# Patient Record
Sex: Male | Born: 1945 | Race: White | Hispanic: No | Smoking: Current every day smoker
Health system: Southern US, Community
[De-identification: ages and names within clinical notes are randomized; demographics above are authoritative.]

## PROBLEM LIST (undated history)

## (undated) DIAGNOSIS — Q6589 Other specified congenital deformities of hip: Secondary | ICD-10-CM

## (undated) DIAGNOSIS — M1990 Unspecified osteoarthritis, unspecified site: Secondary | ICD-10-CM

## (undated) DIAGNOSIS — S0592XA Unspecified injury of left eye and orbit, initial encounter: Secondary | ICD-10-CM

## (undated) DIAGNOSIS — H409 Unspecified glaucoma: Secondary | ICD-10-CM

## (undated) DIAGNOSIS — J9819 Other pulmonary collapse: Secondary | ICD-10-CM

## (undated) DIAGNOSIS — J449 Chronic obstructive pulmonary disease, unspecified: Secondary | ICD-10-CM

## (undated) DIAGNOSIS — B029 Zoster without complications: Secondary | ICD-10-CM

## (undated) DIAGNOSIS — C801 Malignant (primary) neoplasm, unspecified: Secondary | ICD-10-CM

## (undated) DIAGNOSIS — K219 Gastro-esophageal reflux disease without esophagitis: Secondary | ICD-10-CM

## (undated) DIAGNOSIS — R0683 Snoring: Secondary | ICD-10-CM

## (undated) DIAGNOSIS — E039 Hypothyroidism, unspecified: Secondary | ICD-10-CM

## (undated) DIAGNOSIS — I10 Essential (primary) hypertension: Secondary | ICD-10-CM

## (undated) HISTORY — DX: Unspecified injury of left eye and orbit, initial encounter: S05.92XA

## (undated) HISTORY — DX: Essential (primary) hypertension: I10

## (undated) HISTORY — PX: EYE SURGERY: SHX253

## (undated) HISTORY — PX: LUNG SURGERY: SHX703

## (undated) HISTORY — DX: Other specified congenital deformities of hip: Q65.89

## (undated) HISTORY — DX: Zoster without complications: B02.9

## (undated) HISTORY — DX: Other pulmonary collapse: J98.19

## (undated) HISTORY — DX: Unspecified osteoarthritis, unspecified site: M19.90

## (undated) HISTORY — DX: Snoring: R06.83

## (undated) HISTORY — DX: Unspecified glaucoma: H40.9

## (undated) HISTORY — DX: Gastro-esophageal reflux disease without esophagitis: K21.9

---

## 1978-06-09 DIAGNOSIS — J9819 Other pulmonary collapse: Secondary | ICD-10-CM

## 1978-06-09 HISTORY — DX: Other pulmonary collapse: J98.19

## 2007-05-20 ENCOUNTER — Ambulatory Visit: Payer: Self-pay | Admitting: Internal Medicine

## 2007-05-20 ENCOUNTER — Inpatient Hospital Stay (HOSPITAL_COMMUNITY): Admission: EM | Admit: 2007-05-20 | Discharge: 2007-05-21 | Payer: Self-pay | Admitting: Emergency Medicine

## 2007-05-21 ENCOUNTER — Ambulatory Visit: Payer: Self-pay | Admitting: Vascular Surgery

## 2007-05-21 ENCOUNTER — Encounter (INDEPENDENT_AMBULATORY_CARE_PROVIDER_SITE_OTHER): Payer: Self-pay | Admitting: Internal Medicine

## 2010-10-22 NOTE — H&P (Signed)
Donald Larson, Donald Larson               ACCOUNT NO.:  [id number]   MEDICAL RECORD NO.:  [id number]          PATIENT TYPE:  EMS   LOCATION:  MAJO                         FACILITY:  [facility]   PHYSICIAN:  Marcellus Scott, MD     DATE OF BIRTH:  01-28-1946   DATE OF ADMISSION:  05/20/2007  DATE OF DISCHARGE:                              HISTORY & PHYSICAL   PRIMARY MEDICAL DOCTOR:  Unassigned.   CHIEF COMPLAINT:  1. Passing out.  2. Question chest pain.   HISTORY OF PRESENTING ILLNESS:  Donald Larson is a 65 year old pleasant  Caucasian male patient with no past medical history.  He was in his  usual state of health until approximately 63-4:30 p.m. this afternoon.  While he was at his niece's place, the patient was sitting on a stool  when he felt dizzy, light-headed, with some lower retrosternal chest  pain.  Following this, soon afterward the patient passed out and fell to  the ground, when he hit his left cheek.  There was a chronic swelling on  that left cheek which is said to have burst with some bleeding.  The  patient woke up within a few minutes.  The chest pain had subsided.  There was no diaphoresis, no asymmetrical limb weakness or change in  speech.  The patient then requested his niece and her son to walk him  outside for some fresh air.  While on the porch, the patient again felt  dizzy and collapsed, when he hit his tailbone.  He was out for a couple  of minutes.  When he woke up, he felt weak.  EMS was called, and the  patient was brought in.  There were no orthostatics recorded.  However,  his blood sugars were 69 mg/dL.  The patient at this time says he is  feeling much better, with no lightheadedness, dizziness, chest pain,  palpitations, diaphoresis, nausea, or vomiting.  He denies any  asymmetrical limb weakness or headache.  He does complain of pain in the  tailbone.   The patient indicates that he had had two beers this morning.  However,  he had had a regular breakfast  and lunch, too.  The patient also had  four beers last night.   PAST MEDICAL HISTORY:  None.   PAST SURGICAL HISTORY:  Left eye surgery secondary to metal pieces in  the left eye.  The patient is blind in the left eye.   ALLERGIES:  No known drug allergies.   MEDICATIONS:  None.   FAMILY HISTORY:  Sister with history of congestive heart failure,  coronary artery disease.  Brother with history of diabetes.  Mother and  sister with history of diabetes.  Strong family history of cancer,  including throat, lung cancers.   SOCIAL HISTORY:  The patient is widowed.  He is a Corporate investment banker.  He smokes 3-4 packs per day for decades.  The patient claims he drinks  only on weekends, which is 10-12, 16-ounce beers per day on Fridays and  Saturdays.  He claims sometimes he also uses liquor.  Though he claims  he drinks only on weekends, now he says he had four beers yesterday and  two this morning.  There is no history of drug abuse.   REVIEW OF SYSTEMS:  Fourteen systems were reviewed, and apart from  history of presenting illness, it is contributory from some chronic  nonproductive cough, no dyspnea, fever, chills, or rigors.   PHYSICAL EXAMINATION:  GENERAL:  Donald Larson is a moderately built and  nourished male patient in no obvious distress.  VITAL SIGNS:  Temperature 97.1 degrees Fahrenheit, blood pressure is  110/54 mmHg, pulse 60 per minute and regular, respirations 18 per  minute, saturating at 97% on room air.  HEENT:  Nontraumatic, normocephalic.  Right pupil is 3 mm, reacting to  light.  Left corneal opacity and blind.  The patient has a pedunculous  swelling on the left cheek underneath the eye which has a tiny cut and  is minimally oozing blood.  It is nontender.  Oral cavity with no  oropharyngeal erythema or thrush.  NECK:  Without JVD, carotid bruits, lymphadenopathy, or goiter.  Supple.  LYMPHATICS:  No lymphadenopathy.  RESPIRATORY:  Clear to auscultation  bilaterally.  CARDIOVASCULAR:  First and second heart sounds heard.  Soft.  No third  or fourth heart sounds or murmurs.  ABDOMEN:  Nontender, nondistended.  No organomegaly or mass appreciated.  Bowel sounds are normally heard.  CENTRAL NERVOUS SYSTEM:  The patient is awake, alert, oriented x3.  No  cranial nerve or focal neurological deficits.  EXTREMITIES:  With no cyanosis, clubbing, or edema.  Peripheral pulses  are symmetrically felt.  SKIN:  Without any rashes.  MUSCULOSKELETAL:  With minimal tenderness in the sacrococcygeal region,  but no deformities.   LABORATORY DATA:  Blood alcohol level is normal, less than 5.  The  patient's electrolytes on i-STAT:  Sodium 141, potassium 3.6, chloride  108, glucose 68, BUN 11, creatinine 1.  CBCs with hemoglobin 14.1,  hematocrit 42, white blood cells 10.8, platelets 194.  EKG is sinus  bradycardia, with rate of 58 beats per minute, with sinus arrhythmia.  PR and QT interval are normal.  No acute changes on EKG.   ASSESSMENT AND PLAN:  1. Syncope.  Unclear etiology.  Differential diagnosis is hypotension      secondary to alcohol use and associated dehydration, versus      vasovagal, versus hypoglycemia.  Will admit to telemetry.  Will      cycle cardiac enzymes.  Will obtain an echocardiogram to check LV      function, and also carotid Dopplers.  2. Chest pain.  Resolved.  Rule out acute coronary syndrome.  Will      cycle cardiac enzymes and place the patient on aspirin after the CT      head has been done and ruled out for a bleed.  Will also check an      echocardiogram.  3. Tobacco abuse.  For counseling.  The patient, however, indicates he      does not to quit.  For nicotine patch.  4. Alcohol dependence.  To monitor for withdrawal, and place the      patient on Ativan withdrawal protocol, thiamine, and multivitamin.      Marcellus Scott, MD  Electronically Signed     AH/MEDQ  D:  05/20/2007  T:  05/20/2007  Job:   [id number]

## 2010-10-25 NOTE — Discharge Summary (Signed)
[name], Donald Larson               ACCOUNT NO.:  [id number]   MEDICAL RECORD NO.:  [id number]          PATIENT TYPE:  INP   LOCATION:  4733                         FACILITY:  [facility]   PHYSICIAN:  Lucita Ferrara, MD         DATE OF BIRTH:  12/10/1945   DATE OF ADMISSION:  05/20/2007  DATE OF DISCHARGE:  05/21/2007                               DISCHARGE SUMMARY   DISCHARGE DIAGNOSES:  1. Syncope.  2. Chest pain.  3. Tobacco abuse.  4. Alcohol dependence.  5. Noncompliance.  6. Left hospital against medical advice.   CONSULTANTS:  None.   PROCEDURES:  1. 2-D echocardiogram.  2. Carotid Dopplers within normal limits, dated May 21, 2007.   BRIEF HISTORY OF PRESENT ILLNESS:  [name] is a 65 year old  Caucasian male who presented to [facility] on May 20, 2007, with dizziness, lightheadedness and passed out, fell to the  ground.  Apparently he had some swelling of his left cheek.  EMS was  called.  The patient was brought to the emergency room.  A detailed  workup was initiated.  Syncope workup was initiated with serial enzymes,  2-D echocardiogram.  For his chest pain the patient's serial enzymes  were initiated.  CT scan of the head was negative for bleed.  Echocardiogram was requested.  Tobacco abuse counseling was initiated.  The patient was put on an alcohol withdrawal protocol.  Prior to me  seeing the patient on May 21, 2007, when I took over the case, the  patient requested to take his IV out and stated to the nurse that, I  need to get out of here.  The patient was encouraged to stay to talk to  me.  Prior to me seeing him, examining him and finalizing his workup,  the patient left against medical advice on May 21, 2007, 1540  military time.      Lucita Ferrara, MD  Electronically Signed     RR/MEDQ  D:  08/25/2007  T:  08/25/2007  Job:  [id number]

## 2011-03-17 LAB — COMPREHENSIVE METABOLIC PANEL
Albumin: 3.4 — ABNORMAL LOW
Alkaline Phosphatase: 56
CO2: 25
Chloride: 111
GFR calc Af Amer: >60
GFR calc non Af Amer: >60
Sodium: 142
Total Protein: 6.1

## 2011-03-17 LAB — I-STAT 8, (EC8 V) (CONVERTED LAB)
Bicarbonate: 25.7 — ABNORMAL HIGH
Glucose, Bld: 68 — ABNORMAL LOW
Potassium: 3.6
Sodium: 141
TCO2: 27

## 2011-03-17 LAB — CARDIAC PANEL(CRET KIN+CKTOT+MB+TROPI)
CK, MB: 1.7
Relative Index: INVALID
Relative Index: INVALID
Total CK: 95

## 2011-03-17 LAB — CBC
HCT: 38.4 — ABNORMAL LOW
HCT: 42
Hemoglobin: 13.5
Hemoglobin: 14.1
MCV: 96.4
Platelets: 194
RBC: 4.09 — ABNORMAL LOW
WBC: 6.9

## 2011-03-17 LAB — LIPID PANEL: Cholesterol: 150

## 2011-03-17 LAB — DIFFERENTIAL
Eosinophils Absolute: 0.2
Monocytes Absolute: 0.8
Neutrophils Relative %: 81 — ABNORMAL HIGH

## 2011-03-17 LAB — TSH: TSH: 1.146

## 2011-03-17 LAB — ETHANOL: Alcohol, Ethyl (B): <5

## 2014-05-09 ENCOUNTER — Ambulatory Visit (INDEPENDENT_AMBULATORY_CARE_PROVIDER_SITE_OTHER): Payer: Medicare Other | Admitting: *Deleted

## 2014-05-09 ENCOUNTER — Ambulatory Visit (INDEPENDENT_AMBULATORY_CARE_PROVIDER_SITE_OTHER): Payer: Medicare Other | Admitting: Internal Medicine

## 2014-05-09 ENCOUNTER — Encounter: Payer: Self-pay | Admitting: Internal Medicine

## 2014-05-09 VITALS — BP 171/76 | HR 75 | Temp 98.4°F | Ht 71.0 in | Wt 151.5 lb

## 2014-05-09 DIAGNOSIS — M5441 Lumbago with sciatica, right side: Secondary | ICD-10-CM

## 2014-05-09 DIAGNOSIS — M5440 Lumbago with sciatica, unspecified side: Secondary | ICD-10-CM | POA: Diagnosis not present

## 2014-05-09 DIAGNOSIS — Z23 Encounter for immunization: Secondary | ICD-10-CM | POA: Diagnosis not present

## 2014-05-09 DIAGNOSIS — M5416 Radiculopathy, lumbar region: Secondary | ICD-10-CM

## 2014-05-09 DIAGNOSIS — M5442 Lumbago with sciatica, left side: Secondary | ICD-10-CM | POA: Diagnosis not present

## 2014-05-09 DIAGNOSIS — Z0000 Encounter for general adult medical examination without abnormal findings: Secondary | ICD-10-CM

## 2014-05-09 DIAGNOSIS — M5430 Sciatica, unspecified side: Secondary | ICD-10-CM | POA: Diagnosis not present

## 2014-05-09 DIAGNOSIS — G8929 Other chronic pain: Secondary | ICD-10-CM | POA: Insufficient documentation

## 2014-05-09 LAB — CBC
HEMATOCRIT: 39.1 % (ref 39.0–52.0)
Hemoglobin: 13.7 g/dL (ref 13.0–17.0)
MCH: 32.5 pg (ref 26.0–34.0)
MCHC: 35 g/dL (ref 30.0–36.0)
MCV: 92.9 fL (ref 78.0–100.0)
MPV: 9.8 fL (ref 9.4–12.4)
PLATELETS: 200 10*3/uL (ref 150–400)
RBC: 4.21 MIL/uL — ABNORMAL LOW (ref 4.22–5.81)
RDW: 14.1 % (ref 11.5–15.5)
WBC: 8 10*3/uL (ref 4.0–10.5)

## 2014-05-09 LAB — COMPLETE METABOLIC PANEL WITH GFR
ALT: 10 U/L (ref 0–53)
AST: 18 U/L (ref 0–37)
Albumin: 4 g/dL (ref 3.5–5.2)
Alkaline Phosphatase: 65 U/L (ref 39–117)
BILIRUBIN TOTAL: 0.4 mg/dL (ref 0.2–1.2)
BUN: 17 mg/dL (ref 6–23)
CHLORIDE: 104 meq/L (ref 96–112)
CO2: 30 mEq/L (ref 19–32)
CREATININE: 0.82 mg/dL (ref 0.50–1.35)
Calcium: 8.7 mg/dL (ref 8.4–10.5)
GFR, Est Non African American: >89 mL/min
Glucose, Bld: 103 mg/dL — ABNORMAL HIGH (ref 70–99)
POTASSIUM: 4.3 meq/L (ref 3.5–5.3)
SODIUM: 141 meq/L (ref 135–145)
Total Protein: 6.6 g/dL (ref 6.0–8.3)

## 2014-05-09 MED ORDER — OMEPRAZOLE 40 MG PO CPDR: 40.0000 mg | DELAYED_RELEASE_CAPSULE | Freq: Every day | ORAL | Status: DC

## 2014-05-09 MED ORDER — OXYCODONE HCL 10 MG PO TABS: 10.0000 mg | ORAL_TABLET | Freq: Three times a day (TID) | ORAL | Status: DC | PRN

## 2014-05-09 NOTE — Progress Notes (Addendum)
Patient ID: Donald Larson, male   DOB: [birth year], 68 y.o.   MRN: [id number]   Subjective:   Patient ID: Donald Larson male   DOB: 1946/03/31 67 y.o.   MRN: [id number]  HPI: Donald Larson is a 68 y.o. without past medical hx.  Presented today with chronic low back pain- started about 8 years agos which has been getting worse now affecting his sleep. Pt pain started 8 years ago when he was climbing a ladder with some heavy equipment when he turned suddenly and felt his back twist with a sound, since then patient has been having pain, pt still presently works in Architect. Pain is constant, severe, aggrav by walking long distances, climbing up stairs, has to stop and rest for some relief, this takes the pressure off his back. Weight has been stable, back and forth betwwen 10 pounds. Pt also has weakness of his lower extremities, sometimes has to hold on to something to walk. Also has shooting pains down both legs, worse on the right. Pt also has numbness of both legs, getting worse over the past 3-4 years. Pt also denies saddle anaethesia, bladder or bowlel problems. No fevers.  Pt has gotten pain pills from friends- ?10mg  he remembers, not sure of the name, helps for about 3- 4 hrs. Pt tried over the counter NSAIDs several times and they did not help,. So he stopped taking them. Tried some last week. Has not seen a doctor in 4 years, but was last admitted here in 2008 for syncope but he left AMA.  Pt endorses abdominal pain- 2-3 times a week, with nausea. Last threw up yesterday, can not remember the color did not check. Pain is intermitent. No known aggrav or relieving factors. No pain at this time. Stools are normal brown colour.  Pt did not seek medical care prior to this time, and is doing so now, because pt with the help of his sister who is present with him, just completed paper work for [employer].  No past medical history on file. No current outpatient prescriptions on file.   No  current facility-administered medications for this visit.   No family history on file. History   Social History  . Marital Status: Married    Spouse Name: N/A    Number of Children: N/A  . Years of Education: N/A   Social History Main Topics  . Smoking status: Current Every Day Smoker  . Smokeless tobacco: Not on file     Comment: 2PPD  . Alcohol Use: Not on file  . Drug Use: Not on file  . Sexual Activity: Not on file   Other Topics Concern  . Not on file   Social History Narrative  . No narrative on file   Review of Systems: CONSTITUTIONAL- No Fever, weightloss, night sweat or change in appetite. SKIN- No Rash, colour changes or itching. HEAD- No Headache or dizziness. EYES- Vision loss- left eye, had previous surgery that was not followed up on. EARS- No vertigo, hearing loss or ear discharge. Mouth/throat- No Sorethroat, dentures, or bleeding gums. RESPIRATORY- mild chronic Cough or no SOB. CARDIAC- No Palpitations, DOE, PND or chest pain. GI- No nausea, vomiting, diarrhoea, constipation, abd pain. URINARY- No Frequency, urgency, straining or dysuria. NEUROLOGIC- No Numbness, syncope, seizures or burning. [facility]- Denies depression or anxiety.  Objective:  Physical Exam: Filed Vitals:   05/09/14 1607  BP: 171/76  Pulse: 75  Temp: 98.4 F (36.9 C)  TempSrc: Oral  Height: 5'  11" (1.803 m)  Weight: 151 lb 8 oz (68.72 kg)  SpO2: 96%   GENERAL- alert, co-operative, appears as stated age, not in any distress. HEENT- Atraumatic, normocephalic, Left eye- Corneal opacity, with light perception only, oral mucosa appears moist, poor dentition overall, No carotid bruit, no cervical LN enlargement, thyroid does not appear enlarged, neck supple. CARDIAC- RRR, no murmurs, rubs or gallops. RESP- Moving equal volumes of air, and clear to auscultation bilaterally, no wheezes or crackles. ABDOMEN- Soft, nontender, no palpable masses or organomegaly, bowel sounds present. BACK-  Significant lumber kyphosis,  No tenderness along the vertebrae midline, some tenderness on palpation to the right of lumber spine,  NEURO- No obvious Cr N abnormality, strenght upper  5/5, Sensation intact- globally, DTRs- Patella- +2, Gait- kyphotic but normal. EXTREMITIES- pulse 2+, symmetric, no pedal edema. SKIN- Warm, dry, No rash or lesion. PSYCH- Normal mood and affect, appropriate thought content and speech.  Assessment & Plan:  The patient's case and plan of care was discussed with attending physician, Dr. Dareen Piano.  Please see problem based charting for assessment and plan.

## 2014-05-09 NOTE — Addendum Note (Signed)
Addended by: Bethena Roys on: 05/09/2014 06:26 PM   Modules accepted: Level of Service

## 2014-05-09 NOTE — Patient Instructions (Signed)
General Instructions: We want you to get this MRI done as soon as possible.   For now continue with the Oxycodone we are given you take One tablets as needed every 8 hours for pain.  Also take the medication for we prescribed for your stomach. Please do not take ibuprofen, Advil or Aleve, motrin or Naproxen any more.   Please bring your medicines with you each time you come to clinic.  Medicines may include prescription medications, over-the-counter medications, herbal remedies, eye drops, vitamins, or other pills.

## 2014-05-09 NOTE — Assessment & Plan Note (Signed)
Flu shot today.   Patient will like to come in for full work up after we have sorted out the issue with his back. Including lipid panel, Colonoscopy, Pneumovax and TDAp.

## 2014-05-09 NOTE — Assessment & Plan Note (Addendum)
Pt with hx and exam findings concerning for Disc prolapse/herniation, cord compression. No prior imaging. No alarm feats. Pain is worse at night interfering with sleep. Significant smoking hx. No weightloss. No alarm feats.   Plan- MRI spine - Oxycodone- 10mg  daily - will call in Gabapentin 300mg  BID. - Avoid NSAIDs. - CBC - CMET - Bp elevated today, but pt is in significant pain, recheck 2 more visits when acute issues resolved, pt might need BP meds. - Preferred pharm on [address].

## 2014-05-10 NOTE — Progress Notes (Signed)
INTERNAL MEDICINE TEACHING ATTENDING ADDENDUM - Jhanae Jaskowiak, MD: I reviewed and discussed at the time of visit with the resident Dr. Emokpae, the patient's medical history, physical examination, diagnosis and results of pertinent tests and treatment and I agree with the patient's care as documented.  

## 2014-05-24 ENCOUNTER — Telehealth: Payer: Self-pay | Admitting: *Deleted

## 2014-06-13 ENCOUNTER — Ambulatory Visit (HOSPITAL_COMMUNITY)
Admission: RE | Admit: 2014-06-13 | Discharge: 2014-06-13 | Disposition: A | Discharge status: Home / Self Care | Payer: Medicare Other | Source: Ambulatory Visit | Attending: Internal Medicine | Admitting: Internal Medicine

## 2014-06-13 ENCOUNTER — Other Ambulatory Visit: Payer: Self-pay | Admitting: *Deleted

## 2014-06-13 DIAGNOSIS — M5137 Other intervertebral disc degeneration, lumbosacral region: Secondary | ICD-10-CM | POA: Insufficient documentation

## 2014-06-13 DIAGNOSIS — M5136 Other intervertebral disc degeneration, lumbar region: Secondary | ICD-10-CM | POA: Diagnosis not present

## 2014-06-13 DIAGNOSIS — M5441 Lumbago with sciatica, right side: Secondary | ICD-10-CM

## 2014-06-13 DIAGNOSIS — M47896 Other spondylosis, lumbar region: Secondary | ICD-10-CM | POA: Diagnosis not present

## 2014-06-13 DIAGNOSIS — M5126 Other intervertebral disc displacement, lumbar region: Secondary | ICD-10-CM | POA: Insufficient documentation

## 2014-06-13 DIAGNOSIS — M47816 Spondylosis without myelopathy or radiculopathy, lumbar region: Secondary | ICD-10-CM | POA: Diagnosis not present

## 2014-06-13 DIAGNOSIS — G8929 Other chronic pain: Secondary | ICD-10-CM | POA: Diagnosis not present

## 2014-06-13 DIAGNOSIS — M5442 Lumbago with sciatica, left side: Secondary | ICD-10-CM

## 2014-06-13 DIAGNOSIS — R200 Anesthesia of skin: Secondary | ICD-10-CM | POA: Diagnosis not present

## 2014-06-13 DIAGNOSIS — M25551 Pain in right hip: Secondary | ICD-10-CM | POA: Diagnosis not present

## 2014-06-13 DIAGNOSIS — R531 Weakness: Secondary | ICD-10-CM | POA: Insufficient documentation

## 2014-06-13 DIAGNOSIS — M5144 Schmorl's nodes, thoracic region: Secondary | ICD-10-CM | POA: Diagnosis not present

## 2014-06-13 DIAGNOSIS — M2578 Osteophyte, vertebrae: Secondary | ICD-10-CM | POA: Diagnosis not present

## 2014-06-13 DIAGNOSIS — M545 Low back pain: Secondary | ICD-10-CM | POA: Diagnosis not present

## 2014-06-13 DIAGNOSIS — M4806 Spinal stenosis, lumbar region: Secondary | ICD-10-CM | POA: Insufficient documentation

## 2014-06-13 MED ORDER — OXYCODONE HCL 10 MG PO TABS: 10.0000 mg | ORAL_TABLET | Freq: Three times a day (TID) | ORAL | Status: DC | PRN

## 2014-06-13 NOTE — Telephone Encounter (Signed)
Given 10 pills to last until his appointment with me.

## 2014-06-13 NOTE — Telephone Encounter (Signed)
Pt left clinic 4PM - left message on home phone Rx for med was ready and clinic only open till 4:30PM. Also left message  he has appt with Dr Heber Sherman 06/15/13 3:15PM per Dr Heber Pearlington. Hilda Blades Aiyannah Fayad RN 06/13/14 4:05PM

## 2014-06-15 ENCOUNTER — Ambulatory Visit (INDEPENDENT_AMBULATORY_CARE_PROVIDER_SITE_OTHER): Payer: Medicare Other | Admitting: Internal Medicine

## 2014-06-15 ENCOUNTER — Encounter: Payer: Self-pay | Admitting: Internal Medicine

## 2014-06-15 ENCOUNTER — Other Ambulatory Visit: Payer: Self-pay | Admitting: Internal Medicine

## 2014-06-15 VITALS — BP 164/70 | HR 61 | Temp 97.9°F | Ht 70.0 in | Wt 151.6 lb

## 2014-06-15 DIAGNOSIS — M48061 Spinal stenosis, lumbar region without neurogenic claudication: Secondary | ICD-10-CM

## 2014-06-15 DIAGNOSIS — M5416 Radiculopathy, lumbar region: Secondary | ICD-10-CM

## 2014-06-15 DIAGNOSIS — G8929 Other chronic pain: Secondary | ICD-10-CM

## 2014-06-15 DIAGNOSIS — I10 Essential (primary) hypertension: Secondary | ICD-10-CM | POA: Diagnosis not present

## 2014-06-15 DIAGNOSIS — Z23 Encounter for immunization: Secondary | ICD-10-CM

## 2014-06-15 DIAGNOSIS — Z0000 Encounter for general adult medical examination without abnormal findings: Secondary | ICD-10-CM

## 2014-06-15 DIAGNOSIS — F17200 Nicotine dependence, unspecified, uncomplicated: Secondary | ICD-10-CM

## 2014-06-15 DIAGNOSIS — F17210 Nicotine dependence, cigarettes, uncomplicated: Secondary | ICD-10-CM | POA: Diagnosis not present

## 2014-06-15 MED ORDER — PNEUMOCOCCAL 13-VAL CONJ VACC IM SUSP
0.5000 mL | INTRAMUSCULAR | Status: AC
  Administered 2014-06-15: 0.5 mL via INTRAMUSCULAR

## 2014-06-15 MED ORDER — OXYCODONE-ACETAMINOPHEN 5-325 MG PO TABS: 1.0000 | ORAL_TABLET | Freq: Three times a day (TID) | ORAL | Status: DC | PRN

## 2014-06-15 MED ORDER — IBUPROFEN 800 MG PO TABS: 800.0000 mg | ORAL_TABLET | Freq: Three times a day (TID) | ORAL | Status: DC | PRN

## 2014-06-15 MED ORDER — HYDROCHLOROTHIAZIDE 25 MG PO TABS: 25.0000 mg | ORAL_TABLET | Freq: Every day | ORAL | Status: DC

## 2014-06-15 NOTE — Progress Notes (Signed)
[facility] Subjective:   Patient ID: Donald Larson male   DOB: 11/14/1945 69 y.o.   MRN: [id number]  HPI: Donald Larson is a 69 y.o. male with a PMH of low back pain presents for follow up. He recently established with our clinic 2 weeks ago after not seeing a physician in many years.  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  He notes that about 10-12 years ago while putting sidding on a house he fell off a later and hurt his back, he notes the back pain was initially responsive to NSAIDs like ibuprofen but over the years it became more resistant.  More recently over the past few years (maybe about 3-4) he notes that he has been having pain that shoots down his right leg to his toe that is very painful.  He would occasionally borrow other people's pain medication for relief until he finally went to see a doctor himself.  He reports the pain can but up to a 9/10, at his last visit he was given 20 pills of Oxycodone 10mg , he notes that does help and brings the pain down to a 5-6/10.  He has not tried NSAIDs again in some time due to lack of effectiveness. 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   He denies any foot drop, any loss of bowel or bladder function, any weakness in his foot.  He does not know of any other medical problems He has never been told he has high blood pressure.  He reports a family history of cancer but unsure what type  He smokes 2-3 ppd, started when he was 65.  No past medical history on file. Current Outpatient Prescriptions  Medication Sig Dispense Refill  . omeprazole (PRILOSEC) 40 MG capsule Take 1 capsule (40 mg total) by mouth daily. 30 capsule 2  . Oxycodone HCl 10 MG TABS Take 1 tablet (10 mg total) by mouth every 8 (eight) hours as needed. 10 tablet 0   No current  facility-administered medications for this visit.   No family history on file. History   Social History  . Marital Status: Married    Spouse Name: N/A    Number of Children: N/A  . Years of Education: N/A   Social History Main Topics  . Smoking status: Current Every Day Smoker -- 3.00 packs/day    Types: Cigarettes    Start date: 06/09/1958  . Smokeless tobacco: None     Comment: 2PPD  . Alcohol Use: None  . Drug Use: None  . Sexual Activity: None   Other Topics Concern  . None   Social History Narrative   Review of Systems: Review of Systems  Constitutional: Negative for fever, chills, weight loss and malaise/fatigue.  Eyes: Negative for blurred vision.  Respiratory: Negative for shortness of breath.   Cardiovascular: Negative for chest pain.  Gastrointestinal: Negative for abdominal pain.  Genitourinary: Negative for dysuria.  Musculoskeletal: Positive for back pain.  Neurological: Negative for headaches.  Psychiatric/Behavioral: Negative for depression and substance abuse.       Does admit occasional marijuana use     Objective:  Physical Exam: Filed Vitals:   06/15/14 1529  BP: 164/70  Pulse: 61  Temp: 97.9 F (36.6 C)  TempSrc: Oral  Height: 5\' 10"  (1.778 m)  Weight: 151 lb 9.6 oz (  68.765 kg)  SpO2: 98%  Physical Exam  Constitutional: He is oriented to person, place, and time and well-developed, well-nourished, and in no distress.  Cardiovascular: Normal rate, regular rhythm and normal heart sounds.   Pulmonary/Chest: Effort normal and breath sounds normal. No respiratory distress. He has no wheezes.  Abdominal: Soft. Bowel sounds are normal. There is no tenderness.  Musculoskeletal:       Lumbar back: He exhibits tenderness. He exhibits normal range of motion, no bony tenderness, no swelling and no edema.  Positive straight leg raise to 40 degrees on the right, negative on the left.  Able to stand on toes and heel. 5/5 muscle strength of hip, knee,  ankle bilaterally, sensation intact over L4-S1  dermatomes bilaterally  Neurological: He is alert and oriented to person, place, and time. He displays normal reflexes.  Psychiatric: Mood and affect normal.  Nursing note and vitals reviewed.   Assessment & Plan:  Case discussed with Dr. Eppie Gibson  Essential hypertension Patient has had chronic pain so his hypertension on the past two episdoes is unlikely to just be due to pain. - Will start HCTZ 25mg  per day. - Check BMP at next visit for K+  Tobacco use disorder  Assessment: Progress toward smoking cessation:    Barriers to progress toward smoking cessation:    Comments: current smoker, in precontemplation  Plan: Instruction/counseling given:  I counseled patient on the dangers of tobacco use, advised patient to stop smoking, and reviewed strategies to maximize success. Educational resources provided:  [employer] account manager) brochure Self management tools provided:    Medications to assist with smoking cessation:  None Patient agreed to the following self-care plans for smoking cessation:    Other plans: Let patient know of health risks and advised to let me know when he is ready to quit.    Preventative health care - Will need some work to get him up to date on his health maintenance. - Gave Prevnar 66 today, in 6 months will need pneumococcal. - Briefly discussed colonoscopy, will readdress at later visit. - Discussed the nature of prostate screening with DRE and lab values and the potential need for invasive sampling to follow up a positive test.  He declines prostate screening at this time.  Chronic radicular lumbar pain Patient clearly has a very chronic low back pain with radicular symptoms but does not have any emergent finding suggesting that he have urgent NeuroSx referral.  I disscussed with him his MRI findings as well as the nature of this disease. We agreed to try medical management at this time and hold off  on any NeuroSx consult.  We will try to improve his function status so that he can get outside more and do more work. - Given he has had some previous relief with Ibuprofen and has normal Kidney function will start Ibuprofen 800mg  TIDPRN for moderate pain - Only red flag behavior was borrowing medication which was likely secondary to untreated pain, Will start Percocet 5-325 Q8PRN for severe pain #90 per month (he had not picked up temporary script for #10 oxycodone yet and this was voided and destroyed). Patient signed Pain contract. - Lab had closed by end of visit. Will check UDS at next visit.     Medications Ordered Meds ordered this encounter  Medications  . pneumococcal 13-valent conjugate vaccine (PREVNAR 13) injection 0.5 mL    Sig:   . ibuprofen (ADVIL,MOTRIN) 800 MG tablet    Sig: Take 1 tablet (800 mg total)  by mouth every 8 (eight) hours as needed.    Dispense:  90 tablet    Refill:  1  . oxyCODONE-acetaminophen (ROXICET) 5-325 MG per tablet    Sig: Take 1 tablet by mouth every 8 (eight) hours as needed for severe pain.    Dispense:  90 tablet    Refill:  0    May fill on 06/15/13  . hydrochlorothiazide (HYDRODIURIL) 25 MG tablet    Sig: Take 1 tablet (25 mg total) by mouth daily.    Dispense:  30 tablet    Refill:  5   Other Orders No orders of the defined types were placed in this encounter.

## 2014-06-15 NOTE — Patient Instructions (Signed)
General Instructions:   Please bring your medicines with you each time you come to clinic.  Medicines may include prescription medications, over-the-counter medications, herbal remedies, eye drops, vitamins, or other pills.   Progress Toward Treatment Goals:  No flowsheet data found.  Self Care Goals & Plans:  Self Care Goal 06/15/2014  Manage my medications take my medicines as prescribed; bring my medications to every visit  Eat healthy foods drink diet soda or water instead of juice or soda; eat more vegetables; eat foods that are low in salt; eat baked foods instead of fried foods    No flowsheet data found.   Care Management & Community Referrals:  No flowsheet data found.    Hydrochlorothiazide, HCTZ capsules or tablets What is this medicine? HYDROCHLOROTHIAZIDE (hye droe klor oh THYE a zide) is a diuretic. It increases the amount of urine passed, which causes the body to lose salt and water. This medicine is used to treat high blood pressure. It is also reduces the swelling and water retention caused by various medical conditions, such as heart, liver, or kidney disease. This medicine may be used for other purposes; ask your health care provider or pharmacist if you have questions. COMMON BRAND NAME(S): Esidrix, Ezide, HydroDIURIL, Microzide, Oretic, Zide What should I tell my health care provider before I take this medicine? They need to know if you have any of these conditions: -diabetes -gout -immune system problems, like lupus -kidney disease or kidney stones -liver disease -pancreatitis -small amount of urine or difficulty passing urine -an unusual or allergic reaction to hydrochlorothiazide, sulfa drugs, other medicines, foods, dyes, or preservatives -pregnant or trying to get pregnant -breast-feeding How should I use this medicine? Take this medicine by mouth with a glass of water. Follow the directions on the prescription label. Take your medicine at regular  intervals. Remember that you will need to pass urine frequently after taking this medicine. Do not take your doses at a time of day that will cause you problems. Do not stop taking your medicine unless your doctor tells you to. Talk to your pediatrician regarding the use of this medicine in children. Special care may be needed. Overdosage: If you think you have taken too much of this medicine contact a poison control center or emergency room at once. NOTE: This medicine is only for you. Do not share this medicine with others. What if I miss a dose? If you miss a dose, take it as soon as you can. If it is almost time for your next dose, take only that dose. Do not take double or extra doses. What may interact with this medicine? -cholestyramine -colestipol -digoxin -dofetilide -lithium -medicines for blood pressure -medicines for diabetes -medicines that relax muscles for surgery -other diuretics -steroid medicines like prednisone or cortisone This list may not describe all possible interactions. Give your health care provider a list of all the medicines, herbs, non-prescription drugs, or dietary supplements you use. Also tell them if you smoke, drink alcohol, or use illegal drugs. Some items may interact with your medicine. What should I watch for while using this medicine? Visit your doctor or health care professional for regular checks on your progress. Check your blood pressure as directed. Ask your doctor or health care professional what your blood pressure should be and when you should contact him or her. You may need to be on a special diet while taking this medicine. Ask your doctor. Check with your doctor or health care professional if you get  an attack of severe diarrhea, nausea and vomiting, or if you sweat a lot. The loss of too much body fluid can make it dangerous for you to take this medicine. You may get drowsy or dizzy. Do not drive, use machinery, or do anything that needs  mental alertness until you know how this medicine affects you. Do not stand or sit up quickly, especially if you are an older patient. This reduces the risk of dizzy or fainting spells. Alcohol may interfere with the effect of this medicine. Avoid alcoholic drinks. This medicine may affect your blood sugar level. If you have diabetes, check with your doctor or health care professional before changing the dose of your diabetic medicine. This medicine can make you more sensitive to the sun. Keep out of the sun. If you cannot avoid being in the sun, wear protective clothing and use sunscreen. Do not use sun lamps or tanning beds/booths. What side effects may I notice from receiving this medicine? Side effects that you should report to your doctor or health care professional as soon as possible: -allergic reactions such as skin rash or itching, hives, swelling of the lips, mouth, tongue, or throat -changes in vision -chest pain -eye pain -fast or irregular heartbeat -feeling faint or lightheaded, falls -gout attack -muscle pain or cramps -pain or difficulty when passing urine -pain, tingling, numbness in the hands or feet -redness, blistering, peeling or loosening of the skin, including inside the mouth -unusually weak or tired Side effects that usually do not require medical attention (report to your doctor or health care professional if they continue or are bothersome): -change in sex drive or performance -dry mouth -headache -stomach upset This list may not describe all possible side effects. Call your doctor for medical advice about side effects. You may report side effects to FDA at 1-800-FDA-1088. Where should I keep my medicine? Keep out of the reach of children. Store at room temperature between 15 and 30 degrees C (59 and 86 degrees F). Do not freeze. Protect from light and moisture. Keep container closed tightly. Throw away any unused medicine after the expiration date. NOTE: This  sheet is a summary. It may not cover all possible information. If you have questions about this medicine, talk to your doctor, pharmacist, or health care provider.  2015, [employer]vier/[employer] Standard. (2010-01-18 12:57:37)

## 2014-06-16 ENCOUNTER — Encounter: Payer: Self-pay | Admitting: Internal Medicine

## 2014-06-16 NOTE — Assessment & Plan Note (Signed)
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  I disscussed with him his MRI findings as well as the nature of this disease. We agreed to try medical management at this time and hold off on any NeuroSx consult.  We will try to improve his function status so that he can get outside more and do more work. - Given he has had some previous relief with Ibuprofen and has normal Kidney function will start Ibuprofen 800mg  TIDPRN for moderate pain - Only red flag behavior was borrowing medication which was likely secondary to untreated pain, Will start Percocet 5-325 Q8PRN for severe pain #90 per month (he had not picked up temporary script for #10 oxycodone yet and this was voided and destroyed). Patient signed Pain contract. - Lab had closed by end of visit. Will check UDS at next visit.

## 2014-06-16 NOTE — Assessment & Plan Note (Signed)
Patient has had chronic pain so his hypertension on the past two episdoes is unlikely to just be due to pain. - Will start HCTZ 25mg  per day. - Check BMP at next visit for K+

## 2014-06-16 NOTE — Assessment & Plan Note (Signed)
-   Will need some work to get him up to date on his health maintenance. - Gave Prevnar 65 today, in 6 months will need pneumococcal. - Briefly discussed colonoscopy, will readdress at later visit. - Discussed the nature of prostate screening with DRE and lab values and the potential need for invasive sampling to follow up a positive test.  He declines prostate screening at this time.

## 2014-06-16 NOTE — Assessment & Plan Note (Signed)
  Assessment: Progress toward smoking cessation:    Barriers to progress toward smoking cessation:    Comments: current smoker, in precontemplation  Plan: Instruction/counseling given:  I counseled patient on the dangers of tobacco use, advised patient to stop smoking, and reviewed strategies to maximize success. Educational resources provided:  [employer] account manager) brochure Self management tools provided:    Medications to assist with smoking cessation:  None Patient agreed to the following self-care plans for smoking cessation:    Other plans: Let patient know of health risks and advised to let me know when he is ready to quit.

## 2014-06-19 NOTE — Progress Notes (Signed)
Case discussed with Dr. Hoffman at time of visit. We reviewed the resident's history and exam and pertinent patient test results. I agree with the assessment, diagnosis, and plan of care documented in the resident's note. 

## 2014-07-05 ENCOUNTER — Encounter: Payer: Self-pay | Admitting: Internal Medicine

## 2014-07-05 ENCOUNTER — Ambulatory Visit (INDEPENDENT_AMBULATORY_CARE_PROVIDER_SITE_OTHER): Payer: Medicare Other | Admitting: Internal Medicine

## 2014-07-05 VITALS — BP 152/76 | HR 74 | Temp 98.0°F | Ht 70.0 in | Wt 148.2 lb

## 2014-07-05 DIAGNOSIS — M5416 Radiculopathy, lumbar region: Secondary | ICD-10-CM

## 2014-07-05 DIAGNOSIS — Z720 Tobacco use: Secondary | ICD-10-CM

## 2014-07-05 DIAGNOSIS — R825 Elevated urine levels of drugs, medicaments and biological substances: Secondary | ICD-10-CM | POA: Diagnosis not present

## 2014-07-05 DIAGNOSIS — Z0000 Encounter for general adult medical examination without abnormal findings: Secondary | ICD-10-CM | POA: Diagnosis not present

## 2014-07-05 DIAGNOSIS — F17200 Nicotine dependence, unspecified, uncomplicated: Secondary | ICD-10-CM

## 2014-07-05 DIAGNOSIS — I10 Essential (primary) hypertension: Secondary | ICD-10-CM | POA: Diagnosis not present

## 2014-07-05 DIAGNOSIS — G8929 Other chronic pain: Secondary | ICD-10-CM | POA: Diagnosis not present

## 2014-07-05 LAB — BASIC METABOLIC PANEL WITH GFR
BUN: 21 mg/dL (ref 6–23)
CO2: 31 mEq/L (ref 19–32)
CREATININE: 0.78 mg/dL (ref 0.50–1.35)
Calcium: 9.2 mg/dL (ref 8.4–10.5)
Chloride: 103 mEq/L (ref 96–112)
GFR, Est Non African American: >89 mL/min
Glucose, Bld: 107 mg/dL — ABNORMAL HIGH (ref 70–99)
POTASSIUM: 3.4 meq/L — AB (ref 3.5–5.3)
Sodium: 140 mEq/L (ref 135–145)

## 2014-07-05 MED ORDER — OXYCODONE-ACETAMINOPHEN 10-325 MG PO TABS: 1.0000 | ORAL_TABLET | Freq: Four times a day (QID) | ORAL | Status: DC | PRN

## 2014-07-05 MED ORDER — MELOXICAM 7.5 MG PO TABS: 7.5000 mg | ORAL_TABLET | Freq: Every day | ORAL | Status: DC

## 2014-07-05 NOTE — Patient Instructions (Signed)
General Instructions: You should hear from [place] about your appointment with GI for a colonoscopy.  I want this pain medication to last the full month this time.  Stop taking Ibuprofen and start Meloxicam (Mobic)  Please bring your medicines with you each time you come to clinic.  Medicines may include prescription medications, over-the-counter medications, herbal remedies, eye drops, vitamins, or other pills.   Progress Toward Treatment Goals:  Treatment Goal 07/05/2014  Blood pressure improved  Stop smoking smoking the same amount    Self Care Goals & Plans:  Self Care Goal 07/05/2014  Manage my medications take my medicines as prescribed; bring my medications to every visit; refill my medications on time; follow the sick day instructions if I am sick  Eat healthy foods eat more vegetables; eat fruit for snacks and desserts  Be physically active find an activity I enjoy    No flowsheet data found.   Care Management & Community Referrals:  Referral 07/05/2014  Referrals made for care management support none needed

## 2014-07-05 NOTE — Progress Notes (Signed)
Internal Medicine Clinic Attending  Case discussed with Dr. Hoffman soon after the resident saw the patient.  We reviewed the resident's history and exam and pertinent patient test results.  I agree with the assessment, diagnosis, and plan of care documented in the resident's note. 

## 2014-07-05 NOTE — Progress Notes (Addendum)
[facility] Subjective:   Patient ID: Donald Larson male   DOB: 17-Jun-1945 69 y.o.   MRN: [id number]  HPI: Donald Larson is a 69 y.o. male with a PMH of HTN, and chronic low back pain who presents for follow up early due to running out of pain mediction  HTN: Has been taking HCTZ as directed, no issues with medication.    Low back pain: He reports he was trying to take ibuprofen 800mg  TID as directed but that it upset his stomach and he could not take that frequently.  He notes that the roxicet that i gave his was not as effective as the previous oxycodone he was getting (10mg ) and he frequently had to take 2 at a time.  This has resulted in him running out about a week ago (#90 pills lasted roughly 2 weeks).  He reports since that time he has been suffering, he denies taking other peoples medications (he had previously done this prior to establishing with our clinic).  He does admit to use of marijuana but denies other drugs.  GERD: Worsened by ibuprofen.  He has tried his sister's mobic in the past which he does not remember hurting his stomach.   No past medical history on file. Current Outpatient Prescriptions  Medication Sig Dispense Refill  . hydrochlorothiazide (HYDRODIURIL) 25 MG tablet Take 1 tablet (25 mg total) by mouth daily. 30 tablet 5  . ibuprofen (ADVIL,MOTRIN) 800 MG tablet Take 1 tablet (800 mg total) by mouth every 8 (eight) hours as needed. 90 tablet 1  . omeprazole (PRILOSEC) 40 MG capsule Take 1 capsule (40 mg total) by mouth daily. 30 capsule 2  . Oxycodone HCl 10 MG TABS Take 1 tablet (10 mg total) by mouth every 8 (eight) hours as needed. 10 tablet 0  . oxyCODONE-acetaminophen (ROXICET) 5-325 MG per tablet Take 1 tablet by mouth every 8 (eight) hours as needed for severe pain. 90 tablet 0   No current facility-administered medications for this visit.   Family History  Problem Relation Age of Onset  . Cancer Sister   . Cancer Maternal  Aunt    History   Social History  . Marital Status: Married    Spouse Name: N/A    Number of Children: N/A  . Years of Education: N/A   Social History Main Topics  . Smoking status: Current Every Day Smoker -- 3.00 packs/day    Types: Cigarettes    Start date: 06/09/1958  . Smokeless tobacco: None     Comment: 2PPD  . Alcohol Use: None  . Drug Use: None  . Sexual Activity: None   Other Topics Concern  . None   Social History Narrative   Review of Systems: Review of Systems  Constitutional: Negative for fever and chills.  Eyes: Negative for blurred vision.  Respiratory: Negative for cough.   Cardiovascular: Negative for chest pain.  Gastrointestinal: Positive for heartburn and abdominal pain. Negative for blood in stool and melena.  Musculoskeletal: Positive for back pain.  Skin: Negative for rash.  Neurological: Negative for headaches.  Psychiatric/Behavioral: Negative for depression and substance abuse.     Objective:  Physical Exam: Filed Vitals:   07/05/14 0920  BP: 152/76  Pulse: 74  Temp: 98 F (36.7 C)  TempSrc: Oral  Height: 5\' 10"  (1.778 m)  Weight: 148 lb 3.2 oz (67.223 kg)  SpO2: 98%  Physical Exam  Constitutional: He is well-developed, well-nourished, and in no distress.  Cardiovascular: Normal rate, regular rhythm and normal heart sounds.   Pulmonary/Chest: Effort normal and breath sounds normal.  Abdominal: Soft. Bowel sounds are normal. He exhibits no distension. There is no tenderness.  Musculoskeletal: He exhibits no edema.  Psychiatric: Affect normal.  Nursing note and vitals reviewed.   Assessment & Plan:  Case discussed with Dr. Lynnae January  Essential hypertension BP Readings from Last 3 Encounters:  07/05/14 152/76  06/15/14 164/70  05/09/14 171/76    Lab Results  Component Value Date   NA 140 07/05/2014   K 3.4* 07/05/2014   CREATININE 0.78 07/05/2014    Assessment: Blood pressure control: mildly elevated Progress toward BP  goal:  improved Comments: Goal <150/90  Plan: Medications:  Initially planned to continue HCTZ 25mg , however given hypokalemia on BMP will change to triamterene-HCTZ, called and left message for patient. Educational resources provided: brochure, handout Self management tools provided:   Other plans: obtained BMP for K+ eval.    Tobacco use disorder  Assessment: Progress toward smoking cessation:  smoking the same amount Barriers to progress toward smoking cessation:  lack of motivation to quit Comments:   Plan: Instruction/counseling given:  I counseled patient on the dangers of tobacco use, advised patient to stop smoking, and reviewed strategies to maximize success. Educational resources provided:  [employer] account manager) brochure Self management tools provided:    Medications to assist with smoking cessation:  None Patient agreed to the following self-care plans for smoking cessation:    Other plans:      Preventative health care -referral for screening colonoscopy.   Chronic radicular lumbar pain - Patient signed pain contract on last encounter. Has run out of pain medication early (I tried to reduce him for Oxycodone 10mg  q8 to Oxycodone-APAP 5-325mg ). - He reports to me he has not taking his narcotic pain medication in 1 week so I expect his UDS to be negative. - Will give 1 month prescription of Oxycodone-APAP 10-325mg  q8 #90. - Will d/c Ibuprofen and Start 7.5mg  of Mobic daily for NSAID. - Consider gabapentin for neuropathic component at next visit, and I briefly discussed low back injections at this encounter (patient did not seem interested) will readdress at next visit.  ADDENDUM: UDS positive for oxycodone, hydrocodone, and tramadol. Suspect he is finding alternative sources for his pain control.  I feel that he may be better served in a more regulated environment in a pain clinic.  Will place referral.     Medications Ordered Meds ordered this  encounter  Medications  . meloxicam (MOBIC) 7.5 MG tablet    Sig: Take 1 tablet (7.5 mg total) by mouth daily.    Dispense:  30 tablet    Refill:  2  . DISCONTD: oxyCODONE-acetaminophen (PERCOCET) 10-325 MG per tablet    Sig: Take 1 tablet by mouth every 6 (six) hours as needed for pain.    Dispense:  100 tablet    Refill:  0  . DISCONTD: oxyCODONE-acetaminophen (PERCOCET) 10-325 MG per tablet    Sig: Take 1 tablet by mouth every 6 (six) hours as needed for pain.    Dispense:  100 tablet    Refill:  0    Patient may fill early on January 27th 2016.  Marland Kitchen oxyCODONE-acetaminophen (PERCOCET) 10-325 MG per tablet    Sig: Take 1 tablet by mouth every 6 (six) hours as needed for pain.    Dispense:  100 tablet    Refill:  0    Patient may fill early on  January 27th 2016.  . triamterene-hydrochlorothiazide (MAXZIDE-25) 37.5-25 MG per tablet    Sig: Take 1 tablet by mouth daily.    Dispense:  30 tablet    Refill:  11    D/C perscription for HCTZ alone.   Other Orders Orders Placed This Encounter  Procedures  . BMP with Estimated GFR (CPT-80048)  . Prescription Abuse Monitoring, 15 Panel  . Opiates/Opioids (LC/MS-MS)  . Cannabanoids (THCA) (GC/LC/MS), urine  . Oxycodone, Urine (LC/MS-MS)  . Tramadol, urine  . Ambulatory referral to Gastroenterology    Referral Priority:  Routine    Referral Type:  Consultation    Referral Reason:  Specialty Services Required    Requested Specialty:  Gastroenterology    Number of Visits Requested:  1

## 2014-07-06 LAB — PRESCRIPTION ABUSE MONITORING 15P, URINE
Amphetamine/Meth: NEGATIVE ng/mL
Barbiturate Screen, Urine: NEGATIVE ng/mL
Benzodiazepine Screen, Urine: NEGATIVE ng/mL
Buprenorphine, Urine: NEGATIVE ng/mL
Carisoprodol, Urine: NEGATIVE ng/mL
Cocaine Metabolites: NEGATIVE ng/mL
Creatinine, Urine: 271.65 mg/dL (ref >20.0)
Fentanyl, Ur: NEGATIVE ng/mL
METHADONE SCREEN, URINE: NEGATIVE ng/mL
Meperidine, Ur: NEGATIVE ng/mL
Propoxyphene: NEGATIVE ng/mL
Zolpidem, Urine: NEGATIVE ng/mL

## 2014-07-06 MED ORDER — TRIAMTERENE-HCTZ 37.5-25 MG PO TABS: 1.0000 | ORAL_TABLET | Freq: Every day | ORAL | Status: DC

## 2014-07-06 NOTE — Assessment & Plan Note (Signed)
  Assessment: Progress toward smoking cessation:  smoking the same amount Barriers to progress toward smoking cessation:  lack of motivation to quit Comments:   Plan: Instruction/counseling given:  I counseled patient on the dangers of tobacco use, advised patient to stop smoking, and reviewed strategies to maximize success. Educational resources provided:  [employer] account manager) brochure Self management tools provided:    Medications to assist with smoking cessation:  None Patient agreed to the following self-care plans for smoking cessation:    Other plans:

## 2014-07-06 NOTE — Assessment & Plan Note (Signed)
BP Readings from Last 3 Encounters:  07/05/14 152/76  06/15/14 164/70  05/09/14 171/76    Lab Results  Component Value Date   NA 140 07/05/2014   K 3.4* 07/05/2014   CREATININE 0.78 07/05/2014    Assessment: Blood pressure control: mildly elevated Progress toward BP goal:  improved Comments: Goal <150/90  Plan: Medications:  Initially planned to continue HCTZ 25mg , however given hypokalemia on BMP will change to triamterene-HCTZ, called and left message for patient. Educational resources provided: brochure, handout Self management tools provided:   Other plans: obtained BMP for K+ eval.

## 2014-07-06 NOTE — Assessment & Plan Note (Signed)
-  referral for screening colonoscopy.

## 2014-07-06 NOTE — Assessment & Plan Note (Addendum)
-   Patient signed pain contract on last encounter. Has run out of pain medication early (I tried to reduce him for Oxycodone 10mg  q8 to Oxycodone-APAP 5-325mg ). - He reports to me he has not taking his narcotic pain medication in 1 week so I expect his UDS to be negative. - Will give 1 month prescription of Oxycodone-APAP 10-325mg  q8 #90. - Will d/c Ibuprofen and Start 7.5mg  of Mobic daily for NSAID. - Consider gabapentin for neuropathic component at next visit, and I briefly discussed low back injections at this encounter (patient did not seem interested) will readdress at next visit.  ADDENDUM: UDS positive for oxycodone, hydrocodone, and tramadol. Suspect he is finding alternative sources for his pain control.  I feel that he may be better served in a more regulated environment in a pain clinic.  Will place referral.

## 2014-07-07 ENCOUNTER — Encounter: Payer: Self-pay | Admitting: Internal Medicine

## 2014-07-10 LAB — OXYCODONE, URINE (LC/MS-MS)
NOROXYCODONE, UR: 3664 ng/mL — AB (ref <50)
OXYMORPHONE, URINE: 217 ng/mL — AB (ref <50)
Oxycodone, ur: 245 ng/mL — ABNORMAL HIGH (ref <50)

## 2014-07-10 LAB — CANNABANOIDS (GC/LC/MS), URINE: THC-COOH (GC/LC/MS), ur confirm: 652 ng/mL — ABNORMAL HIGH (ref <5)

## 2014-07-10 LAB — OPIATES/OPIOIDS (LC/MS-MS)
Codeine Urine: NEGATIVE ng/mL (ref <50)
Hydrocodone: 773 ng/mL — ABNORMAL HIGH (ref <50)
Hydromorphone: 132 ng/mL — ABNORMAL HIGH (ref <50)
Morphine Urine: NEGATIVE ng/mL (ref <50)
Norhydrocodone, Ur: 3333 ng/mL — ABNORMAL HIGH (ref <50)
Noroxycodone, Ur: 3664 ng/mL — ABNORMAL HIGH (ref <50)
OXYMORPHONE, URINE: 217 ng/mL — AB (ref <50)
Oxycodone, ur: 245 ng/mL — ABNORMAL HIGH (ref <50)

## 2014-07-10 LAB — TRAMADOL, URINE
N-DESMETHYL-CIS-TRAMADOL: 759 ng/mL — ABNORMAL HIGH (ref <100)
TRAMADOL, URINE: 2702 ng/mL — AB (ref <100)

## 2014-07-12 NOTE — Addendum Note (Signed)
Addended by: Joni Reining C on: 07/12/2014 11:14 AM   Modules accepted: Orders

## 2014-07-21 ENCOUNTER — Ambulatory Visit (AMBULATORY_SURGERY_CENTER): Payer: Self-pay | Admitting: *Deleted

## 2014-07-21 VITALS — Ht 70.0 in | Wt 149.8 lb

## 2014-07-21 DIAGNOSIS — Z1211 Encounter for screening for malignant neoplasm of colon: Secondary | ICD-10-CM

## 2014-07-21 MED ORDER — MOVIPREP 100 G PO SOLR: 1.0000 | Freq: Once | ORAL | Status: DC

## 2014-07-21 NOTE — Progress Notes (Signed)
No eggs or soy allergy No problems with past sedation No diet pills No home 02 use No email Heavy smoker-2-3 packs a day, congested cough noted in PV Sister Zigmund Daniel in [facility] with pt today, instructions given to pt and sister, questions asked and encouraged to call if any questions orr issues

## 2014-08-02 ENCOUNTER — Telehealth: Payer: Self-pay | Admitting: Internal Medicine

## 2014-08-02 NOTE — Telephone Encounter (Signed)
Call to patient to confirm appointment for 08/03/14 at 1:45 lmtcb

## 2014-08-03 ENCOUNTER — Telehealth: Payer: Self-pay | Admitting: Internal Medicine

## 2014-08-03 ENCOUNTER — Encounter: Payer: Medicare Other | Admitting: Internal Medicine

## 2014-08-03 NOTE — Telephone Encounter (Signed)
Charge late cancellation. Do not reschedule without OV first.

## 2014-08-04 ENCOUNTER — Encounter: Payer: Medicare Other | Admitting: Internal Medicine

## 2014-08-06 NOTE — Addendum Note (Signed)
Addended by: Hulan Fray on: 08/06/2014 01:34 PM   Modules accepted: Orders

## 2014-08-22 NOTE — Telephone Encounter (Signed)
Pt not billed due to the insurance.

## 2014-09-18 ENCOUNTER — Encounter: Payer: Self-pay | Admitting: *Deleted

## 2014-10-18 NOTE — Addendum Note (Signed)
Addended by: Hulan Fray on: 10/18/2014 05:53 PM   Modules accepted: Orders

## 2014-11-23 ENCOUNTER — Encounter: Payer: Self-pay | Admitting: Physical Medicine & Rehabilitation

## 2014-11-23 ENCOUNTER — Ambulatory Visit (INDEPENDENT_AMBULATORY_CARE_PROVIDER_SITE_OTHER): Payer: Medicare Other | Admitting: Internal Medicine

## 2014-11-23 ENCOUNTER — Encounter: Payer: Self-pay | Admitting: Internal Medicine

## 2014-11-23 VITALS — BP 144/80 | HR 63 | Temp 98.1°F | Ht 70.0 in | Wt 150.5 lb

## 2014-11-23 DIAGNOSIS — Z0000 Encounter for general adult medical examination without abnormal findings: Secondary | ICD-10-CM | POA: Diagnosis not present

## 2014-11-23 DIAGNOSIS — R05 Cough: Secondary | ICD-10-CM | POA: Diagnosis not present

## 2014-11-23 DIAGNOSIS — R059 Cough, unspecified: Secondary | ICD-10-CM

## 2014-11-23 DIAGNOSIS — F17210 Nicotine dependence, cigarettes, uncomplicated: Secondary | ICD-10-CM

## 2014-11-23 DIAGNOSIS — I10 Essential (primary) hypertension: Secondary | ICD-10-CM | POA: Diagnosis not present

## 2014-11-23 DIAGNOSIS — F17200 Nicotine dependence, unspecified, uncomplicated: Secondary | ICD-10-CM

## 2014-11-23 DIAGNOSIS — G8929 Other chronic pain: Secondary | ICD-10-CM | POA: Diagnosis not present

## 2014-11-23 DIAGNOSIS — M5416 Radiculopathy, lumbar region: Secondary | ICD-10-CM

## 2014-11-23 LAB — BASIC METABOLIC PANEL WITH GFR
BUN: 18 mg/dL (ref 6–23)
CO2: 27 mEq/L (ref 19–32)
Calcium: 8.7 mg/dL (ref 8.4–10.5)
Chloride: 104 mEq/L (ref 96–112)
Creat: 0.81 mg/dL (ref 0.50–1.35)
GFR, Est African American: >89 mL/min
Glucose, Bld: 87 mg/dL (ref 70–99)
POTASSIUM: 4.1 meq/L (ref 3.5–5.3)
SODIUM: 140 meq/L (ref 135–145)

## 2014-11-23 MED ORDER — HYDROCHLOROTHIAZIDE 12.5 MG PO TABS: 12.5000 mg | ORAL_TABLET | Freq: Every day | ORAL | Status: DC
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
## 2014-11-23 NOTE — Patient Instructions (Signed)
General Instructions:  We will work on getting you back in with the GI doctor and Pain center.  Please resume taking Hydrochlorothiazide 12.5mg  daily.  Please bring your medicines with you each time you come to clinic.  Medicines may include prescription medications, over-the-counter medications, herbal remedies, eye drops, vitamins, or other pills.   Progress Toward Treatment Goals:  Treatment Goal 07/05/2014  Blood pressure improved  Stop smoking smoking the same amount    Self Care Goals & Plans:  Self Care Goal 07/05/2014  Manage my medications take my medicines as prescribed; bring my medications to every visit; refill my medications on time; follow the sick day instructions if I am sick  Eat healthy foods eat more vegetables; eat fruit for snacks and desserts  Be physically active find an activity I enjoy    No flowsheet data found.   Care Management & Community Referrals:  Referral 07/05/2014  Referrals made for care management support none needed

## 2014-11-23 NOTE — Assessment & Plan Note (Signed)
-   No desire to quit or cut down.

## 2014-11-23 NOTE — Assessment & Plan Note (Signed)
-   Missed GI appointment as he was in prison. Will try to assist getting new appointment. - He has never had Hep C or HIV screening will complete these.

## 2014-11-23 NOTE — Assessment & Plan Note (Signed)
-   Reports pain is roughly unchanged.  Previously violated pain contract. Referred to pain management but apparently went to prison. Will need to see if they will still accept him. - Continue mobic.

## 2014-11-23 NOTE — Assessment & Plan Note (Signed)
BP Readings from Last 3 Encounters:  11/23/14 144/80  07/05/14 152/76  06/15/14 164/70    Lab Results  Component Value Date   NA 140 07/05/2014   K 3.4* 07/05/2014   CREATININE 0.78 07/05/2014    Assessment: Blood pressure control: mildly elevated Progress toward BP goal:  unchanged Comments: has been off medication  Plan: Medications:  HCTZ 12.5mg  dialy Educational resources provided:   Self management tools provided:   Other plans: check bmp, will reduce BP meds to 12.5mg  of HCTZ since patient is mildly elevated off BP meds (suspect lower salt diet in prison)

## 2014-11-24 LAB — HIV ANTIBODY (ROUTINE TESTING W REFLEX): HIV 1&2 Ab, 4th Generation: NONREACTIVE

## 2014-11-24 LAB — HEPATITIS C ANTIBODY: HCV AB: NEGATIVE

## 2014-11-28 NOTE — Progress Notes (Signed)
Internal Medicine Clinic Attending  Case discussed with Dr. Hoffman soon after the resident saw the patient.  We reviewed the resident's history and exam and pertinent patient test results.  I agree with the assessment, diagnosis, and plan of care documented in the resident's note. 

## 2014-12-28 ENCOUNTER — Encounter: Payer: Self-pay | Admitting: Physical Medicine & Rehabilitation

## 2015-01-01 ENCOUNTER — Ambulatory Visit (HOSPITAL_BASED_OUTPATIENT_CLINIC_OR_DEPARTMENT_OTHER): Payer: Medicare Other | Admitting: Physical Medicine & Rehabilitation

## 2015-01-01 ENCOUNTER — Encounter: Payer: Medicare Other | Attending: Physical Medicine & Rehabilitation

## 2015-01-01 ENCOUNTER — Encounter: Payer: Self-pay | Admitting: Physical Medicine & Rehabilitation

## 2015-01-01 VITALS — BP 148/84 | HR 64 | Resp 14

## 2015-01-01 DIAGNOSIS — M48062 Spinal stenosis, lumbar region with neurogenic claudication: Secondary | ICD-10-CM | POA: Insufficient documentation

## 2015-01-01 DIAGNOSIS — M545 Low back pain: Secondary | ICD-10-CM | POA: Insufficient documentation

## 2015-01-01 DIAGNOSIS — M47896 Other spondylosis, lumbar region: Secondary | ICD-10-CM

## 2015-01-01 DIAGNOSIS — M4806 Spinal stenosis, lumbar region: Secondary | ICD-10-CM | POA: Diagnosis not present

## 2015-01-01 MED ORDER — GABAPENTIN 300 MG PO CAPS: 300.0000 mg | ORAL_CAPSULE | Freq: Three times a day (TID) | ORAL | Status: DC

## 2015-01-01 MED ORDER — TRAMADOL HCL 50 MG PO TABS: 50.0000 mg | ORAL_TABLET | Freq: Four times a day (QID) | ORAL | Status: DC | PRN

## 2015-01-01 NOTE — Patient Instructions (Signed)
Physical therapy will call you to set up some appointments.  Next visit with me you will need a driver, we will do injections at that time  We discussed the medial branch blocks or facet injections which will predict whether or not the radiofrequency procedure may be of benefit
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
## 2015-01-15 ENCOUNTER — Encounter: Payer: Self-pay | Admitting: Physical Therapy

## 2015-01-15 ENCOUNTER — Ambulatory Visit: Payer: Medicare Other | Attending: Physical Medicine & Rehabilitation | Admitting: Physical Therapy

## 2015-01-15 DIAGNOSIS — R262 Difficulty in walking, not elsewhere classified: Secondary | ICD-10-CM | POA: Insufficient documentation

## 2015-01-15 DIAGNOSIS — M545 Low back pain: Secondary | ICD-10-CM
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
## 2015-01-15 NOTE — Patient Instructions (Signed)
Knee-to-Chest: with Neck Flexion Stretch (Supine)   Pull left knee to chest, tucking chin and lifting head. Hold __10__ seconds. Relax. Repeat _10___ times per set. Do _2___ sets per session. Do __2__ sessions per day.  Trunk: Knees to Chest   Lie on firm, flat surface. Keep head and shoulders flat on surface. Tuck hands behind knees and pull to chest. Hold _10___ seconds. Repeat __10__ times. Do _2___ sessions per day. CAUTION: Movement should be gentle and slow.  Caudal Rotation: Hip Roll, Neutral Lordosis - Supine   Lie with knees bent and slightly elevated, feet flat. Tighten stomach, lower knees out to right side, rotating hips and trunk. Keep stomach tight for return. Repeat _10___ times per set. Do __2__ sets per session. Do _2___ sessions per week.  Pelvic Tilt: Anterior - Legs Bent (Supine)   Rotate pelvis up and arch back. Hold ____ seconds. Relax. Repeat ____ times per set. Do ____ sets per session. Do ____ sessions per day.  Piriformis Stretch   Lying on back, pull right knee toward opposite shoulder. Hold __30__ seconds. Repeat _4___ times. Do _2___ sessions per day.  

## 2015-01-17 ENCOUNTER — Encounter: Payer: Self-pay | Admitting: Physical Therapy

## 2015-01-17 ENCOUNTER — Ambulatory Visit: Payer: Medicare Other | Admitting: Physical Therapy

## 2015-01-17 DIAGNOSIS — M545 Low back pain: Secondary | ICD-10-CM

## 2015-01-17 DIAGNOSIS — R262 Difficulty in walking, not elsewhere classified: Secondary | ICD-10-CM | POA: Diagnosis not present
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
## 2015-01-24 ENCOUNTER — Encounter: Payer: Self-pay | Admitting: Physical Therapy

## 2015-01-24 ENCOUNTER — Ambulatory Visit: Payer: Medicare Other | Admitting: Physical Therapy

## 2015-01-24 DIAGNOSIS — M545 Low back pain: Secondary | ICD-10-CM | POA: Diagnosis not present

## 2015-01-24 DIAGNOSIS — R262 Difficulty in walking, not elsewhere classified: Secondary | ICD-10-CM | POA: Diagnosis not present
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
## 2015-02-01 ENCOUNTER — Telehealth: Payer: Self-pay | Admitting: Internal Medicine

## 2015-02-01 ENCOUNTER — Ambulatory Visit: Payer: Medicare Other | Admitting: Internal Medicine

## 2015-02-01 NOTE — Telephone Encounter (Signed)
No charge. 

## 2015-02-02 ENCOUNTER — Ambulatory Visit: Payer: Medicare Other | Admitting: Internal Medicine

## 2015-02-05 ENCOUNTER — Ambulatory Visit: Payer: Medicare Other | Admitting: Internal Medicine

## 2015-02-06 ENCOUNTER — Telehealth: Payer: Self-pay | Admitting: Internal Medicine

## 2015-02-06 NOTE — Telephone Encounter (Signed)
Cal to patient to confirm appointment for 02/07/15 at 1:15 lmtcb

## 2015-02-07 ENCOUNTER — Encounter: Payer: Self-pay | Admitting: Internal Medicine

## 2015-02-07 ENCOUNTER — Ambulatory Visit (INDEPENDENT_AMBULATORY_CARE_PROVIDER_SITE_OTHER): Payer: Medicare Other | Admitting: Internal Medicine

## 2015-02-07 VITALS — BP 153/81 | HR 77 | Temp 98.2°F | Ht 70.0 in | Wt 144.2 lb

## 2015-02-07 DIAGNOSIS — F17210 Nicotine dependence, cigarettes, uncomplicated: Secondary | ICD-10-CM | POA: Diagnosis not present

## 2015-02-07 DIAGNOSIS — G8929 Other chronic pain: Secondary | ICD-10-CM | POA: Diagnosis not present

## 2015-02-07 DIAGNOSIS — M47816 Spondylosis without myelopathy or radiculopathy, lumbar region: Secondary | ICD-10-CM

## 2015-02-07 DIAGNOSIS — M5416 Radiculopathy, lumbar region: Secondary | ICD-10-CM

## 2015-02-07 DIAGNOSIS — I10 Essential (primary) hypertension: Secondary | ICD-10-CM

## 2015-02-07 MED ORDER — DICLOFENAC SODIUM 1 % TD GEL: 2.0000 g | Freq: Four times a day (QID) | TRANSDERMAL | Status: DC

## 2015-02-07 NOTE — Patient Instructions (Signed)
Mr. Marple it was nice meeting you today. Please apply Voltaren gel to lower back 4 times a day. In addition, please remember to take your blood pressure medication daily. I have referred you to Neurosurgery today. You have an appointment with Dr. Elnita Maxwell tomorrow 02/08/15 at 1:15 pm.

## 2015-02-08 ENCOUNTER — Encounter: Payer: Self-pay | Admitting: Physical Medicine & Rehabilitation

## 2015-02-08 ENCOUNTER — Ambulatory Visit (HOSPITAL_BASED_OUTPATIENT_CLINIC_OR_DEPARTMENT_OTHER): Payer: Medicare Other | Admitting: Physical Medicine & Rehabilitation

## 2015-02-08 ENCOUNTER — Encounter: Payer: Medicare Other | Attending: Physical Medicine & Rehabilitation

## 2015-02-08 VITALS — BP 149/74 | HR 73 | Resp 14

## 2015-02-08 DIAGNOSIS — M545 Low back pain: Secondary | ICD-10-CM | POA: Insufficient documentation

## 2015-02-08 DIAGNOSIS — M47816 Spondylosis without myelopathy or radiculopathy, lumbar region: Secondary | ICD-10-CM | POA: Diagnosis not present

## 2015-02-08 MED ORDER — GABAPENTIN 300 MG PO CAPS: 300.0000 mg | ORAL_CAPSULE | Freq: Three times a day (TID) | ORAL | Status: DC

## 2015-02-08 MED ORDER — TRAMADOL HCL 50 MG PO TABS: 50.0000 mg | ORAL_TABLET | Freq: Four times a day (QID) | ORAL | Status: DC | PRN
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
## 2015-02-08 NOTE — Assessment & Plan Note (Signed)
BP today is 153/81. During his previous visit in 11/2014 it was 144/80. He was previously prescribed HCTZ 12.5 mg qd. Patient was not sure whether he takes a medication for BP. -Cont HCTZ 12.5 mg qd -Emphasized the importance of compliance -DASH diet and exercise -Reassess compliance at f/u visit. If BP continues to be elevated, consider adjusting dose of medication.

## 2015-02-08 NOTE — Progress Notes (Signed)
Bilateral Lumbar L2,  L3, L4  medial branch blocks and L 5 dorsal ramus injection under fluoroscopic guidance  Indication: Lumbar pain which is not relieved by medication management or other conservative care and interfering with self-care and mobility.  Informed consent was obtained after describing risks and benefits of the procedure with the patient, this includes bleeding, infection, paralysis and medication side effects.  The patient wishes to proceed and has given written consent.  The patient was placed in prone position.  The lumbar area was marked and prepped with Betadine.  One mL of 1% lidocaine was injected into each of 6 areas into the skin and subcutaneous tissue.  Then a 22-gauge 3.5inch spinal needle was inserted targeting the junction of the left S1 superior articular process and sacral ala junction. Needle was advanced under fluoroscopic guidance.  Bone contact was made.  Omnipaque 180 was injected x 0.5 mL demonstrating no intravascular uptake.  Then a solution containing one mL of 4 mg per mL dexamethasone and 3 mL of 2% MPF lidocaine was injected x 0.5 mL.  Then the left L5 superior articular process in transverse process junction was targeted.  Bone contact was made.  Omnipaque 180 was injected x 0.5 mL demonstrating no intravascular uptake. Then a solution containing one mL of 4 mg per mL dexamethasone and 3 mL of 2% MPF lidocaine was injected x 0.5 mL.  Then the left L4 superior articular process in transverse process junction was targeted.  Bone contact was made.  Omnipaque 180 was injected x 0.5 mL demonstrating no intravascular uptake.  Then a solution containing one mL of 4 mg per mL dexamethasone and 3 mL if 2% MPF lidocaine was injected x 0.5 mL. Then the left L3 superior articular process in transverse process junction was targeted.  Bone contact was made.  Omnipaque 180 was injected x 0.5 mL demonstrating no intravascular uptake.  Then a solution containing one mL of 4 mg per mL  dexamethasone and 3 mL if 2% MPF lidocaine was injected x 0.5 mL. This same procedure was performed on the right side using the same needle, technique and injectate.  Patient tolerated procedure well.  Post procedure instructions were given. Preinjection 10/10 Postinjection 5/10

## 2015-02-08 NOTE — Progress Notes (Signed)
  PROCEDURE RECORD [facility]   Name: Donald Larson DOB:1946/05/07 MRN: [id number]  Date:02/08/2015  Physician: Alysia Penna, MD    Nurse/CMA: Mancel Parsons, CMA  Allergies: No Known Allergies  Consent Signed: Yes.    Is patient diabetic? No.  CBG today?   Pregnant: No. LMP: No LMP for male patient. (age 67-55)  Anticoagulants: no Anti-inflammatory: no Antibiotics: no  Procedure: Bilateral Medial Branch Block   Position: Prone Start Time: 1:53pm  End Time: 2:05 PM Fluoro Time: 40  RN/CMA Mancel Parsons Ken Berdine Rasmusson    Time 1:25 PM 2:10 PM    BP 149/74 161/96    Pulse 73 67    Respirations 14 14    O2 Sat 97 98    S/S 6 6    Pain Level 10/10 5/10     D/C home with Vaughan Basta (friend), patient A & O X 3, D/C instructions reviewed, and sits independently.
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
## 2015-02-08 NOTE — Patient Instructions (Signed)

## 2015-02-13 ENCOUNTER — Telehealth: Payer: Self-pay | Admitting: *Deleted

## 2015-02-13 NOTE — Telephone Encounter (Signed)
CALLED PATIENT , LVM FOR PATIENT TO RETURN CALL, REGARDING HIS MEDICAID CARD NEEDING TO BE CORRECTED. WRONG PCP ON CARD.

## 2015-02-13 NOTE — Progress Notes (Signed)
Internal Medicine Clinic Attending  I saw and evaluated the patient.  I personally confirmed the key portions of the history and exam documented by Dr. Rathore and I reviewed pertinent patient test results.  The assessment, diagnosis, and plan were formulated together and I agree with the documentation in the resident's note.  

## 2015-03-06 DIAGNOSIS — I10 Essential (primary) hypertension: Secondary | ICD-10-CM | POA: Diagnosis not present

## 2015-03-06 DIAGNOSIS — G8929 Other chronic pain: Secondary | ICD-10-CM | POA: Diagnosis not present

## 2015-03-06 DIAGNOSIS — Z5181 Encounter for therapeutic drug level monitoring: Secondary | ICD-10-CM | POA: Diagnosis not present

## 2015-03-06 DIAGNOSIS — Z79891 Long term (current) use of opiate analgesic: Secondary | ICD-10-CM | POA: Diagnosis not present

## 2015-03-06 DIAGNOSIS — R5383 Other fatigue: Secondary | ICD-10-CM | POA: Diagnosis not present

## 2015-03-06 DIAGNOSIS — M129 Arthropathy, unspecified: Secondary | ICD-10-CM | POA: Diagnosis not present

## 2015-03-06 DIAGNOSIS — Z23 Encounter for immunization: Secondary | ICD-10-CM | POA: Diagnosis not present

## 2015-03-16 ENCOUNTER — Ambulatory Visit: Payer: Medicare Other | Admitting: Physical Medicine & Rehabilitation

## 2015-03-16 ENCOUNTER — Encounter: Payer: Medicare Other | Attending: Physical Medicine & Rehabilitation

## 2015-03-16 DIAGNOSIS — M545 Low back pain: Secondary | ICD-10-CM | POA: Insufficient documentation

## 2015-03-19 ENCOUNTER — Telehealth: Payer: Self-pay | Admitting: *Deleted

## 2015-03-19 ENCOUNTER — Encounter: Payer: Self-pay | Admitting: *Deleted

## 2015-03-19 NOTE — Telephone Encounter (Signed)
CALLED PATIENT , LVM FOR Donald Larson TO RETURN CALL. NEEDING TO KNOW IF AND WHEN IS HE GOING TO CORRECT  HIS MEDICAID CARD. WE ARE UNABLE TO MAKE ANY REFERRAL TO HIM AT THIS TIME. LETTER ALSO MAILED TO PATIENT.

## 2015-03-19 NOTE — Telephone Encounter (Signed)
CALLED PATIENT IN REGARDS TO HIS NEUROSURGERY REFERRAL, PATIENT STATES HE NO LONGER WILL BE A PATIENT HERE IN THE CLINICS ANYMORE. HAD PATIENT TO REPEAT  THE STATEMENT, TO MAKE SURE THAT IS WHAT HE SAID.

## 2015-03-20 NOTE — Addendum Note (Signed)
Addended by: Hulan Fray on: 03/20/2015 06:24 PM   Modules accepted: Orders

## 2015-03-26 ENCOUNTER — Ambulatory Visit: Payer: Medicare Other | Admitting: Gastroenterology

## 2015-04-03 DIAGNOSIS — G609 Hereditary and idiopathic neuropathy, unspecified: Secondary | ICD-10-CM | POA: Diagnosis not present

## 2015-04-03 DIAGNOSIS — F17200 Nicotine dependence, unspecified, uncomplicated: Secondary | ICD-10-CM | POA: Diagnosis not present

## 2015-04-03 DIAGNOSIS — M545 Low back pain: Secondary | ICD-10-CM | POA: Diagnosis not present

## 2015-04-03 DIAGNOSIS — I10 Essential (primary) hypertension: Secondary | ICD-10-CM | POA: Diagnosis not present

## 2015-04-03 DIAGNOSIS — G894 Chronic pain syndrome: Secondary | ICD-10-CM | POA: Diagnosis not present

## 2015-05-01 DIAGNOSIS — G609 Hereditary and idiopathic neuropathy, unspecified: Secondary | ICD-10-CM | POA: Diagnosis not present

## 2015-05-01 DIAGNOSIS — I10 Essential (primary) hypertension: Secondary | ICD-10-CM | POA: Diagnosis not present

## 2015-05-01 DIAGNOSIS — G894 Chronic pain syndrome: Secondary | ICD-10-CM | POA: Diagnosis not present

## 2015-05-01 DIAGNOSIS — M545 Low back pain: Secondary | ICD-10-CM | POA: Diagnosis not present

## 2015-05-21 DIAGNOSIS — G894 Chronic pain syndrome: Secondary | ICD-10-CM | POA: Diagnosis not present

## 2015-05-21 DIAGNOSIS — R5382 Chronic fatigue, unspecified: Secondary | ICD-10-CM | POA: Diagnosis not present

## 2015-05-21 DIAGNOSIS — I10 Essential (primary) hypertension: Secondary | ICD-10-CM | POA: Diagnosis not present

## 2015-05-21 DIAGNOSIS — Z79891 Long term (current) use of opiate analgesic: Secondary | ICD-10-CM | POA: Diagnosis not present

## 2015-05-21 DIAGNOSIS — Z5181 Encounter for therapeutic drug level monitoring: Secondary | ICD-10-CM | POA: Diagnosis not present

## 2015-06-27 ENCOUNTER — Ambulatory Visit: Payer: Medicare Other | Admitting: Internal Medicine

## 2015-07-02 ENCOUNTER — Ambulatory Visit: Payer: Medicare Other | Admitting: Internal Medicine

## 2015-07-02 DIAGNOSIS — I10 Essential (primary) hypertension: Secondary | ICD-10-CM | POA: Diagnosis not present

## 2015-07-02 DIAGNOSIS — M1990 Unspecified osteoarthritis, unspecified site: Secondary | ICD-10-CM | POA: Diagnosis not present

## 2015-07-02 DIAGNOSIS — M545 Low back pain: Secondary | ICD-10-CM | POA: Diagnosis not present

## 2015-07-02 DIAGNOSIS — G894 Chronic pain syndrome: Secondary | ICD-10-CM | POA: Diagnosis not present

## 2015-08-01 DIAGNOSIS — R5383 Other fatigue: Secondary | ICD-10-CM | POA: Diagnosis not present

## 2015-08-01 DIAGNOSIS — G894 Chronic pain syndrome: Secondary | ICD-10-CM | POA: Diagnosis not present

## 2015-08-01 DIAGNOSIS — I10 Essential (primary) hypertension: Secondary | ICD-10-CM | POA: Diagnosis not present

## 2015-08-01 DIAGNOSIS — M545 Low back pain: Secondary | ICD-10-CM | POA: Diagnosis not present

## 2015-08-28 DIAGNOSIS — R5383 Other fatigue: Secondary | ICD-10-CM | POA: Diagnosis not present

## 2015-08-28 DIAGNOSIS — M1990 Unspecified osteoarthritis, unspecified site: Secondary | ICD-10-CM | POA: Diagnosis not present

## 2015-08-28 DIAGNOSIS — G894 Chronic pain syndrome: Secondary | ICD-10-CM | POA: Diagnosis not present

## 2015-08-28 DIAGNOSIS — G629 Polyneuropathy, unspecified: Secondary | ICD-10-CM | POA: Diagnosis not present

## 2015-08-28 DIAGNOSIS — I10 Essential (primary) hypertension: Secondary | ICD-10-CM | POA: Diagnosis not present

## 2015-08-28 DIAGNOSIS — E785 Hyperlipidemia, unspecified: Secondary | ICD-10-CM | POA: Diagnosis not present

## 2015-10-02 DIAGNOSIS — G894 Chronic pain syndrome: Secondary | ICD-10-CM | POA: Diagnosis not present

## 2015-12-26 DIAGNOSIS — G894 Chronic pain syndrome: Secondary | ICD-10-CM | POA: Diagnosis not present

## 2015-12-26 DIAGNOSIS — T85511A Breakdown (mechanical) of esophageal anti-reflux device, initial encounter: Secondary | ICD-10-CM | POA: Diagnosis not present

## 2015-12-26 DIAGNOSIS — I119 Hypertensive heart disease without heart failure: Secondary | ICD-10-CM | POA: Diagnosis not present

## 2016-02-06 DIAGNOSIS — G894 Chronic pain syndrome: Secondary | ICD-10-CM | POA: Diagnosis not present

## 2016-02-06 DIAGNOSIS — I10 Essential (primary) hypertension: Secondary | ICD-10-CM | POA: Diagnosis not present

## 2016-02-07 DIAGNOSIS — G894 Chronic pain syndrome: Secondary | ICD-10-CM | POA: Diagnosis not present

## 2016-02-20 DIAGNOSIS — I973 Postprocedural hypertension: Secondary | ICD-10-CM | POA: Diagnosis not present

## 2016-03-24 DIAGNOSIS — Z23 Encounter for immunization: Secondary | ICD-10-CM | POA: Diagnosis not present

## 2016-03-24 DIAGNOSIS — H3540 Unspecified peripheral retinal degeneration: Secondary | ICD-10-CM | POA: Diagnosis not present

## 2016-03-24 DIAGNOSIS — H2511 Age-related nuclear cataract, right eye: Secondary | ICD-10-CM | POA: Diagnosis not present

## 2016-03-24 DIAGNOSIS — H353111 Nonexudative age-related macular degeneration, right eye, early dry stage: Secondary | ICD-10-CM | POA: Diagnosis not present

## 2016-03-24 DIAGNOSIS — G894 Chronic pain syndrome: Secondary | ICD-10-CM | POA: Diagnosis not present

## 2016-03-24 DIAGNOSIS — I10 Essential (primary) hypertension: Secondary | ICD-10-CM | POA: Diagnosis not present

## 2016-04-23 DIAGNOSIS — I10 Essential (primary) hypertension: Secondary | ICD-10-CM | POA: Diagnosis not present

## 2016-04-23 DIAGNOSIS — K210 Gastro-esophageal reflux disease with esophagitis: Secondary | ICD-10-CM | POA: Diagnosis not present

## 2016-04-23 DIAGNOSIS — G894 Chronic pain syndrome: Secondary | ICD-10-CM | POA: Diagnosis not present

## 2016-04-23 DIAGNOSIS — M1990 Unspecified osteoarthritis, unspecified site: Secondary | ICD-10-CM | POA: Diagnosis not present

## 2016-05-22 DIAGNOSIS — G894 Chronic pain syndrome: Secondary | ICD-10-CM | POA: Diagnosis not present

## 2016-05-22 DIAGNOSIS — R530 Neoplastic (malignant) related fatigue: Secondary | ICD-10-CM | POA: Diagnosis not present

## 2016-05-22 DIAGNOSIS — I973 Postprocedural hypertension: Secondary | ICD-10-CM | POA: Diagnosis not present

## 2016-06-24 DIAGNOSIS — I973 Postprocedural hypertension: Secondary | ICD-10-CM | POA: Diagnosis not present

## 2016-06-24 DIAGNOSIS — G894 Chronic pain syndrome: Secondary | ICD-10-CM | POA: Diagnosis not present

## 2016-06-24 DIAGNOSIS — M1991 Primary osteoarthritis, unspecified site: Secondary | ICD-10-CM | POA: Diagnosis not present

## 2016-06-24 DIAGNOSIS — J209 Acute bronchitis, unspecified: Secondary | ICD-10-CM | POA: Diagnosis not present

## 2016-07-28 DIAGNOSIS — M5136 Other intervertebral disc degeneration, lumbar region: Secondary | ICD-10-CM | POA: Diagnosis not present

## 2016-07-28 DIAGNOSIS — G894 Chronic pain syndrome: Secondary | ICD-10-CM | POA: Diagnosis not present

## 2016-07-28 DIAGNOSIS — M1991 Primary osteoarthritis, unspecified site: Secondary | ICD-10-CM | POA: Diagnosis not present

## 2016-07-28 DIAGNOSIS — I973 Postprocedural hypertension: Secondary | ICD-10-CM | POA: Diagnosis not present

## 2016-08-21 DIAGNOSIS — M1991 Primary osteoarthritis, unspecified site: Secondary | ICD-10-CM | POA: Diagnosis not present

## 2016-08-21 DIAGNOSIS — I10 Essential (primary) hypertension: Secondary | ICD-10-CM | POA: Diagnosis not present

## 2016-08-21 DIAGNOSIS — K219 Gastro-esophageal reflux disease without esophagitis: Secondary | ICD-10-CM | POA: Diagnosis not present

## 2016-08-21 DIAGNOSIS — G894 Chronic pain syndrome: Secondary | ICD-10-CM | POA: Diagnosis not present

## 2016-09-18 DIAGNOSIS — G894 Chronic pain syndrome: Secondary | ICD-10-CM | POA: Diagnosis not present

## 2016-09-18 DIAGNOSIS — K219 Gastro-esophageal reflux disease without esophagitis: Secondary | ICD-10-CM | POA: Diagnosis not present

## 2016-09-18 DIAGNOSIS — I10 Essential (primary) hypertension: Secondary | ICD-10-CM | POA: Diagnosis not present

## 2016-09-18 DIAGNOSIS — M1991 Primary osteoarthritis, unspecified site: Secondary | ICD-10-CM | POA: Diagnosis not present

## 2016-10-16 DIAGNOSIS — J449 Chronic obstructive pulmonary disease, unspecified: Secondary | ICD-10-CM | POA: Diagnosis not present

## 2016-10-16 DIAGNOSIS — G894 Chronic pain syndrome: Secondary | ICD-10-CM | POA: Diagnosis not present

## 2016-10-16 DIAGNOSIS — F17200 Nicotine dependence, unspecified, uncomplicated: Secondary | ICD-10-CM | POA: Diagnosis not present

## 2016-10-16 DIAGNOSIS — M5136 Other intervertebral disc degeneration, lumbar region: Secondary | ICD-10-CM | POA: Diagnosis not present

## 2016-10-16 DIAGNOSIS — I10 Essential (primary) hypertension: Secondary | ICD-10-CM | POA: Diagnosis not present

## 2017-03-24 DIAGNOSIS — J449 Chronic obstructive pulmonary disease, unspecified: Secondary | ICD-10-CM | POA: Diagnosis not present

## 2017-03-24 DIAGNOSIS — F17210 Nicotine dependence, cigarettes, uncomplicated: Secondary | ICD-10-CM | POA: Diagnosis not present

## 2017-03-24 DIAGNOSIS — Z8611 Personal history of tuberculosis: Secondary | ICD-10-CM | POA: Diagnosis not present

## 2017-05-05 DIAGNOSIS — G894 Chronic pain syndrome: Secondary | ICD-10-CM | POA: Diagnosis not present

## 2017-05-06 DIAGNOSIS — Z23 Encounter for immunization: Secondary | ICD-10-CM | POA: Diagnosis not present

## 2017-10-16 ENCOUNTER — Ambulatory Visit
Admission: RE | Admit: 2017-10-16 | Discharge: 2017-10-16 | Disposition: A | Discharge status: Home / Self Care | Payer: Medicare Other | Source: Ambulatory Visit | Attending: *Deleted | Admitting: *Deleted

## 2017-10-16 ENCOUNTER — Other Ambulatory Visit: Payer: Self-pay | Admitting: *Deleted

## 2017-10-16 DIAGNOSIS — Z9289 Personal history of other medical treatment: Secondary | ICD-10-CM

## 2020-12-31 DIAGNOSIS — R221 Localized swelling, mass and lump, neck: Secondary | ICD-10-CM | POA: Diagnosis not present

## 2021-01-01 ENCOUNTER — Other Ambulatory Visit: Payer: Self-pay | Admitting: Otolaryngology

## 2021-01-01 DIAGNOSIS — R221 Localized swelling, mass and lump, neck: Secondary | ICD-10-CM

## 2021-02-21 ENCOUNTER — Ambulatory Visit
Admission: RE | Admit: 2021-02-21 | Discharge: 2021-02-21 | Disposition: A | Discharge status: Home / Self Care | Payer: Medicare Other | Source: Ambulatory Visit | Attending: Otolaryngology | Admitting: Otolaryngology

## 2021-02-21 DIAGNOSIS — J3489 Other specified disorders of nose and nasal sinuses: Secondary | ICD-10-CM | POA: Diagnosis not present

## 2021-02-21 DIAGNOSIS — R221 Localized swelling, mass and lump, neck: Secondary | ICD-10-CM

## 2021-02-21 DIAGNOSIS — J432 Centrilobular emphysema: Secondary | ICD-10-CM | POA: Diagnosis not present

## 2021-02-21 DIAGNOSIS — I700 Atherosclerosis of aorta: Secondary | ICD-10-CM | POA: Diagnosis not present

## 2021-02-21 DIAGNOSIS — M47812 Spondylosis without myelopathy or radiculopathy, cervical region: Secondary | ICD-10-CM | POA: Diagnosis not present

## 2021-02-21 MED ORDER — IOPAMIDOL (ISOVUE-300) INJECTION 61%
75.0000 mL | Freq: Once | INTRAVENOUS | Status: AC | PRN
  Administered 2021-02-21: 75 mL via INTRAVENOUS

## 2021-02-22 DIAGNOSIS — Z23 Encounter for immunization: Secondary | ICD-10-CM | POA: Diagnosis not present

## 2021-03-27 DIAGNOSIS — R221 Localized swelling, mass and lump, neck: Secondary | ICD-10-CM | POA: Diagnosis not present

## 2021-03-27 DIAGNOSIS — J387 Other diseases of larynx: Secondary | ICD-10-CM | POA: Diagnosis not present

## 2021-03-28 ENCOUNTER — Encounter (HOSPITAL_BASED_OUTPATIENT_CLINIC_OR_DEPARTMENT_OTHER): Payer: Self-pay | Admitting: Otolaryngology

## 2021-03-28 ENCOUNTER — Other Ambulatory Visit: Payer: Self-pay

## 2021-04-02 NOTE — Progress Notes (Signed)
Sent text reminding pt to come in for lab work today.

## 2021-04-03 ENCOUNTER — Ambulatory Visit (HOSPITAL_BASED_OUTPATIENT_CLINIC_OR_DEPARTMENT_OTHER)
Admission: RE | Admit: 2021-04-03 | Discharge: 2021-04-03 | Disposition: A | Discharge status: Home / Self Care | Payer: Medicare Other | Attending: Otolaryngology | Admitting: Otolaryngology

## 2021-04-03 ENCOUNTER — Encounter (HOSPITAL_BASED_OUTPATIENT_CLINIC_OR_DEPARTMENT_OTHER): Payer: Self-pay | Admitting: Otolaryngology

## 2021-04-03 ENCOUNTER — Ambulatory Visit (HOSPITAL_BASED_OUTPATIENT_CLINIC_OR_DEPARTMENT_OTHER): Payer: Medicare Other | Admitting: Anesthesiology

## 2021-04-03 ENCOUNTER — Other Ambulatory Visit: Payer: Self-pay

## 2021-04-03 ENCOUNTER — Encounter (HOSPITAL_BASED_OUTPATIENT_CLINIC_OR_DEPARTMENT_OTHER)
Admission: RE | Disposition: A | Discharge status: Home / Self Care | Payer: Self-pay | Source: Home / Self Care | Attending: Otolaryngology

## 2021-04-03 DIAGNOSIS — F17200 Nicotine dependence, unspecified, uncomplicated: Secondary | ICD-10-CM

## 2021-04-03 DIAGNOSIS — C321 Malignant neoplasm of supraglottis: Secondary | ICD-10-CM | POA: Insufficient documentation

## 2021-04-03 DIAGNOSIS — C760 Malignant neoplasm of head, face and neck: Secondary | ICD-10-CM | POA: Diagnosis not present

## 2021-04-03 DIAGNOSIS — J387 Other diseases of larynx: Secondary | ICD-10-CM | POA: Diagnosis not present

## 2021-04-03 DIAGNOSIS — F17210 Nicotine dependence, cigarettes, uncomplicated: Secondary | ICD-10-CM | POA: Diagnosis not present

## 2021-04-03 DIAGNOSIS — R221 Localized swelling, mass and lump, neck: Secondary | ICD-10-CM

## 2021-04-03 DIAGNOSIS — I10 Essential (primary) hypertension: Secondary | ICD-10-CM

## 2021-04-03 DIAGNOSIS — Z801 Family history of malignant neoplasm of trachea, bronchus and lung: Secondary | ICD-10-CM | POA: Diagnosis not present

## 2021-04-03 DIAGNOSIS — K219 Gastro-esophageal reflux disease without esophagitis: Secondary | ICD-10-CM | POA: Diagnosis not present

## 2021-04-03 DIAGNOSIS — C490 Malignant neoplasm of connective and soft tissue of head, face and neck: Secondary | ICD-10-CM | POA: Diagnosis not present

## 2021-04-03 DIAGNOSIS — M48062 Spinal stenosis, lumbar region with neurogenic claudication: Secondary | ICD-10-CM | POA: Diagnosis not present

## 2021-04-03 DIAGNOSIS — C329 Malignant neoplasm of larynx, unspecified: Secondary | ICD-10-CM | POA: Diagnosis not present

## 2021-04-03 HISTORY — PX: DIRECT LARYNGOSCOPY: SHX5326

## 2021-04-03 HISTORY — PX: FINE NEEDLE ASPIRATION: SHX6590

## 2021-04-03 SURGERY — LARYNGOSCOPY, DIRECT
Anesthesia: General | Site: Neck | Laterality: Right

## 2021-04-03 MED ORDER — ONDANSETRON HCL 4 MG/2ML IJ SOLN
INTRAMUSCULAR | Status: AC
  Filled 2021-04-03: qty 2

## 2021-04-03 MED ORDER — ROCURONIUM BROMIDE 10 MG/ML (PF) SYRINGE
PREFILLED_SYRINGE | INTRAVENOUS | Status: AC
  Filled 2021-04-03: qty 10

## 2021-04-03 MED ORDER — ALBUTEROL SULFATE HFA 108 (90 BASE) MCG/ACT IN AERS
INHALATION_SPRAY | RESPIRATORY_TRACT | Status: AC
  Filled 2021-04-03: qty 6.7

## 2021-04-03 MED ORDER — ROCURONIUM BROMIDE 100 MG/10ML IV SOLN
INTRAVENOUS | Status: DC | PRN
  Administered 2021-04-03: 40 mg via INTRAVENOUS

## 2021-04-03 MED ORDER — ONDANSETRON HCL 4 MG/2ML IJ SOLN
INTRAMUSCULAR | Status: DC | PRN
  Administered 2021-04-03: 4 mg via INTRAVENOUS

## 2021-04-03 MED ORDER — GLYCOPYRROLATE PF 0.2 MG/ML IJ SOSY
PREFILLED_SYRINGE | INTRAMUSCULAR | Status: AC
  Filled 2021-04-03: qty 1

## 2021-04-03 MED ORDER — EPINEPHRINE PF 1 MG/ML IJ SOLN
INTRAMUSCULAR | Status: AC
  Filled 2021-04-03: qty 1

## 2021-04-03 MED ORDER — PROPOFOL 10 MG/ML IV BOLUS
INTRAVENOUS | Status: DC | PRN
  Administered 2021-04-03: 130 mg via INTRAVENOUS

## 2021-04-03 MED ORDER — ONDANSETRON HCL 4 MG/2ML IJ SOLN: 4.0000 mg | Freq: Once | INTRAMUSCULAR | Status: DC | PRN

## 2021-04-03 MED ORDER — DEXAMETHASONE SODIUM PHOSPHATE 10 MG/ML IJ SOLN
INTRAMUSCULAR | Status: AC
  Filled 2021-04-03: qty 1

## 2021-04-03 MED ORDER — ACETAMINOPHEN 500 MG PO TABS
ORAL_TABLET | ORAL | Status: AC
  Filled 2021-04-03: qty 2

## 2021-04-03 MED ORDER — LIDOCAINE HCL (CARDIAC) PF 100 MG/5ML IV SOSY
PREFILLED_SYRINGE | INTRAVENOUS | Status: DC | PRN
  Administered 2021-04-03: 60 mg via INTRAVENOUS

## 2021-04-03 MED ORDER — ACETAMINOPHEN 500 MG PO TABS
1000.0000 mg | ORAL_TABLET | Freq: Once | ORAL | Status: AC
  Administered 2021-04-03: 1000 mg via ORAL

## 2021-04-03 MED ORDER — SUGAMMADEX SODIUM 200 MG/2ML IV SOLN
INTRAVENOUS | Status: DC | PRN
  Administered 2021-04-03: 200 mg via INTRAVENOUS

## 2021-04-03 MED ORDER — LIDOCAINE 2% (20 MG/ML) 5 ML SYRINGE
INTRAMUSCULAR | Status: AC
  Filled 2021-04-03: qty 5

## 2021-04-03 MED ORDER — FENTANYL CITRATE (PF) 100 MCG/2ML IJ SOLN
INTRAMUSCULAR | Status: DC | PRN
  Administered 2021-04-03 (×2): 50 ug via INTRAVENOUS

## 2021-04-03 MED ORDER — GLYCOPYRROLATE 0.2 MG/ML IJ SOLN
INTRAMUSCULAR | Status: DC | PRN
  Administered 2021-04-03: .2 mg via INTRAVENOUS

## 2021-04-03 MED ORDER — FENTANYL CITRATE (PF) 100 MCG/2ML IJ SOLN: 25.0000 ug | INTRAMUSCULAR | Status: DC | PRN

## 2021-04-03 MED ORDER — LACTATED RINGERS IV SOLN: INTRAVENOUS | Status: DC

## 2021-04-03 MED ORDER — DEXAMETHASONE SODIUM PHOSPHATE 4 MG/ML IJ SOLN
INTRAMUSCULAR | Status: DC | PRN
  Administered 2021-04-03: 10 mg via INTRAVENOUS

## 2021-04-03 MED ORDER — IPRATROPIUM-ALBUTEROL 20-100 MCG/ACT IN AERS
INHALATION_SPRAY | RESPIRATORY_TRACT | Status: DC | PRN
  Administered 2021-04-03: 3 via RESPIRATORY_TRACT

## 2021-04-03 MED ORDER — FENTANYL CITRATE (PF) 100 MCG/2ML IJ SOLN
INTRAMUSCULAR | Status: AC
  Filled 2021-04-03: qty 2

## 2021-04-03 SURGICAL SUPPLY — 35 items
BNDG ADH 1X3 SHEER STRL LF (GAUZE/BANDAGES/DRESSINGS) ×4 IMPLANT
BNDG ADH THN 3X1 STRL LF (GAUZE/BANDAGES/DRESSINGS) ×2
CANISTER SUCT 1200ML W/VALVE (MISCELLANEOUS) ×4 IMPLANT
CNTNR URN SCR LID CUP LEK RST (MISCELLANEOUS) IMPLANT
CONT SPEC 4OZ STRL OR WHT (MISCELLANEOUS) ×12
COVER BACK TABLE 60X90IN (DRAPES) ×2 IMPLANT
COVER MAYO STAND STRL (DRAPES) IMPLANT
GAUZE 4X4 16PLY ~~LOC~~+RFID DBL (SPONGE) ×4 IMPLANT
GAUZE SPONGE 4X4 12PLY STRL (GAUZE/BANDAGES/DRESSINGS) IMPLANT
GLOVE SURG ENC TEXT LTX SZ7 (GLOVE) ×4 IMPLANT
GLOVE SURG UNDER POLY LF SZ7 (GLOVE) ×2 IMPLANT
GOWN STRL REUS W/TWL XL LVL3 (GOWN DISPOSABLE) ×2 IMPLANT
GUARD TEETH (MISCELLANEOUS) ×4 IMPLANT
MARKER SKIN DUAL TIP RULER LAB (MISCELLANEOUS) IMPLANT
NDL HYPO 18GX1.5 BLUNT FILL (NEEDLE) IMPLANT
NDL HYPO 27GX1-1/4 (NEEDLE) ×2 IMPLANT
NEEDLE HYPO 18GX1.5 BLUNT FILL (NEEDLE) ×12 IMPLANT
NEEDLE HYPO 22GX1.5 SAFETY (NEEDLE) IMPLANT
NEEDLE HYPO 27GX1-1/4 (NEEDLE) ×4 IMPLANT
NS IRRIG 1000ML POUR BTL (IV SOLUTION) ×4 IMPLANT
PACK BASIN DAY SURGERY FS (CUSTOM PROCEDURE TRAY) ×4 IMPLANT
PAD ALCOHOL SWAB (MISCELLANEOUS) ×8 IMPLANT
PATTIES SURGICAL .5 X3 (DISPOSABLE) IMPLANT
SHEET MEDIUM DRAPE 40X70 STRL (DRAPES) ×4 IMPLANT
SOL ANTI FOG 6CC (MISCELLANEOUS) IMPLANT
SOLUTION ANTI FOG 6CC (MISCELLANEOUS)
SPONGE GAUZE 2X2 8PLY STER LF (GAUZE/BANDAGES/DRESSINGS)
SPONGE GAUZE 2X2 8PLY STRL LF (GAUZE/BANDAGES/DRESSINGS) IMPLANT
SURGILUBE 2OZ TUBE FLIPTOP (MISCELLANEOUS) IMPLANT
SWABSTICK POVIDONE IODINE SNGL (MISCELLANEOUS) IMPLANT
SYR 5ML LL (SYRINGE) IMPLANT
SYR CONTROL 10ML LL (SYRINGE) ×4 IMPLANT
TOWEL GREEN STERILE FF (TOWEL DISPOSABLE) ×8 IMPLANT
TUBE CONNECTING 20'X1/4 (TUBING) ×1
TUBE CONNECTING 20X1/4 (TUBING) ×3 IMPLANT

## 2021-04-03 NOTE — Transfer of Care (Signed)
Immediate Anesthesia Transfer of Care Note  Patient: Donald Larson  Procedure(s) Performed: DIRECT LARYNGOSCOPY WITH BIOPSY (Mouth) NEEDLE BIOPSY OF RIGHT NECK MASS (Right: Neck)  Patient Location: PACU  Anesthesia Type:General  Level of Consciousness: awake, alert  and oriented  Airway & Oxygen Therapy: Patient Spontanous Breathing and Patient connected to face mask oxygen  Post-op Assessment: Report given to RN and Post -op Vital signs reviewed and stable  Post vital signs: Reviewed and stable  Last Vitals:  Vitals Value Taken Time  BP 142/74 04/03/21 1026  Temp    Pulse 73 04/03/21 1028  Resp 14 04/03/21 1028  SpO2 100 % 04/03/21 1028  Vitals shown include unvalidated device data.  Last Pain:  Vitals:   04/03/21 0939  TempSrc: Oral  PainSc: 0-No pain      Patients Stated Pain Goal: 3 (94/85/46 2703)  Complications: No notable events documented.
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
## 2021-04-03 NOTE — Anesthesia Procedure Notes (Signed)
Procedure Name: Intubation Date/Time: 04/03/2021 9:58 AM Performed by: Bufford Spikes, CRNA Pre-anesthesia Checklist: Patient identified, Emergency Drugs available, Suction available and Patient being monitored Patient Re-evaluated:Patient Re-evaluated prior to induction Oxygen Delivery Method: Circle system utilized Preoxygenation: Pre-oxygenation with 100% oxygen Induction Type: IV induction Ventilation: Mask ventilation without difficulty Tube type: Oral Tube size: 7.0 mm Number of attempts: 1 Airway Equipment and Method: Stylet and Oral airway Placement Confirmation: ETT inserted through vocal cords under direct vision, positive ETCO2 and breath sounds checked- equal and bilateral Secured at: 21 cm Tube secured with: Tape Dental Injury: Teeth and Oropharynx as per pre-operative assessment

## 2021-04-03 NOTE — Op Note (Signed)
Operative Note: DIRECT LARYNGOSCOPY/ESOPHAGOSCOPY/BIOPSY  Patient: [place] record number: [id number]  Date:04/03/2021  Pre-operative Indications: 1.  Right neck mass     2.  Right laryngeal mass  Postoperative Indications: Same  Surgical Procedure:  Direct Laryngoscopy and Biopsy laryngeal mass    Needle biopsy right neck mass  Anesthesia: GET  Surgeon: Delsa Bern, M.D.  Assist: None  Complications: None  EBL: None   Brief History: The patient is a 74 y.o. male with a history of asymptomatic right neck mass.  Patient underwent CT scan which showed 2 cm soft tissue mass in the right anterior neck and a an enhancing mass involving the right ary-epiglottic fold. Given the patient's history and findings I recommended direct laryngoscopy with biopsy of the laryngeal mass and neck mass under general anesthesia, risks and benefits were discussed in detail with the patient and her family. They understand and agree with our plan for surgery which is scheduled at [facility] on an elective basis.  Surgical Procedure: The patient is brought to the operating room on 04/03/2021 and placed in supine position on the operating table. General endotracheal anesthesia was established without difficulty. When the patient was adequately anesthetized, surgical timeout was performed and correct identification of the patient and the surgical procedure. The patient was positioned and prepped and draped in sterile fashion.  A laryngoscope was used to examine the patient's oral cavity, oropharynx and larynx.  Findings include mucosal covered soft tissue mass measuring approximately 2 cm involving the mid aspect of the right ary-epiglottic fold.  Multiple cup forcep biopsies were taken.  Biopsies were sent to pathology for gross and microscopic evaluation.  The remainder of the patient's airway appeared normal without evidence of mucosal mass or lesion.  There was no significant bleeding and the  patient's airway was stable.  The patient's right anterior neck mass was palpated the overlying skin was cleaned with alcohol wipe.  Multiple passes were made with an 18-gauge needle and a 10 cc syringe under suction.  Aspiration biopsy specimen was then sent to pathology for microscopic evaluation.  An orogastric tube was passed and stomach contents were aspirated. Patient was awakened from anesthetic and transferred from the operating room to the recovery room in stable condition. There were no complications and blood loss was minimal.   Delsa Bern, M.D. [facility] 04/03/2021

## 2021-04-03 NOTE — Anesthesia Preprocedure Evaluation (Addendum)
Anesthesia Evaluation  Patient identified by MRN, date of birth, ID band Patient awake    Reviewed: Allergy & Precautions, NPO status , Patient's Chart, lab work & pertinent test results  Airway Mallampati: II  TM Distance: >3 FB Neck ROM: Full    Dental  (+) Dental Advisory Given, Edentulous Lower, Edentulous Upper   Pulmonary Current SmokerPatient did not abstain from smoking.,    Pulmonary exam normal breath sounds clear to auscultation       Cardiovascular hypertension, Pt. on medications Normal cardiovascular exam Rhythm:Regular Rate:Normal     Neuro/Psych negative neurological ROS     GI/Hepatic Neg liver ROS, GERD  ,  Endo/Other  negative endocrine ROS  Renal/GU negative Renal ROS     Musculoskeletal  (+) Arthritis ,   Abdominal   Peds  Hematology negative hematology ROS (+)   Anesthesia Other Findings Day of surgery medications reviewed with the patient.  Reproductive/Obstetrics                            Anesthesia Physical Anesthesia Plan  ASA: 3  Anesthesia Plan: General   Post-op Pain Management:    Induction: Intravenous  PONV Risk Score and Plan: 2 and Dexamethasone and Ondansetron  Airway Management Planned: Oral ETT  Additional Equipment:   Intra-op Plan:   Post-operative Plan: Extubation in OR  Informed Consent: I have reviewed the patients History and Physical, chart, labs and discussed the procedure including the risks, benefits and alternatives for the proposed anesthesia with the patient or authorized representative who has indicated his/her understanding and acceptance.     Dental advisory given  Plan Discussed with: CRNA  Anesthesia Plan Comments:        Anesthesia Quick Evaluation

## 2021-04-03 NOTE — Discharge Instructions (Signed)
  Post Anesthesia Home Care Instructions  Activity: Get plenty of rest for the remainder of the day. A responsible individual must stay with you for 24 hours following the procedure.  For the next 24 hours, DO NOT: -Drive a car -Paediatric nurse -Drink alcoholic beverages -Take any medication unless instructed by your physician -Make any legal decisions or sign important papers.  Meals: Start with liquid foods such as gelatin or soup. Progress to regular foods as tolerated. Avoid greasy, spicy, heavy foods. If nausea and/or vomiting occur, drink only clear liquids until the nausea and/or vomiting subsides. Call your physician if vomiting continues.  Special Instructions/Symptoms: Your throat may feel dry or sore from the anesthesia or the breathing tube placed in your throat during surgery. If this causes discomfort, gargle with warm salt water. The discomfort should disappear within 24 hours.  If you had a scopolamine patch placed behind your ear for the management of post- operative nausea and/or vomiting:  1. The medication in the patch is effective for 72 hours, after which it should be removed.  Wrap patch in a tissue and discard in the trash. Wash hands thoroughly with soap and water. 2. You may remove the patch earlier than 72 hours if you experience unpleasant side effects which may include dry mouth, dizziness or visual disturbances. 3. Avoid touching the patch. Wash your hands with soap and water after contact with the patch.      Next dose of Tylenol after 15:44pm  for pain as needed.

## 2021-04-03 NOTE — Anesthesia Postprocedure Evaluation (Signed)
Anesthesia Post Note  Patient: Donald Larson  Procedure(s) Performed: DIRECT LARYNGOSCOPY WITH BIOPSY (Mouth) NEEDLE BIOPSY OF RIGHT NECK MASS (Right: Neck)     Patient location during evaluation: PACU Anesthesia Type: General Level of consciousness: awake and alert Pain management: pain level controlled Vital Signs Assessment: post-procedure vital signs reviewed and stable Respiratory status: spontaneous breathing, nonlabored ventilation and respiratory function stable Cardiovascular status: blood pressure returned to baseline and stable Postop Assessment: no apparent nausea or vomiting Anesthetic complications: no   No notable events documented.  Last Vitals:  Vitals:   04/03/21 1026 04/03/21 1030  BP: (!) 142/74 (!) 144/69  Pulse: 75 74  Resp: 16 14  Temp: 36.6 C   SpO2: 100% 100%    Last Pain:  Vitals:   04/03/21 1026  TempSrc:   PainSc: 0-No pain                 Catalina Gravel

## 2021-04-04 ENCOUNTER — Encounter (HOSPITAL_BASED_OUTPATIENT_CLINIC_OR_DEPARTMENT_OTHER): Payer: Self-pay | Admitting: Otolaryngology

## 2021-04-04 LAB — SURGICAL PATHOLOGY

## 2021-04-05 LAB — CYTOLOGY - NON PAP

## 2021-04-17 DIAGNOSIS — C131 Malignant neoplasm of aryepiglottic fold, hypopharyngeal aspect: Secondary | ICD-10-CM | POA: Diagnosis not present

## 2021-04-17 DIAGNOSIS — C770 Secondary and unspecified malignant neoplasm of lymph nodes of head, face and neck: Secondary | ICD-10-CM | POA: Diagnosis not present

## 2021-04-17 DIAGNOSIS — F17210 Nicotine dependence, cigarettes, uncomplicated: Secondary | ICD-10-CM | POA: Diagnosis not present
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
## 2021-04-23 ENCOUNTER — Telehealth: Payer: Self-pay | Admitting: Oncology

## 2021-04-23 ENCOUNTER — Telehealth: Payer: Self-pay | Admitting: *Deleted

## 2021-04-23 ENCOUNTER — Other Ambulatory Visit: Payer: Self-pay

## 2021-04-23 ENCOUNTER — Ambulatory Visit: Payer: Medicare Other

## 2021-04-23 ENCOUNTER — Ambulatory Visit
Admission: RE | Admit: 2021-04-23 | Discharge: 2021-04-23 | Disposition: A | Discharge status: Home / Self Care | Payer: Medicare Other | Source: Ambulatory Visit | Attending: Radiation Oncology | Admitting: Radiation Oncology

## 2021-04-23 DIAGNOSIS — C329 Malignant neoplasm of larynx, unspecified: Secondary | ICD-10-CM

## 2021-04-23 NOTE — Telephone Encounter (Signed)
PATIENT TO HAVE  PET SCAN @ [facility]- ARRIVAL TIME- 11:30 AM- PATIENT TO HAVE WATER ONLY -6 HRS. PRIOR TO TEST, INFORMED HEAD AND NECK NAVIGATOR (JENNIFER MALMFELT), JENNIFER TO CALL PATIENT WITH THIS INFO.

## 2021-04-23 NOTE — Telephone Encounter (Signed)
Scheduled appt per 11/10 referral. Pt is aware of appt date and time. Pt also has our address and know's where to come for appt.

## 2021-04-23 NOTE — Progress Notes (Signed)
Oncology Nurse Navigator Documentation   Placed introductory call to new referral patient [facility]. I have also placed a new contact number in his chart and made bold.  Introduced myself as the H&N oncology nurse navigator that works with Dr. Isidore Moos and Dr. Alen Blew to whom he has been referred by Dr. Nicolette Bang.  He confirmed understanding of referral. Briefly explained my role as his navigator, provided my contact information.  Confirmed understanding of upcoming appts and [place] location, explained arrival and registration process. I explained the purpose of a dental evaluation prior to starting RT, indicated he would be contacted by [facility] to arrange an appt.   I encouraged him to call with questions/concerns as he moves forward with appts and procedures.   He is aware that he will be scheduled for a PET scan and will receive a call from me with the appointment date/time.  He verbalized understanding of information provided, expressed appreciation for my call.   Navigator Initial Assessment Employment Status:he is retired Currently on [employer] / STD:no Living Situation: Support System: HQR:FXJO ITG:PQDI, he tells me that he has a "few teeth nubs left" Financial Concerns:not at this time.  Transportation Needs: no Sensory Deficits: Engineer, building services Needed:  no Ambulation Needs: no DME Used in Home: no Psychosocial Needs:  no Concerns/Needs Understanding Cancer:  addressed/answered by navigator to best of ability Self-Expressed Needs: no   Harlow Asa RN, BSN, OCN Head & Neck Oncology Nurse [facility] Phone # [phone]  Fax # [phone]

## 2021-04-29 ENCOUNTER — Encounter (HOSPITAL_COMMUNITY)
Admission: RE | Admit: 2021-04-29 | Discharge: 2021-04-29 | Disposition: A | Discharge status: Home / Self Care | Payer: Medicare Other | Source: Ambulatory Visit | Attending: Radiation Oncology | Admitting: Radiation Oncology

## 2021-04-29 DIAGNOSIS — C329 Malignant neoplasm of larynx, unspecified: Secondary | ICD-10-CM | POA: Diagnosis not present

## 2021-04-29 DIAGNOSIS — C321 Malignant neoplasm of supraglottis: Secondary | ICD-10-CM | POA: Diagnosis not present

## 2021-04-29 LAB — GLUCOSE, CAPILLARY: Glucose-Capillary: 95 mg/dL (ref 70–99)

## 2021-04-29 MED ORDER — FLUDEOXYGLUCOSE F - 18 (FDG) INJECTION
7.0000 | Freq: Once | INTRAVENOUS | Status: AC | PRN
  Administered 2021-04-29: 6.7 via INTRAVENOUS

## 2021-04-29 NOTE — Progress Notes (Signed)
Head and Neck Cancer Location of Tumor / Histology:  Squamous cell carcinoma of the larynx    Patient presented with symptoms of: (per Dr. Victorio Palm H&P): history of asymptomatic right neck mass and finding of soft tissue mass involving the right area epiglottic fold, patient asymptomatic (denied sore throat, dysphagia, hoarseness, or hemoptysis)  PET Scan 04/29/2021 --IMPRESSION: Hypermetabolic mass of the right epiglottis and aryepiglottic fold, maximum SUV 15.4. Hypermetabolic superficial right level IIa lymph node, maximum SUV 10.9. Borderline enlarged lymph nodes in the lower neck, chest, and retroperitoneum which are below blood pool level activity and not hypermetabolic, likely incidental. An 8 by 6 mm left upper lobe nodule has only faint metabolic activity with maximum SUV at 1.1, but this lesion is at the borderline of specific characterization by PET-CT. Biopsy might be tricky given the small size of the lesion, I would suggest short-chest CT surveillance in the next 3 months. Aortic Atherosclerosis (ICD10-I70.0) and Emphysema (ICD10-J43.9). Coronary atherosclerosis. Systemic atherosclerosis. Multilevel lumbar spondylosis and degenerative disc disease.  CT Neck w/ Contrast 02/21/2021 --IMPRESSION: Right-sided epiglottic mass lesion measuring up to 2.2 cm. Enlarged right level 1B lymph node concerning for metastatic disease. A 9 mm left lung nodule. Given above findings, this is also concerning for metastatic disease.   Biopsies revealed:  04/03/2021 FINAL MICROSCOPIC DIAGNOSIS:  A. LARYNX, RIGHT, BIOPSY:  -  Invasive squamous cell carcinoma  -  See comment  COMMENT:  Based on the biopsy the carcinoma appears moderately differentiated   FINAL MICROSCOPIC DIAGNOSIS:  A. NECK, RIGHT, FINE NEEDLE ASPIRATION:  -  Malignant cells consistent with squamous cell carcinoma   Nutrition Status Yes No Comments  Weight changes? []   [x]   Denies, but does admit he has little to no  appetite   Swallowing concerns? [x]   []   Food occasionally gets stuck to the right side of his mouth/throat  PEG? []   [x]        Referrals Yes No Comments  Social Work? [x]   []      Dentistry? [x]   []   Scheduled to meet with Dr. Sandi Mariscal 05/06/2021  Swallowing therapy? [x]   []      Nutrition? [x]   []      Med/Onc? [x]   []    Dr. Zola Button    Safety Issues Yes No Comments  Prior radiation? []   []      Pacemaker/ICD? []   []      Possible current pregnancy? []   [x]    N/A  Is the patient on methotrexate? []   []        Tobacco/Marijuana/Snuff/ETOH use: Patient is a current, every day smoker (~2 packs/day). Drinks alcohol daily and reports frequently smoking marijuana to try and stimulate his appetite    Past/Anticipated interventions by otolaryngology, if any:  04/17/2021 --Dr. [name] (office visit) 04/03/2021 --Dr. [name] Direct Laryngoscopy and Biopsy laryngeal mass Needle biopsy right neck mass   Past/Anticipated interventions by medical oncology, if any:  Scheduled with consultation with Dr. Zola Button on 05/08/2021    Current Complaints / other details:  Nothing else of note
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
## 2021-04-30 ENCOUNTER — Ambulatory Visit
Admission: RE | Admit: 2021-04-30 | Discharge: 2021-04-30 | Disposition: A | Discharge status: Home / Self Care | Payer: Medicare Other | Source: Ambulatory Visit | Attending: Radiation Oncology | Admitting: Radiation Oncology

## 2021-04-30 ENCOUNTER — Encounter: Payer: Self-pay | Admitting: Radiation Oncology

## 2021-04-30 ENCOUNTER — Other Ambulatory Visit: Payer: Self-pay

## 2021-04-30 VITALS — BP 149/72 | HR 72 | Temp 97.5°F | Resp 22 | Ht 70.0 in | Wt 131.2 lb

## 2021-04-30 DIAGNOSIS — K219 Gastro-esophageal reflux disease without esophagitis: Secondary | ICD-10-CM | POA: Diagnosis not present

## 2021-04-30 DIAGNOSIS — M47816 Spondylosis without myelopathy or radiculopathy, lumbar region: Secondary | ICD-10-CM | POA: Insufficient documentation

## 2021-04-30 DIAGNOSIS — I2510 Atherosclerotic heart disease of native coronary artery without angina pectoris: Secondary | ICD-10-CM | POA: Diagnosis not present

## 2021-04-30 DIAGNOSIS — J432 Centrilobular emphysema: Secondary | ICD-10-CM | POA: Diagnosis not present

## 2021-04-30 DIAGNOSIS — F1290 Cannabis use, unspecified, uncomplicated: Secondary | ICD-10-CM | POA: Insufficient documentation

## 2021-04-30 DIAGNOSIS — R5381 Other malaise: Secondary | ICD-10-CM

## 2021-04-30 DIAGNOSIS — C328 Malignant neoplasm of overlapping sites of larynx: Secondary | ICD-10-CM

## 2021-04-30 DIAGNOSIS — C321 Malignant neoplasm of supraglottis: Secondary | ICD-10-CM

## 2021-04-30 DIAGNOSIS — I10 Essential (primary) hypertension: Secondary | ICD-10-CM | POA: Diagnosis not present

## 2021-04-30 DIAGNOSIS — F17210 Nicotine dependence, cigarettes, uncomplicated: Secondary | ICD-10-CM | POA: Insufficient documentation

## 2021-04-30 NOTE — Progress Notes (Signed)
Oncology Nurse Navigator Documentation   Met with patient during initial consult with Dr. Isidore Moos.  Further introduced myself as his Navigator, explained my role as a member of the Care Team. Provided New Patient Information packet: Contact information for physician, this navigator, other members of the Care Team Advance Directive information ([place] blue pamphlet with LCSW insert); provided [facility] AD booklet at his request,  Fall Prevention Patient [place] Information sheet Symptom Management [facility] information [facility] map with highlight of [place] [facility] Information sheet Provided and discussed educational handouts for PEG and PAC. Assisted with post-consult appt scheduling. I explained the location of Dr. Raynelle Dick office as reference for future appts, including arrival procedure for these appts.   I will contact transportation services at Mr. Coyt's request.  They verbalized understanding of information provided. I encouraged them to call with questions/concerns moving forward.  Harlow Asa, RN, BSN, OCN Head & Neck Oncology Nurse [place] at [place] [phone]

## 2021-04-30 NOTE — Progress Notes (Signed)
Dental Form with Estimates of Radiation Dose      Diagnosis:    ICD-10-CM   1. 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 tbd  Anticipated xerostomia:  Mild permanent   Pre-simulation needs:  We are simming 11/30, has tooth roots as discussed at tumor board.  Simulation: 11/30  Other Notes:  Please contact Eppie Gibson, MD, with patient's disposition after evaluation and/or dental treatment.

## 2021-05-01 ENCOUNTER — Other Ambulatory Visit: Payer: Self-pay

## 2021-05-01 DIAGNOSIS — C321 Malignant neoplasm of supraglottis: Secondary | ICD-10-CM

## 2021-05-01 NOTE — Progress Notes (Signed)
Oncology Nurse Navigator Documentation   I called Mr. Sasaki to notify him of a newly scheduled appointment at [facility]. He is aware that on 11/28 he needs to come at 11:00 and on 11/30 he will need to now arrive at the [facility] at 11:30. He knows to call me if he has any questions.   Harlow Asa RN, BSN, OCN Head & Neck Oncology Nurse [place] at [facility] Phone # [phone]  Fax # [phone]

## 2021-05-06 ENCOUNTER — Encounter (HOSPITAL_COMMUNITY): Payer: Self-pay | Admitting: Dentistry

## 2021-05-06 ENCOUNTER — Other Ambulatory Visit: Payer: Self-pay

## 2021-05-06 ENCOUNTER — Ambulatory Visit (INDEPENDENT_AMBULATORY_CARE_PROVIDER_SITE_OTHER): Payer: Medicare Other | Admitting: Dentistry

## 2021-05-06 VITALS — BP 155/77 | HR 84 | Temp 98.4°F

## 2021-05-06 DIAGNOSIS — K08109 Complete loss of teeth, unspecified cause, unspecified class: Secondary | ICD-10-CM | POA: Insufficient documentation

## 2021-05-06 DIAGNOSIS — R221 Localized swelling, mass and lump, neck: Secondary | ICD-10-CM

## 2021-05-06 DIAGNOSIS — K045 Chronic apical periodontitis: Secondary | ICD-10-CM | POA: Insufficient documentation

## 2021-05-06 DIAGNOSIS — C321 Malignant neoplasm of supraglottis: Secondary | ICD-10-CM

## 2021-05-06 DIAGNOSIS — Z01818 Encounter for other preprocedural examination: Secondary | ICD-10-CM | POA: Diagnosis not present

## 2021-05-06 DIAGNOSIS — K083 Retained dental root: Secondary | ICD-10-CM | POA: Insufficient documentation

## 2021-05-06 DIAGNOSIS — K029 Dental caries, unspecified: Secondary | ICD-10-CM

## 2021-05-06 DIAGNOSIS — M270 Developmental disorders of jaws: Secondary | ICD-10-CM

## 2021-05-06 DIAGNOSIS — K0530 Chronic periodontitis, unspecified: Secondary | ICD-10-CM | POA: Insufficient documentation

## 2021-05-06 NOTE — Patient Instructions (Signed)
[place] [facility] Sanjna Haskew B. Karishma Unrein, D.M.D. Phone: [phone] Fax: [phone]   It was a pleasure seeing you today!  Please refer to the information below regarding your dental visit with us.  Call if you have any questions or concerns that come up after you leave.   Thank you for letting us provide care for you.  If there is anything we can do for you, please let us know.    RADIATION THERAPY AND INFORMATION REGARDING YOUR TEETH   XEROSTOMIA (DRY MOUTH):  Your salivary glands may be in the field of radiation.  Radiation may include all or only part of your salivary glands.  This will cause your saliva to dry up, and you will have a dry mouth.  The dry mouth will be for the rest of your life unless your radiation oncologist tells you otherwise.  Your saliva has many functions: It wets your tongue for speaking. It coats your teeth and the inside of your mouth for easier movement. It helps with chewing and swallowing food. It helps clean away harmful acid and toxic products made by the germs in your mouth, therefore it helps prevent cavities. It kills some germs in your mouth and helps to prevent gum disease. It helps to carry flavor to your taste buds.  Once you have lost your saliva, you will be at higher risk for tooth decay and gum disease.    What can be done to help improve your mouth when there's not enough saliva? Your dentist may give a recommendation for CLoSYS.  It will not bring back all of your saliva but may bring back some of it.  Also, your saliva may be thick and ropy or white and foamy.  It will not feel like it use to feel. You will need to swish with water every time your mouth feels dry.  YOU CANNOT suck on any cough drops, mints, lemon drops, candy, vitamin C or any other products.  You cannot use anything other than water to make your mouth feel less dry.  If you want to drink anything else, you have to drink it all at once and brush  afterwards.  Be sure to discuss the details of your diet habits with your dentist or hygienist.   RADIATION CARIES:  This is decay (cavities) that happens very quickly once your mouth is very dry due to radiation therapy.  Normally, cavities take six months to two years to become a problem.  When you have dry mouth, cavities may take as little as eight weeks to cause you a problem.    Dental check-ups every two months are necessary as long as you have a dry mouth. Radiation caries typically, but not always, start at your gum line where it is hard to see the cavity.  It is therefore also hard to fill these cavities adequately.  This high rate of cavities happens because your mouth no longer has saliva and therefore the acid made by the germs starts the decay process.  Whenever you eat anything the germs in your mouth change the food into acid.  The acid then burns a small hole in your tooth.  This small hole is the beginning of a cavity.  If this is not treated then it will grow bigger and become a cavity.  The way to avoid this hole getting bigger is to use fluoride every evening as prescribed by your dentist following your radiation. NOTE:  You have to make sure   that your teeth are very clean before you use the fluoride.  This fluoride in turn will strengthen your teeth and prepare them for another day of fighting acid. If you develop radiation caries many times, the damage is so large that you will have to have all your teeth removed.  This could be a big problem if some of these teeth are in the field of radiation.  Further details of why this could be a big problem will follow (see Osteoradionecrosis below).   DYSGEUSIA (LOSS OF TASTE):  This happens to varying degrees once you've had radiation therapy to your jaw region.  Many times taste is not completely lost, but becomes limited.  The loss of taste is mostly due to radiation affecting your taste buds.  However, if you have no saliva in your mouth  to carry the flavor to your taste buds, it would be difficult for your taste buds to taste anything.  That is why using water or a prescription for Salagen prior to meals and during meal times may help with some of the taste.  Keep in mind that taste generally returns very slowly over the course of several months or several years after radiation therapy.  Don't give up hope.   TRISMUS (LIMITED JAW OPENING):  According to your radiation oncologist, your TMJ or jaw joints are going to be partially or fully in the field of radiation.  This means that over time the muscles that help you open and close your mouth may get stiff.  This will potentially result in your not being able to open your mouth wide enough or as wide as you can open it now.    Let me give you an example of how slowly this happens and how unaware people are of it:   A gentlemen that had radiation therapy two years ago came back to me complaining that bananas are just too large for him to be able to fit them in between his teeth.  He was not able to open wide enough to bite into a banana.  This happens slowly and over a period of time.  What we do to try and prevent this:   Your dentist will probably give you a stack of sticks called a trismus exercise device.  This stack will help remind your muscles and your jaw joints to open up to the same distance every day.  Use these sticks every morning when you wake up, or according to the instructions given by your dentist.    You must use these sticks for at least one to two years after radiation therapy.  The reason for that is because it happens so slowly and keeps going on for about two years after radiation therapy.  Your hospital dentist will help you monitor your mouth opening and make sure that it's not getting smaller after radiation.  TRISMUS EXERCISES: Using the stack of sticks given to you by your dentist, place the stack in your mouth and hold onto the other end for support. Leave  the sticks in your mouth while holding the other end.  Allow 30 seconds for muscle stretching. Rest for a few seconds. Repeat 3-5 times. This exercise is recommended in the mornings and evenings unless otherwise instructed. The exercise should be done for a period of 2 YEARS after the end of radiation. Your maximum jaw opening should be checked regularly at recall dental visits by your general dentist. You should report any changes, soreness, or difficulties encountered   when doing the exercises to your dentist.   OSTEORADIONECROSIS (ORN):  This is a condition where your jaw bone after radiation therapy becomes very dry.  It has very little blood supply to keep it alive.  If you develop a cavity that turns into an abscess or an infection, then the jaw bone does not have enough blood supply to help fight the infection.  At this point it is very likely that the infection could cause the death of your jaw bone.  When you have dead bone it has to be removed.  Therefore, you might end up having to have surgery to remove part of your jaw bone, the part of the jaw bone that has been affected.     Healing is also a problem if you are to have surgery (like a tooth extraction) in the areas where the bone has had radiation therapy.  If you have surgery, you need more blood supply to heal which is not available.  When blood supply and oxygen are not available, there is a chance for the bone to die. Occasionally, ORN happens on its own with no obvious reason, but this is quite rare.  We believe that patients who continue to smoke and/or drink alcohol have a higher chance of having this problem. Once your jaw bone has had radiation therapy, if there are any remaining teeth in that area, it is not recommended to have them pulled unless your dentist or oral surgeon is aware of your history of radiation and believes it is safe.  The risks for ORN either from infection or spontaneously occurring (with no reason) are life  long.   QUESTIONS? Call our office during office hours at [phone].  
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
## 2021-05-08 ENCOUNTER — Ambulatory Visit
Admission: RE | Admit: 2021-05-08 | Discharge: 2021-05-08 | Disposition: A | Discharge status: Home / Self Care | Payer: Medicare Other | Source: Ambulatory Visit | Attending: Radiation Oncology | Admitting: Radiation Oncology

## 2021-05-08 ENCOUNTER — Other Ambulatory Visit: Payer: Self-pay

## 2021-05-08 ENCOUNTER — Inpatient Hospital Stay: Payer: Medicare Other | Attending: Oncology | Admitting: Oncology

## 2021-05-08 VITALS — BP 136/66 | HR 87 | Temp 97.8°F | Resp 19 | Ht 67.0 in | Wt 133.8 lb

## 2021-05-08 VITALS — BP 153/77 | HR 91 | Temp 98.1°F | Resp 20 | Ht 67.0 in | Wt 132.0 lb

## 2021-05-08 DIAGNOSIS — C329 Malignant neoplasm of larynx, unspecified: Secondary | ICD-10-CM

## 2021-05-08 DIAGNOSIS — Z79899 Other long term (current) drug therapy: Secondary | ICD-10-CM | POA: Diagnosis not present

## 2021-05-08 DIAGNOSIS — Z800 Family history of malignant neoplasm of digestive organs: Secondary | ICD-10-CM

## 2021-05-08 DIAGNOSIS — C321 Malignant neoplasm of supraglottis: Secondary | ICD-10-CM | POA: Diagnosis not present

## 2021-05-08 DIAGNOSIS — R5381 Other malaise: Secondary | ICD-10-CM

## 2021-05-08 DIAGNOSIS — C131 Malignant neoplasm of aryepiglottic fold, hypopharyngeal aspect: Secondary | ICD-10-CM

## 2021-05-08 DIAGNOSIS — C328 Malignant neoplasm of overlapping sites of larynx: Secondary | ICD-10-CM | POA: Diagnosis not present

## 2021-05-08 DIAGNOSIS — F17210 Nicotine dependence, cigarettes, uncomplicated: Secondary | ICD-10-CM

## 2021-05-08 DIAGNOSIS — Z801 Family history of malignant neoplasm of trachea, bronchus and lung: Secondary | ICD-10-CM

## 2021-05-08 LAB — CMP (CANCER CENTER ONLY)
ALT: 13 U/L (ref 0–44)
AST: 25 U/L (ref 15–41)
Albumin: 4.6 g/dL (ref 3.5–5.0)
Alkaline Phosphatase: 60 U/L (ref 38–126)
Anion gap: 10 (ref 5–15)
BUN: 14 mg/dL (ref 8–23)
CO2: 24 mmol/L (ref 22–32)
Calcium: 8.8 mg/dL — ABNORMAL LOW (ref 8.9–10.3)
Chloride: 105 mmol/L (ref 98–111)
Creatinine: 1.13 mg/dL (ref 0.61–1.24)
GFR, Estimated: >60 mL/min (ref >60)
Glucose, Bld: 98 mg/dL (ref 70–99)
Potassium: 3.6 mmol/L (ref 3.5–5.1)
Sodium: 139 mmol/L (ref 135–145)
Total Bilirubin: 0.5 mg/dL (ref 0.3–1.2)
Total Protein: 7.7 g/dL (ref 6.5–8.1)

## 2021-05-08 LAB — CBC WITH DIFFERENTIAL (CANCER CENTER ONLY)
Abs Immature Granulocytes: 0.02 10*3/uL (ref 0.00–0.07)
Basophils Absolute: 0.1 10*3/uL (ref 0.0–0.1)
Basophils Relative: 1 %
Eosinophils Absolute: 0.5 10*3/uL (ref 0.0–0.5)
Eosinophils Relative: 4 %
HCT: 45.1 % (ref 39.0–52.0)
Hemoglobin: 15.5 g/dL (ref 13.0–17.0)
Immature Granulocytes: 0 %
Lymphocytes Relative: 45 %
Lymphs Abs: 4.9 10*3/uL — ABNORMAL HIGH (ref 0.7–4.0)
MCH: 32.5 pg (ref 26.0–34.0)
MCHC: 34.4 g/dL (ref 30.0–36.0)
MCV: 94.5 fL (ref 80.0–100.0)
Monocytes Absolute: 0.9 10*3/uL (ref 0.1–1.0)
Monocytes Relative: 8 %
Neutro Abs: 4.5 10*3/uL (ref 1.7–7.7)
Neutrophils Relative %: 42 %
Platelet Count: 187 10*3/uL (ref 150–400)
RBC: 4.77 MIL/uL (ref 4.22–5.81)
RDW: 13.1 % (ref 11.5–15.5)
WBC Count: 10.8 10*3/uL — ABNORMAL HIGH (ref 4.0–10.5)
nRBC: 0 % (ref 0.0–0.2)

## 2021-05-08 LAB — TSH: TSH: 2.947 u[IU]/mL (ref 0.350–4.500)

## 2021-05-08 MED ORDER — SODIUM CHLORIDE 0.9% FLUSH
10.0000 mL | Freq: Once | INTRAVENOUS | Status: AC
  Administered 2021-05-08: 10 mL via INTRAVENOUS

## 2021-05-08 MED ORDER — PROCHLORPERAZINE MALEATE 10 MG PO TABS: 10.0000 mg | ORAL_TABLET | Freq: Four times a day (QID) | ORAL | 0 refills | Status: DC | PRN

## 2021-05-08 NOTE — Progress Notes (Signed)
Has armband been applied?  Yes.    Does patient have an allergy to IV contrast dye?: No.   Has patient ever received premedication for IV contrast dye?: No.   Does patient take metformin?: No.  Date of lab work: May 08, 2021 BUN: 14 CR: 1.13 eGFR: >60  IV site: forearm right, condition patent and no redness  Has IV site been added to flowsheet?  Yes.    BP (!) 153/77 (BP Location: Left Arm, Patient Position: Sitting, Cuff Size: Normal)   Pulse 91   Temp 98.1 F (36.7 C)   Resp 20   Ht 5' 7"  (1.702 m)   Wt 132 lb (59.9 kg)   SpO2 98%   BMI 20.67 kg/m

## 2021-05-08 NOTE — Progress Notes (Signed)
START ON PATHWAY REGIMEN - Head and Neck     A cycle is every 7 days:     Cisplatin   **Always confirm dose/schedule in your pharmacy ordering system**  Patient Characteristics: Larynx, Preoperative or Nonsurgical Candidate, Stage III - IVB Disease Classification: Larynx AJCC T Category: T1 AJCC 8 Stage Grouping: IVA Therapeutic Status: Preoperative or Nonsurgical Candidate AJCC N Category: cN2 AJCC M Category: M0 Intent of Therapy: Curative Intent, Discussed with Patient

## 2021-05-08 NOTE — Progress Notes (Signed)
Oncology Nurse Navigator Documentation   To provide support, encouragement and care continuity, met with Mr. Grabel during his CT SIM. He tolerated procedure without difficulty, denied questions/concerns.   I explained procedures for lobby registration, arrival to Radiation Waiting, and arrival to treatment area.  I then accompanied him to his medical oncology appointment with Dr. Alen Blew.   I encouraged him to call me prior to 05/20/21 [facility].   Harlow Asa RN, BSN, OCN Head & Neck Oncology Nurse [place] [facility] Phone # [phone]  Fax # [phone]
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
## 2021-05-14 ENCOUNTER — Telehealth: Payer: Self-pay | Admitting: Oncology

## 2021-05-14 ENCOUNTER — Other Ambulatory Visit: Payer: Self-pay | Admitting: Radiology

## 2021-05-14 NOTE — Telephone Encounter (Signed)
Scheduled per scheduled message, patient has been called and notified of upcoming December appointments.

## 2021-05-15 ENCOUNTER — Other Ambulatory Visit: Payer: Self-pay

## 2021-05-15 DIAGNOSIS — C321 Malignant neoplasm of supraglottis: Secondary | ICD-10-CM

## 2021-05-16 ENCOUNTER — Other Ambulatory Visit: Payer: Self-pay

## 2021-05-16 ENCOUNTER — Other Ambulatory Visit: Payer: Self-pay | Admitting: Oncology

## 2021-05-16 ENCOUNTER — Ambulatory Visit (HOSPITAL_COMMUNITY)
Admission: RE | Admit: 2021-05-16 | Discharge: 2021-05-16 | Disposition: A | Discharge status: Home / Self Care | Payer: Medicare Other | Source: Ambulatory Visit | Attending: Oncology | Admitting: Oncology

## 2021-05-16 DIAGNOSIS — K219 Gastro-esophageal reflux disease without esophagitis: Secondary | ICD-10-CM | POA: Diagnosis not present

## 2021-05-16 DIAGNOSIS — C329 Malignant neoplasm of larynx, unspecified: Secondary | ICD-10-CM | POA: Diagnosis not present

## 2021-05-16 DIAGNOSIS — F17210 Nicotine dependence, cigarettes, uncomplicated: Secondary | ICD-10-CM | POA: Diagnosis not present

## 2021-05-16 DIAGNOSIS — C321 Malignant neoplasm of supraglottis: Secondary | ICD-10-CM | POA: Diagnosis not present

## 2021-05-16 DIAGNOSIS — Z452 Encounter for adjustment and management of vascular access device: Secondary | ICD-10-CM | POA: Diagnosis not present

## 2021-05-16 DIAGNOSIS — I10 Essential (primary) hypertension: Secondary | ICD-10-CM | POA: Insufficient documentation

## 2021-05-16 HISTORY — PX: IR IMAGING GUIDED PORT INSERTION: IMG5740

## 2021-05-16 MED ORDER — HEPARIN SOD (PORK) LOCK FLUSH 100 UNIT/ML IV SOLN
INTRAVENOUS | Status: AC
  Administered 2021-05-16: 5 [IU]
  Filled 2021-05-16: qty 5

## 2021-05-16 MED ORDER — SODIUM CHLORIDE 0.9 % IV SOLN: INTRAVENOUS | Status: DC

## 2021-05-16 MED ORDER — MIDAZOLAM HCL 2 MG/2ML IJ SOLN
INTRAMUSCULAR | Status: AC
  Filled 2021-05-16: qty 2

## 2021-05-16 MED ORDER — FENTANYL CITRATE (PF) 100 MCG/2ML IJ SOLN
INTRAMUSCULAR | Status: AC
  Filled 2021-05-16: qty 2

## 2021-05-16 MED ORDER — FENTANYL CITRATE (PF) 100 MCG/2ML IJ SOLN
INTRAMUSCULAR | Status: AC | PRN
  Administered 2021-05-16 (×4): 25 ug via INTRAVENOUS

## 2021-05-16 MED ORDER — MIDAZOLAM HCL 2 MG/2ML IJ SOLN
INTRAMUSCULAR | Status: AC | PRN
  Administered 2021-05-16 (×2): 1 mg via INTRAVENOUS

## 2021-05-16 MED ORDER — LIDOCAINE-EPINEPHRINE 1 %-1:100000 IJ SOLN
INTRAMUSCULAR | Status: AC
  Administered 2021-05-16: 12 mL via SUBCUTANEOUS
  Filled 2021-05-16: qty 1
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
## 2021-05-16 NOTE — Procedures (Signed)
Interventional Radiology Procedure Note  Procedure: Placement of a left IJ approach single lumen PowerPort.  Tip is positioned at the superior cavoatrial junction and catheter is ready for immediate use.  Complications: No immediate Recommendations:  - Ok to shower tomorrow - Do not submerge for 7 days - Routine line care   Signed,  Leonilda Cozby K. Jackson Coffield, MD   

## 2021-05-17 ENCOUNTER — Encounter: Payer: Self-pay | Admitting: General Practice

## 2021-05-17 DIAGNOSIS — C328 Malignant neoplasm of overlapping sites of larynx: Secondary | ICD-10-CM | POA: Insufficient documentation

## 2021-05-17 DIAGNOSIS — Z79899 Other long term (current) drug therapy: Secondary | ICD-10-CM | POA: Insufficient documentation

## 2021-05-17 DIAGNOSIS — C321 Malignant neoplasm of supraglottis: Secondary | ICD-10-CM | POA: Insufficient documentation

## 2021-05-17 DIAGNOSIS — Z5111 Encounter for antineoplastic chemotherapy: Secondary | ICD-10-CM | POA: Diagnosis not present

## 2021-05-17 NOTE — Progress Notes (Signed)
[place] Clinical Social Work  Initial Assessment   Donald Larson is a 75 y.o. year old male contacted by phone. Clinical Social Work was referred by radiation oncology for assessment of psychosocial needs.   SDOH (Social Determinants of Health) assessments performed: Yes SDOH Interventions    Flowsheet Row Most Recent Value  SDOH Interventions   Food Insecurity Interventions Intervention Not Indicated  [does not eat much ordinarily]  Financial Strain Interventions Financial Counselor  Housing Interventions Intervention Not Indicated  Stress Interventions Intervention Not Indicated  Social Connections Interventions Intervention Not Indicated  Transportation Interventions [facility]       Distress Screen completed: No No flowsheet data found.    Family/Social Information:  Housing Arrangement: patient lives with sister and nephew Family members/support persons in your life? Family Transportation concerns: yes,   Employment: Retired. Used to work in Architect Income source: Paediatric nurse concerns: Yes, current concerns Type of concern: Medical bills and affording medications Food access concerns: no Religious or spiritual practice: no Medication Concerns: no  Services Currently in place:  Has Medicare Parts A and B, "some extra kind of check"  He was encouraged to contact DSS to investigate whether he could qualify for Medicaid in addition to his Medicare as his income is low and he struggles w medication copays.    Coping/ Adjustment to diagnosis: Patient understands treatment plan and what happens next? 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  Cancer found after evaluation of neck mass in Sept 2022.  Lives with sister, feels his primary difficulty will be managing the multiple appointments and tests and treatments  that come along with this diagnosis.  He is not used to having to do this much, nor does he have reliable transportation help.  He reports being quite sedentary, little physical exercise.  He reports feeling overwhelmed by what is expected of him during the course of treatment.  CSW referred to [facility] for help w getting to/from [facility], this relieves some of his stress.   Concerns about diagnosis and/or treatment:  "Ive been fairly healthy all my life, now here's this biopsy and this is overrunning me."  Multiple appointments and treatments.  "When you get old, things just add up."   Patient reported stressors: Transportation and Physical issues Hopes and priorities: "getting through this mess" Patient enjoys watching TV and household chores, ADLs Current coping skills/ strengths: Supportive family/friends     SUMMARY: Current SDOH Barriers:  Limited social support and Transportation  Interventions: Discussed common feeling and emotions when being diagnosed with cancer, and the importance of support during treatment Informed patient of the support team roles and support services at [facility] Provided CSW contact information and encouraged patient to call with any questions or concerns Provided patient with information about Medicaid and process for applying at [place].   Follow Up Plan: Patient will contact CSW with any support or resource needs Patient verbalizes understanding of plan: Yes    Beverely Pace , [place], LCSW Clinical Social Worker Phone:  [phone]

## 2021-05-20 ENCOUNTER — Inpatient Hospital Stay: Payer: Medicare Other

## 2021-05-20 ENCOUNTER — Telehealth: Payer: Self-pay | Admitting: *Deleted

## 2021-05-20 ENCOUNTER — Ambulatory Visit: Payer: Medicare Other | Admitting: Radiation Oncology

## 2021-05-20 NOTE — Progress Notes (Signed)
Oncology Nurse Navigator Documentation   I learned that Mr. Trindade missed his first radiation treatment today due to transportation issues. I have encouraged him multiple times in the past to contact [place] transportation for rides to [facility] for his cancer treatments but he has been resistant. Today he is agreeable to using [facility] transportation and tells me that he has the number and he will call it this afternoon. I have also sent an email to [facility] transportation and asked them to call him as well today. Mr. Schroepfer is aware of his appointment at 3:15 tomorrow and 7:45 am on 12/14. He has my direct contact information to call me if he has any further needs.   Harlow Asa RN, BSN, OCN Head & Neck Oncology Nurse [place] at [facility] Phone # [phone]  Fax # [phone]

## 2021-05-20 NOTE — Telephone Encounter (Signed)
Received message from [facility] RN this am that pt didn't know about his education appt today.  Called pt & informed that we would move his education appt to Wed with his treatment but wanted him to know some info before he came in.  Discussed Cisplatin side effects & need to hydrate night before & morning of treatment.  Discussed instructions for this.  Informed that treatment would be weekly for chemo & would be a long day & radiation would be daily during the week & would be short.  We would also measure urine output before chemo. Discussed transportation.  Pt does not have adequate means of transportation.  Gave him ph # to call this am for [employer].  Discussed smoking cessation & decreasing alcohol consumption & increasing non-caffeine Fluids.  Pt reports having 2 mixed drinks daily in evening.

## 2021-05-21 ENCOUNTER — Other Ambulatory Visit: Payer: Self-pay

## 2021-05-21 ENCOUNTER — Ambulatory Visit
Admission: RE | Admit: 2021-05-21 | Discharge: 2021-05-21 | Disposition: A | Discharge status: Home / Self Care | Payer: Medicare Other | Source: Ambulatory Visit | Attending: Radiation Oncology | Admitting: Radiation Oncology

## 2021-05-21 DIAGNOSIS — Z79899 Other long term (current) drug therapy: Secondary | ICD-10-CM | POA: Diagnosis not present

## 2021-05-21 DIAGNOSIS — C321 Malignant neoplasm of supraglottis: Secondary | ICD-10-CM | POA: Diagnosis not present

## 2021-05-21 DIAGNOSIS — Z5111 Encounter for antineoplastic chemotherapy: Secondary | ICD-10-CM | POA: Diagnosis not present

## 2021-05-21 DIAGNOSIS — C328 Malignant neoplasm of overlapping sites of larynx: Secondary | ICD-10-CM | POA: Diagnosis not present

## 2021-05-21 MED FILL — Dexamethasone Sodium Phosphate Inj 100 MG/10ML: INTRAMUSCULAR | Qty: 1 | Status: AC

## 2021-05-21 MED FILL — Fosaprepitant Dimeglumine For IV Infusion 150 MG (Base Eq): INTRAVENOUS | Qty: 5 | Status: AC

## 2021-05-21 NOTE — Progress Notes (Signed)
Oncology Nurse Navigator Documentation   To provide support, encouragement and care continuity, met with Mr. Debroux for his initial RT.   I reviewed the 2-step treatment process, answered questions.  Mr. Hodapp completed treatment without difficulty, denied questions/concerns. I reviewed the registration/arrival procedure for subsequent treatments. He is aware that transportation for this week has been set up. He knows to be ready around 7:15 tomorrow and close by his phone to know when his [facility] transportation ride will be picking him up.  I encouraged him to call me with questions/concerns as tmts proceed.   Harlow Asa RN, BSN, OCN Head & Neck Oncology Nurse [place] at [facility] Phone # [phone]  Fax # [phone]

## 2021-05-22 ENCOUNTER — Other Ambulatory Visit: Payer: Self-pay

## 2021-05-22 ENCOUNTER — Inpatient Hospital Stay: Payer: Medicare Other

## 2021-05-22 ENCOUNTER — Telehealth: Payer: Self-pay

## 2021-05-22 ENCOUNTER — Inpatient Hospital Stay: Payer: Medicare Other | Attending: Oncology

## 2021-05-22 ENCOUNTER — Ambulatory Visit
Admission: RE | Admit: 2021-05-22 | Discharge: 2021-05-22 | Disposition: A | Discharge status: Home / Self Care | Payer: Medicare Other | Source: Ambulatory Visit | Attending: Radiation Oncology | Admitting: Radiation Oncology

## 2021-05-22 ENCOUNTER — Other Ambulatory Visit: Payer: Self-pay | Admitting: *Deleted

## 2021-05-22 VITALS — BP 149/66 | HR 64 | Temp 97.8°F | Resp 16 | Wt 133.5 lb

## 2021-05-22 DIAGNOSIS — C328 Malignant neoplasm of overlapping sites of larynx: Secondary | ICD-10-CM | POA: Diagnosis not present

## 2021-05-22 DIAGNOSIS — C321 Malignant neoplasm of supraglottis: Secondary | ICD-10-CM

## 2021-05-22 DIAGNOSIS — Z79899 Other long term (current) drug therapy: Secondary | ICD-10-CM | POA: Diagnosis not present

## 2021-05-22 DIAGNOSIS — C329 Malignant neoplasm of larynx, unspecified: Secondary | ICD-10-CM

## 2021-05-22 DIAGNOSIS — Z5111 Encounter for antineoplastic chemotherapy: Secondary | ICD-10-CM | POA: Insufficient documentation

## 2021-05-22 LAB — CBC WITH DIFFERENTIAL (CANCER CENTER ONLY)
Abs Immature Granulocytes: 0.03 10*3/uL (ref 0.00–0.07)
Basophils Absolute: 0 10*3/uL (ref 0.0–0.1)
Basophils Relative: 1 %
Eosinophils Absolute: 0.3 10*3/uL (ref 0.0–0.5)
Eosinophils Relative: 4 %
HCT: 41.4 % (ref 39.0–52.0)
Hemoglobin: 14.4 g/dL (ref 13.0–17.0)
Immature Granulocytes: 0 %
Lymphocytes Relative: 47 %
Lymphs Abs: 3.7 10*3/uL (ref 0.7–4.0)
MCH: 32.2 pg (ref 26.0–34.0)
MCHC: 34.8 g/dL (ref 30.0–36.0)
MCV: 92.6 fL (ref 80.0–100.0)
Monocytes Absolute: 0.7 10*3/uL (ref 0.1–1.0)
Monocytes Relative: 9 %
Neutro Abs: 3.1 10*3/uL (ref 1.7–7.7)
Neutrophils Relative %: 39 %
Platelet Count: 168 10*3/uL (ref 150–400)
RBC: 4.47 MIL/uL (ref 4.22–5.81)
RDW: 12.6 % (ref 11.5–15.5)
WBC Count: 7.9 10*3/uL (ref 4.0–10.5)
nRBC: 0 % (ref 0.0–0.2)

## 2021-05-22 LAB — CMP (CANCER CENTER ONLY)
ALT: 9 U/L (ref 0–44)
AST: 21 U/L (ref 15–41)
Albumin: 3.9 g/dL (ref 3.5–5.0)
Alkaline Phosphatase: 64 U/L (ref 38–126)
Anion gap: 8 (ref 5–15)
BUN: 13 mg/dL (ref 8–23)
CO2: 24 mmol/L (ref 22–32)
Calcium: 8.7 mg/dL — ABNORMAL LOW (ref 8.9–10.3)
Chloride: 107 mmol/L (ref 98–111)
Creatinine: 0.79 mg/dL (ref 0.61–1.24)
GFR, Estimated: >60 mL/min (ref >60)
Glucose, Bld: 115 mg/dL — ABNORMAL HIGH (ref 70–99)
Potassium: 3.7 mmol/L (ref 3.5–5.1)
Sodium: 139 mmol/L (ref 135–145)
Total Bilirubin: 0.7 mg/dL (ref 0.3–1.2)
Total Protein: 6.6 g/dL (ref 6.5–8.1)

## 2021-05-22 MED ORDER — POTASSIUM CHLORIDE IN NACL 20-0.9 MEQ/L-% IV SOLN
Freq: Once | INTRAVENOUS | Status: AC
  Filled 2021-05-22: qty 1000

## 2021-05-22 MED ORDER — SODIUM CHLORIDE 0.9 % IV SOLN
40.0000 mg/m2 | Freq: Once | INTRAVENOUS | Status: AC
  Administered 2021-05-22: 12:00:00 68 mg via INTRAVENOUS
  Filled 2021-05-22: qty 68

## 2021-05-22 MED ORDER — LIDOCAINE-PRILOCAINE 2.5-2.5 % EX CREA: 1.0000 "application " | TOPICAL_CREAM | CUTANEOUS | 0 refills | Status: DC | PRN

## 2021-05-22 MED ORDER — PROCHLORPERAZINE MALEATE 10 MG PO TABS: 10.0000 mg | ORAL_TABLET | Freq: Four times a day (QID) | ORAL | 0 refills | Status: DC | PRN

## 2021-05-22 MED ORDER — MAGNESIUM SULFATE 2 GM/50ML IV SOLN
2.0000 g | Freq: Once | INTRAVENOUS | Status: AC
  Administered 2021-05-22: 09:00:00 2 g via INTRAVENOUS
  Filled 2021-05-22: qty 50

## 2021-05-22 MED ORDER — SODIUM CHLORIDE 0.9 % IV SOLN
10.0000 mg | Freq: Once | INTRAVENOUS | Status: AC
  Administered 2021-05-22: 11:00:00 10 mg via INTRAVENOUS
  Filled 2021-05-22: qty 10

## 2021-05-22 MED ORDER — SODIUM CHLORIDE 0.9 % IV SOLN: Freq: Once | INTRAVENOUS | Status: AC

## 2021-05-22 MED ORDER — PALONOSETRON HCL INJECTION 0.25 MG/5ML
0.2500 mg | Freq: Once | INTRAVENOUS | Status: AC
  Administered 2021-05-22: 10:00:00 0.25 mg via INTRAVENOUS
  Filled 2021-05-22: qty 5

## 2021-05-22 MED ORDER — SODIUM CHLORIDE 0.9 % IV SOLN
150.0000 mg | Freq: Once | INTRAVENOUS | Status: AC
  Administered 2021-05-22: 11:00:00 150 mg via INTRAVENOUS
  Filled 2021-05-22: qty 150

## 2021-05-22 NOTE — Telephone Encounter (Signed)
Reviewed information regarding chemotherapy side effects and how to manage/prevent them. Pt states that he just wants treatment and deal with side effects as they happen. He states that  is not a people person and does not want to keep talking about information about the treatments.  The less he has to keep up with the better. Pt will use ice prior to [facility] access as he does not want to keep up with putting the cream on. Cancelled prescription. Re sent compazine to [facility] on [address] [facility]. Deleted other pharmacies.

## 2021-05-22 NOTE — Progress Notes (Signed)
Upon arrival to infusion room, patient was complaining about the wait time. This RN apologized and informed him that his appointment was scheduled for 0830 and he is getting back before that time. Patient consistently complained about the time it takes for infusion, RN advised patient that this is completely voluntary and his choice that he is here today. Patient stated "I don't know" when asked if Dr. Alen Blew discussed alternatives and risks/benefits of tx. Dr. Alen Blew was notified and stated he will come to infusion room if he gets the chance to review information with patient. Patient signed the consent for tx and stated "just get it started so I can leave sooner".   Left sided port was not documented under infusion summary. RN started PIV and then realized patient has central line. Patient was asked if he would prefer his chemotherapy to be given via his port, he stated he did not care-whichever gets him out sooner.   Per Dr. Alen Blew, may run post hydration fluids with cisplatin today in order to shorten infusion length.

## 2021-05-22 NOTE — Patient Instructions (Signed)
[facility] MEDICAL [facility]COLOG[facility]  Discharge Instructions: °Thank you for choosing [facility] to provide your oncology and hematology care.  ° °If you have a lab appointment with the Cancer Center, please go directly to the Cancer Center and check in at the registration area. °  °Wear comfortable clothing and clothing appropriate for easy access to any Portacath or PICC line.  ° °We strive to give you quality time with your provider. You may need to reschedule your appointment if you arrive late (15 or more minutes).  Arriving late affects you and other patients whose appointments are after yours.  Also, if you miss three or more appointments without notifying the office, you may be dismissed from the clinic at the provider’s discretion.    °  °For prescription refill requests, have your pharmacy contact our office and allow 72 hours for refills to be completed.   ° °Today you received the following chemotherapy and/or immunotherapy agents Cisplatin    °  °To help prevent nausea and vomiting after your treatment, we encourage you to take your nausea medication as directed. ° °BELOW ARE SYMPTOMS THAT SHOULD BE REPORTED IMMEDIATELY: °*FEVER GREATER THAN 100.4 F (38 °C) OR HIGHER °*CHILLS OR SWEATING °*NAUSEA AND VOMITING THAT IS NOT CONTROLLED WITH YOUR NAUSEA MEDICATION °*UNUSUAL SHORTNESS OF BREATH °*UNUSUAL BRUISING OR BLEEDING °*URINARY PROBLEMS (pain or burning when urinating, or frequent urination) °*BOWEL PROBLEMS (unusual diarrhea, constipation, pain near the anus) °TENDERNESS IN MOUTH AND THROAT WITH OR WITHOUT PRESENCE OF ULCERS (sore throat, sores in mouth, or a toothache) °UNUSUAL RASH, SWELLING OR PAIN  °UNUSUAL VAGINAL DISCHARGE OR ITCHING  ° °Items with * indicate a potential emergency and should be followed up as soon as possible or go to the Emergency Department if any problems should occur. ° °Please show the CHEMOTHERAPY ALERT CARD or IMMUNOTHERAPY ALERT CARD at check-in to  the Emergency Department and triage nurse. ° °Should you have questions after your visit or need to cancel or reschedule your appointment, please contact [facility]  Dept: [phone]  and follow the prompts.  Office hours are 8:00 a.m. to 4:30 p.m. Monday - Friday. Please note that voicemails left after 4:00 p.m. may not be returned until the following business day.  We are closed weekends and major holidays. You have access to a nurse at all times for urgent questions. Please call the main number to the clinic Dept: [phone] and follow the prompts. ° ° °For any non-urgent questions, you may also contact your provider using MyChart. We now offer e-Visits for anyone 18 and older to request care online for non-urgent symptoms. For details visit [email or web address]. °  °Also download the MyChart app! Go to the app store, search "MyChart", open the app, select , and log in with your MyChart username and password. ° °Due to Covid, a mask is required upon entering the hospital/clinic. If you do not have a mask, one will be given to you upon arrival. For doctor visits, patients may have 1 support person aged 18 or older with them. For treatment visits, patients cannot have anyone with them due to current Covid guidelines and our immunocompromised population.  ° °Cisplatin injection °What is this medication? °CISPLATIN (SIS pla tin) is a chemotherapy drug. It targets fast dividing cells, like cancer cells, and causes these cells to die. This medicine is used to treat many types of cancer like bladder, ovarian, and testicular cancers. °This medicine may be used   for other purposes; ask your health care provider or pharmacist if you have questions. °COMMON BRAND NAME(S): Platinol, Platinol -AQ °What should I tell my care team before I take this medication? °They need to know if you have any of these conditions: °eye disease, vision problems °hearing problems °kidney  disease °low blood counts, like white cells, platelets, or red blood cells °tingling of the fingers or toes, or other nerve disorder °an unusual or allergic reaction to cisplatin, carboplatin, oxaliplatin, other medicines, foods, dyes, or preservatives °pregnant or trying to get pregnant °breast-feeding °How should I use this medication? °This drug is given as an infusion into a vein. It is administered in a hospital or clinic by a specially trained health care professional. °Talk to your pediatrician regarding the use of this medicine in children. Special care may be needed. °Overdosage: If you think you have taken too much of this medicine contact a poison control center or emergency room at once. °NOTE: This medicine is only for you. Do not share this medicine with others. °What if I miss a dose? °It is important not to miss a dose. Call your doctor or health care professional if you are unable to keep an appointment. °What may interact with this medication? °This medicine may interact with the following medications: °foscarnet °certain antibiotics like amikacin, gentamicin, neomycin, polymyxin B, streptomycin, tobramycin, vancomycin °This list may not describe all possible interactions. Give your health care provider a list of all the medicines, herbs, non-prescription drugs, or dietary supplements you use. Also tell them if you smoke, drink alcohol, or use illegal drugs. Some items may interact with your medicine. °What should I watch for while using this medication? °Your condition will be monitored carefully while you are receiving this medicine. You will need important blood work done while you are taking this medicine. °This drug may make you feel generally unwell. This is not uncommon, as chemotherapy can affect healthy cells as well as cancer cells. Report any side effects. Continue your course of treatment even though you feel ill unless your doctor tells you to stop. °This medicine may increase your  risk of getting an infection. Call your healthcare professional for advice if you get a fever, chills, or sore throat, or other symptoms of a cold or flu. Do not treat yourself. Try to avoid being around people who are sick. °Avoid taking medicines that contain aspirin, acetaminophen, ibuprofen, naproxen, or ketoprofen unless instructed by your healthcare professional. These medicines may hide a fever. °This medicine may increase your risk to bruise or bleed. Call your doctor or health care professional if you notice any unusual bleeding. °Be careful brushing and flossing your teeth or using a toothpick because you may get an infection or bleed more easily. If you have any dental work done, tell your dentist you are receiving this medicine. °Do not become pregnant while taking this medicine or for 14 months after stopping it. Women should inform their healthcare professional if they wish to become pregnant or think they might be pregnant. Men should not father a child while taking this medicine and for 11 months after stopping it. There is potential for serious side effects to an unborn child. Talk to your healthcare professional for more information. °Do not breast-feed an infant while taking this medicine. °This medicine has caused ovarian failure in some women. This medicine may make it more difficult to get pregnant. Talk to your healthcare professional if you are concerned about your fertility. °This medicine has caused   decreased sperm counts in some men. This may make it more difficult to father a child. Talk to your healthcare professional if you are concerned about your fertility. °Drink fluids as directed while you are taking this medicine. This will help protect your kidneys. °Call your doctor or health care professional if you get diarrhea. Do not treat yourself. °What side effects may I notice from receiving this medication? °Side effects that you should report to your doctor or health care professional  as soon as possible: °allergic reactions like skin rash, itching or hives, swelling of the face, lips, or tongue °blurred vision °changes in vision °decreased hearing or ringing of the ears °nausea, vomiting °pain, redness, or irritation at site where injected °pain, tingling, numbness in the hands or feet °signs and symptoms of bleeding such as bloody or black, tarry stools; red or dark brown urine; spitting up blood or brown material that looks like coffee grounds; red spots on the skin; unusual bruising or bleeding from the eyes, gums, or nose °signs and symptoms of infection like fever; chills; cough; sore throat; pain or trouble passing urine °signs and symptoms of kidney injury like trouble passing urine or change in the amount of urine °signs and symptoms of low red blood cells or anemia such as unusually weak or tired; feeling faint or lightheaded; falls; breathing problems °Side effects that usually do not require medical attention (report to your doctor or health care professional if they continue or are bothersome): °loss of appetite °mouth sores °muscle cramps °This list may not describe all possible side effects. Call your doctor for medical advice about side effects. You may report side effects to FDA at 1-800-FDA-1088. °Where should I keep my medication? °This drug is given in a hospital or clinic and will not be stored at home. °NOTE: This sheet is a summary. It may not cover all possible information. If you have questions about this medicine, talk to your doctor, pharmacist, or health care provider. °© 2022 [employer]vier/Gold Standard (2021-02-12 00:00:00) ° ° °

## 2021-05-23 ENCOUNTER — Ambulatory Visit: Payer: Medicare Other | Admitting: Physical Therapy

## 2021-05-23 ENCOUNTER — Ambulatory Visit
Admission: RE | Admit: 2021-05-23 | Discharge: 2021-05-23 | Disposition: A | Discharge status: Home / Self Care | Payer: Medicare Other | Source: Ambulatory Visit | Attending: Radiation Oncology | Admitting: Radiation Oncology

## 2021-05-23 ENCOUNTER — Telehealth: Payer: Self-pay

## 2021-05-23 ENCOUNTER — Ambulatory Visit: Payer: Medicare Other

## 2021-05-23 ENCOUNTER — Other Ambulatory Visit: Payer: Self-pay

## 2021-05-23 DIAGNOSIS — C321 Malignant neoplasm of supraglottis: Secondary | ICD-10-CM | POA: Diagnosis not present

## 2021-05-23 DIAGNOSIS — Z79899 Other long term (current) drug therapy: Secondary | ICD-10-CM | POA: Diagnosis not present

## 2021-05-23 DIAGNOSIS — C328 Malignant neoplasm of overlapping sites of larynx: Secondary | ICD-10-CM | POA: Diagnosis not present

## 2021-05-23 DIAGNOSIS — Z5111 Encounter for antineoplastic chemotherapy: Secondary | ICD-10-CM | POA: Diagnosis not present

## 2021-05-23 NOTE — Progress Notes (Signed)
Oncology Nurse Navigator Documentation   I met with Mr. 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 He declined to come and see SLP today. I explained that the swallowing therapist would teach him exercises to prevent swallowing problems that are known to happen after somebody has radiation. I explained that radiation could cause scar tissue to form to his throat that could cause difficulty swallowing after he completes radiation and even several months/years after radiation treatment. He continued to decline to see SLP. I made him aware that Dr. Isidore Moos recommended this evaluation and he was going against medical advice by not participating. He acknowledged my statement but again declined to see SLP. He also declines PT at this time and was made aware of the risk of neck lymphedema due to radiation treatment as well. Mr. Krass has my direct contact information and is aware that he can call me if he changes his mind at anytime. I've also encouraged him to call me for any needs.  Harlow Asa RN, BSN, OCN Head & Neck Oncology Nurse [place] at [facility] Phone # [phone]  Fax # [phone]

## 2021-05-23 NOTE — Telephone Encounter (Signed)
-----   Message from Derrick Ravel, RN sent at 05/22/2021  2:09 PM EST ----- Regarding: First time cisplatin, Dr. Alen Blew patient First time cisplatin, Dr. Alen Blew patient. He tolerated chemotherapy well with no adverse reactions. Please give follow up phone call tomorrow, thank you!

## 2021-05-23 NOTE — Telephone Encounter (Signed)
Spoke with Donald Larson amd he stated that he is doing well. No N/V or diarrhea.  He is eating, drinking, and urinating as he normally does.  Reviewed the importance of 64 oz of fluid a day.  He stated that he will do the best he can. Gave pt appointment time for radiation treatment tomorrow at 2:15 pm per his request so he would know when to look for his ride.

## 2021-05-24 ENCOUNTER — Ambulatory Visit
Admission: RE | Admit: 2021-05-24 | Discharge: 2021-05-24 | Disposition: A | Discharge status: Home / Self Care | Payer: Medicare Other | Source: Ambulatory Visit | Attending: Radiation Oncology | Admitting: Radiation Oncology

## 2021-05-24 ENCOUNTER — Inpatient Hospital Stay: Payer: Medicare Other | Admitting: Dietician

## 2021-05-24 DIAGNOSIS — C328 Malignant neoplasm of overlapping sites of larynx: Secondary | ICD-10-CM | POA: Diagnosis not present

## 2021-05-24 DIAGNOSIS — C321 Malignant neoplasm of supraglottis: Secondary | ICD-10-CM | POA: Diagnosis not present

## 2021-05-24 DIAGNOSIS — Z5111 Encounter for antineoplastic chemotherapy: Secondary | ICD-10-CM | POA: Diagnosis not present

## 2021-05-24 DIAGNOSIS — Z79899 Other long term (current) drug therapy: Secondary | ICD-10-CM | POA: Diagnosis not present

## 2021-05-24 NOTE — Progress Notes (Signed)
Patient did not show for scheduled nutrition appointment. Will plan to see patient during infusion scheduled 12/21.

## 2021-05-27 ENCOUNTER — Ambulatory Visit
Admission: RE | Admit: 2021-05-27 | Discharge: 2021-05-27 | Disposition: A | Discharge status: Home / Self Care | Payer: Medicare Other | Source: Ambulatory Visit | Attending: Radiation Oncology | Admitting: Radiation Oncology

## 2021-05-27 ENCOUNTER — Other Ambulatory Visit: Payer: Self-pay | Admitting: Radiation Oncology

## 2021-05-27 DIAGNOSIS — C328 Malignant neoplasm of overlapping sites of larynx: Secondary | ICD-10-CM | POA: Diagnosis not present

## 2021-05-27 DIAGNOSIS — C321 Malignant neoplasm of supraglottis: Secondary | ICD-10-CM | POA: Diagnosis not present

## 2021-05-27 DIAGNOSIS — Z5111 Encounter for antineoplastic chemotherapy: Secondary | ICD-10-CM | POA: Diagnosis not present

## 2021-05-27 DIAGNOSIS — Z79899 Other long term (current) drug therapy: Secondary | ICD-10-CM | POA: Diagnosis not present

## 2021-05-27 MED ORDER — LIDOCAINE VISCOUS HCL 2 % MT SOLN
OROMUCOSAL | 3 refills | Status: DC
Start: 2021-05-27 — End: 2023-02-01

## 2021-05-28 ENCOUNTER — Ambulatory Visit
Admission: RE | Admit: 2021-05-28 | Discharge: 2021-05-28 | Disposition: A | Discharge status: Home / Self Care | Payer: Medicare Other | Source: Ambulatory Visit | Attending: Radiation Oncology | Admitting: Radiation Oncology

## 2021-05-28 ENCOUNTER — Other Ambulatory Visit: Payer: Self-pay

## 2021-05-28 DIAGNOSIS — Z5111 Encounter for antineoplastic chemotherapy: Secondary | ICD-10-CM | POA: Diagnosis not present

## 2021-05-28 DIAGNOSIS — C328 Malignant neoplasm of overlapping sites of larynx: Secondary | ICD-10-CM | POA: Diagnosis not present

## 2021-05-28 DIAGNOSIS — C321 Malignant neoplasm of supraglottis: Secondary | ICD-10-CM | POA: Diagnosis not present

## 2021-05-28 DIAGNOSIS — Z79899 Other long term (current) drug therapy: Secondary | ICD-10-CM | POA: Diagnosis not present

## 2021-05-29 ENCOUNTER — Inpatient Hospital Stay: Payer: Medicare Other

## 2021-05-29 ENCOUNTER — Inpatient Hospital Stay: Payer: Medicare Other | Admitting: Dietician

## 2021-05-29 ENCOUNTER — Ambulatory Visit: Payer: Medicare Other

## 2021-05-29 VITALS — BP 128/72 | HR 69 | Temp 98.4°F | Resp 16 | Ht 67.0 in | Wt 132.5 lb

## 2021-05-29 DIAGNOSIS — C329 Malignant neoplasm of larynx, unspecified: Secondary | ICD-10-CM

## 2021-05-29 DIAGNOSIS — Z79899 Other long term (current) drug therapy: Secondary | ICD-10-CM | POA: Diagnosis not present

## 2021-05-29 DIAGNOSIS — C321 Malignant neoplasm of supraglottis: Secondary | ICD-10-CM

## 2021-05-29 DIAGNOSIS — Z5111 Encounter for antineoplastic chemotherapy: Secondary | ICD-10-CM | POA: Diagnosis not present

## 2021-05-29 DIAGNOSIS — C328 Malignant neoplasm of overlapping sites of larynx: Secondary | ICD-10-CM | POA: Diagnosis not present

## 2021-05-29 LAB — CBC WITH DIFFERENTIAL (CANCER CENTER ONLY)
Abs Immature Granulocytes: 0.02 10*3/uL (ref 0.00–0.07)
Basophils Absolute: 0 10*3/uL (ref 0.0–0.1)
Basophils Relative: 0 %
Eosinophils Absolute: 0.2 10*3/uL (ref 0.0–0.5)
Eosinophils Relative: 3 %
HCT: 38.5 % — ABNORMAL LOW (ref 39.0–52.0)
Hemoglobin: 13.3 g/dL (ref 13.0–17.0)
Immature Granulocytes: 0 %
Lymphocytes Relative: 41 %
Lymphs Abs: 3.2 10*3/uL (ref 0.7–4.0)
MCH: 32.8 pg (ref 26.0–34.0)
MCHC: 34.5 g/dL (ref 30.0–36.0)
MCV: 94.8 fL (ref 80.0–100.0)
Monocytes Absolute: 0.7 10*3/uL (ref 0.1–1.0)
Monocytes Relative: 9 %
Neutro Abs: 3.7 10*3/uL (ref 1.7–7.7)
Neutrophils Relative %: 47 %
Platelet Count: 165 10*3/uL (ref 150–400)
RBC: 4.06 MIL/uL — ABNORMAL LOW (ref 4.22–5.81)
RDW: 12.4 % (ref 11.5–15.5)
WBC Count: 7.9 10*3/uL (ref 4.0–10.5)
nRBC: 0 % (ref 0.0–0.2)

## 2021-05-29 LAB — CMP (CANCER CENTER ONLY)
ALT: 10 U/L (ref 0–44)
AST: 19 U/L (ref 15–41)
Albumin: 4 g/dL (ref 3.5–5.0)
Alkaline Phosphatase: 57 U/L (ref 38–126)
Anion gap: 6 (ref 5–15)
BUN: 13 mg/dL (ref 8–23)
CO2: 29 mmol/L (ref 22–32)
Calcium: 8.9 mg/dL (ref 8.9–10.3)
Chloride: 104 mmol/L (ref 98–111)
Creatinine: 0.91 mg/dL (ref 0.61–1.24)
GFR, Estimated: >60 mL/min (ref >60)
Glucose, Bld: 95 mg/dL (ref 70–99)
Potassium: 4 mmol/L (ref 3.5–5.1)
Sodium: 139 mmol/L (ref 135–145)
Total Bilirubin: 0.6 mg/dL (ref 0.3–1.2)
Total Protein: 6.5 g/dL (ref 6.5–8.1)

## 2021-05-29 LAB — MAGNESIUM: Magnesium: 1.9 mg/dL (ref 1.7–2.4)

## 2021-05-29 MED ORDER — SODIUM CHLORIDE 0.9% FLUSH
10.0000 mL | INTRAVENOUS | Status: DC | PRN
  Administered 2021-05-29: 15:00:00 10 mL

## 2021-05-29 MED ORDER — SODIUM CHLORIDE 0.9 % IV SOLN
150.0000 mg | Freq: Once | INTRAVENOUS | Status: AC
  Administered 2021-05-29: 11:00:00 150 mg via INTRAVENOUS
  Filled 2021-05-29: qty 150

## 2021-05-29 MED ORDER — MAGNESIUM SULFATE 2 GM/50ML IV SOLN
2.0000 g | Freq: Once | INTRAVENOUS | Status: AC
  Administered 2021-05-29: 09:00:00 2 g via INTRAVENOUS
  Filled 2021-05-29: qty 50

## 2021-05-29 MED ORDER — HEPARIN SOD (PORK) LOCK FLUSH 100 UNIT/ML IV SOLN
500.0000 [IU] | Freq: Once | INTRAVENOUS | Status: AC | PRN
  Administered 2021-05-29: 15:00:00 500 [IU]

## 2021-05-29 MED ORDER — DEXAMETHASONE SODIUM PHOSPHATE 100 MG/10ML IJ SOLN
10.0000 mg | Freq: Once | INTRAMUSCULAR | Status: AC
  Administered 2021-05-29: 11:00:00 10 mg via INTRAVENOUS
  Filled 2021-05-29: qty 10

## 2021-05-29 MED ORDER — PALONOSETRON HCL INJECTION 0.25 MG/5ML
0.2500 mg | Freq: Once | INTRAVENOUS | Status: AC
  Administered 2021-05-29: 11:00:00 0.25 mg via INTRAVENOUS
  Filled 2021-05-29: qty 5

## 2021-05-29 MED ORDER — SODIUM CHLORIDE 0.9 % IV SOLN
40.0000 mg/m2 | Freq: Once | INTRAVENOUS | Status: AC
  Administered 2021-05-29: 12:00:00 68 mg via INTRAVENOUS
  Filled 2021-05-29: qty 68

## 2021-05-29 MED ORDER — SODIUM CHLORIDE 0.9 % IV SOLN: Freq: Once | INTRAVENOUS | Status: AC

## 2021-05-29 MED ORDER — POTASSIUM CHLORIDE IN NACL 20-0.9 MEQ/L-% IV SOLN
Freq: Once | INTRAVENOUS | Status: AC
  Filled 2021-05-29: qty 1000

## 2021-05-29 NOTE — Progress Notes (Signed)
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 He is receiving concurrent chemoradiation with weekly cisplatin. Patient followed by Dr. Alen Blew and Dr. Isidore Moos.   Past medical history includes GERD, hip dysplasia, left eye blindness, shingles, chronic periodontitis, spondylosis of lumbar region, HTN  Met with patient during infusion. He reports throat is sore to the touch, denies swallowing difficulties. Patient reports he is eating 2 meals daily as usual. Patient recalls waking up at 3 AM and drinking a few cups of coffee before breakfast (bowl of oatmeal and a couple of eggs). He does not eat a lunch meal or snack before supper. Patient reports drinking 1-2 vodka orange juices in the evening. He does not drink water, "does not trust water or like the way it taste." Patient has not tried oral nutrition supplements, he reports not liking milk. Occasionally he will use milk when making oatmeal. Patient denies nausea, vomiting, constipation, diarrhea, altered taste, dry mouth. Patient is not using baking soda salt water rinses.   Nutrition Focused Physical Exam: deferred   Medications: Decadron, Emend, Lidocaine, Prilosec, Oxycodone HCI, Aloxi, Compazine   Labs: reviewed   Anthropometrics:   Height: 5'7" Weight: 132 lb 8 oz UBW: 130-135 lb (per pt since he stopped working 6-7 years ago) BMI: 20.75   Estimated Energy Needs  Kcals: 2100-2300 Protein: 90-108 Fluid: 2.2 L   NUTRITION DIAGNOSIS: Predicted suboptimal intake related to cancer and associated treatment side effects as evidenced by radiation therapy affecting ability to chew and swallow  INTERVENTION:  Educated on importance of adequate calories and protein to maintain weights strength Encouraged soft moist high protein foods - handout and shake recipes given Recommend drinking oral nutrition supplement for added calories and protein,  provided ensure complete sample for pt to try - coupons given  Encouraged baking soda salt water rinses several times daily - provided recipe Contact information given  MONITORING, EVALUATION, GOAL: Patient will tolerate increased calories and protein to promote weight stability   Next Visit: Wednesday January 4 during infusion

## 2021-05-29 NOTE — Patient Instructions (Signed)
[facility]COL[facility]  Discharge Instructions: Thank you for choosing [facility] to provide your oncology and hematology care.   If you have a lab appointment with the Cancer Center, please go directly to the Cancer Center and check in at the registration area.   Wear comfortable clothing and clothing appropriate for easy access to any Portacath or PICC line.   We strive to give you quality time with your provider. You may need to reschedule your appointment if you arrive late (15 or more minutes).  Arriving late affects you and other patients whose appointments are after yours.  Also, if you miss three or more appointments without notifying the office, you may be dismissed from the clinic at the provider's discretion.      For prescription refill requests, have your pharmacy contact our office and allow 72 hours for refills to be completed.    Today you received the following chemotherapy and/or immunotherapy agents : Cisplatin    To help prevent nausea and vomiting after your treatment, we encourage you to take your nausea medication as directed.  BELOW ARE SYMPTOMS THAT SHOULD BE REPORTED IMMEDIATELY: *FEVER GREATER THAN 100.4 F (38 C) OR HIGHER *CHILLS OR SWEATING *NAUSEA AND VOMITING THAT IS NOT CONTROLLED WITH YOUR NAUSEA MEDICATION *UNUSUAL SHORTNESS OF BREATH *UNUSUAL BRUISING OR BLEEDING *URINARY PROBLEMS (pain or burning when urinating, or frequent urination) *BOWEL PROBLEMS (unusual diarrhea, constipation, pain near the anus) TENDERNESS IN MOUTH AND THROAT WITH OR WITHOUT PRESENCE OF ULCERS (sore throat, sores in mouth, or a toothache) UNUSUAL RASH, SWELLING OR PAIN  UNUSUAL VAGINAL DISCHARGE OR ITCHING   Items with * indicate a potential emergency and should be followed up as soon as possible or go to the Emergency Department if any problems should occur.  Please show the CHEMOTHERAPY ALERT CARD or IMMUNOTHERAPY ALERT CARD at check-in to  the Emergency Department and triage nurse.  Should you have questions after your visit or need to cancel or reschedule your appointment, please contact [facility]  Dept: [phone]  and follow the prompts.  Office hours are 8:00 a.m. to 4:30 p.m. Monday - Friday. Please note that voicemails left after 4:00 p.m. may not be returned until the following business day.  We are closed weekends and major holidays. You have access to a nurse at all times for urgent questions. Please call the main number to the clinic Dept: [phone] and follow the prompts.   For any non-urgent questions, you may also contact your provider using MyChart. We now offer e-Visits for anyone 18 and older to request care online for non-urgent symptoms. For details visit [email or web address].   Also download the MyChart app! Go to the app store, search "MyChart", open the app, select [place], and log in with your MyChart username and password.  Due to Covid, a mask is required upon entering the hospital/clinic. If you do not have a mask, one will be given to you upon arrival. For doctor visits, patients may have 1 support person aged 18 or older with them. For treatment visits, patients cannot have anyone with them due to current Covid guidelines and our immunocompromised population.   

## 2021-05-30 ENCOUNTER — Ambulatory Visit
Admission: RE | Admit: 2021-05-30 | Discharge: 2021-05-30 | Disposition: A | Discharge status: Home / Self Care | Payer: Medicare Other | Source: Ambulatory Visit | Attending: Radiation Oncology | Admitting: Radiation Oncology

## 2021-05-30 ENCOUNTER — Other Ambulatory Visit: Payer: Self-pay

## 2021-05-30 DIAGNOSIS — C321 Malignant neoplasm of supraglottis: Secondary | ICD-10-CM | POA: Diagnosis not present

## 2021-05-30 DIAGNOSIS — Z5111 Encounter for antineoplastic chemotherapy: Secondary | ICD-10-CM | POA: Diagnosis not present

## 2021-05-30 DIAGNOSIS — Z79899 Other long term (current) drug therapy: Secondary | ICD-10-CM | POA: Diagnosis not present

## 2021-05-30 DIAGNOSIS — C328 Malignant neoplasm of overlapping sites of larynx: Secondary | ICD-10-CM | POA: Diagnosis not present

## 2021-05-31 ENCOUNTER — Ambulatory Visit
Admission: RE | Admit: 2021-05-31 | Discharge: 2021-05-31 | Disposition: A | Discharge status: Home / Self Care | Payer: Medicare Other | Source: Ambulatory Visit | Attending: Radiation Oncology | Admitting: Radiation Oncology

## 2021-05-31 DIAGNOSIS — Z5111 Encounter for antineoplastic chemotherapy: Secondary | ICD-10-CM | POA: Diagnosis not present

## 2021-05-31 DIAGNOSIS — Z79899 Other long term (current) drug therapy: Secondary | ICD-10-CM | POA: Diagnosis not present

## 2021-05-31 DIAGNOSIS — C321 Malignant neoplasm of supraglottis: Secondary | ICD-10-CM | POA: Diagnosis not present

## 2021-05-31 DIAGNOSIS — C328 Malignant neoplasm of overlapping sites of larynx: Secondary | ICD-10-CM | POA: Diagnosis not present

## 2021-06-04 ENCOUNTER — Other Ambulatory Visit: Payer: Self-pay

## 2021-06-04 ENCOUNTER — Ambulatory Visit
Admission: RE | Admit: 2021-06-04 | Discharge: 2021-06-04 | Disposition: A | Discharge status: Home / Self Care | Payer: Medicare Other | Source: Ambulatory Visit | Attending: Radiation Oncology | Admitting: Radiation Oncology

## 2021-06-04 DIAGNOSIS — Z5111 Encounter for antineoplastic chemotherapy: Secondary | ICD-10-CM | POA: Diagnosis not present

## 2021-06-04 DIAGNOSIS — C321 Malignant neoplasm of supraglottis: Secondary | ICD-10-CM | POA: Diagnosis not present

## 2021-06-04 DIAGNOSIS — Z79899 Other long term (current) drug therapy: Secondary | ICD-10-CM | POA: Diagnosis not present

## 2021-06-04 DIAGNOSIS — C328 Malignant neoplasm of overlapping sites of larynx: Secondary | ICD-10-CM | POA: Diagnosis not present

## 2021-06-05 ENCOUNTER — Inpatient Hospital Stay: Payer: Medicare Other

## 2021-06-05 ENCOUNTER — Ambulatory Visit
Admission: RE | Admit: 2021-06-05 | Discharge: 2021-06-05 | Disposition: A | Discharge status: Home / Self Care | Payer: Medicare Other | Source: Ambulatory Visit | Attending: Radiation Oncology | Admitting: Radiation Oncology

## 2021-06-05 ENCOUNTER — Inpatient Hospital Stay (HOSPITAL_BASED_OUTPATIENT_CLINIC_OR_DEPARTMENT_OTHER): Payer: Medicare Other | Admitting: Oncology

## 2021-06-05 VITALS — BP 125/57 | HR 63 | Temp 97.9°F | Resp 16 | Ht 67.0 in | Wt 131.6 lb

## 2021-06-05 DIAGNOSIS — C328 Malignant neoplasm of overlapping sites of larynx: Secondary | ICD-10-CM | POA: Diagnosis not present

## 2021-06-05 DIAGNOSIS — Z5111 Encounter for antineoplastic chemotherapy: Secondary | ICD-10-CM | POA: Diagnosis not present

## 2021-06-05 DIAGNOSIS — C321 Malignant neoplasm of supraglottis: Secondary | ICD-10-CM

## 2021-06-05 DIAGNOSIS — C329 Malignant neoplasm of larynx, unspecified: Secondary | ICD-10-CM

## 2021-06-05 DIAGNOSIS — Z95828 Presence of other vascular implants and grafts: Secondary | ICD-10-CM

## 2021-06-05 DIAGNOSIS — Z79899 Other long term (current) drug therapy: Secondary | ICD-10-CM | POA: Diagnosis not present

## 2021-06-05 LAB — CMP (CANCER CENTER ONLY)
ALT: 12 U/L (ref 0–44)
AST: 22 U/L (ref 15–41)
Albumin: 4 g/dL (ref 3.5–5.0)
Alkaline Phosphatase: 56 U/L (ref 38–126)
Anion gap: 7 (ref 5–15)
BUN: 18 mg/dL (ref 8–23)
CO2: 27 mmol/L (ref 22–32)
Calcium: 8.4 mg/dL — ABNORMAL LOW (ref 8.9–10.3)
Chloride: 105 mmol/L (ref 98–111)
Creatinine: 0.99 mg/dL (ref 0.61–1.24)
GFR, Estimated: >60 mL/min (ref >60)
Glucose, Bld: 109 mg/dL — ABNORMAL HIGH (ref 70–99)
Potassium: 3.5 mmol/L (ref 3.5–5.1)
Sodium: 139 mmol/L (ref 135–145)
Total Bilirubin: 0.4 mg/dL (ref 0.3–1.2)
Total Protein: 6.6 g/dL (ref 6.5–8.1)

## 2021-06-05 LAB — CBC WITH DIFFERENTIAL (CANCER CENTER ONLY)
Abs Immature Granulocytes: 0.03 10*3/uL (ref 0.00–0.07)
Basophils Absolute: 0 10*3/uL (ref 0.0–0.1)
Basophils Relative: 0 %
Eosinophils Absolute: 0.2 10*3/uL (ref 0.0–0.5)
Eosinophils Relative: 3 %
HCT: 36.4 % — ABNORMAL LOW (ref 39.0–52.0)
Hemoglobin: 12.7 g/dL — ABNORMAL LOW (ref 13.0–17.0)
Immature Granulocytes: 0 %
Lymphocytes Relative: 35 %
Lymphs Abs: 2.4 10*3/uL (ref 0.7–4.0)
MCH: 32.7 pg (ref 26.0–34.0)
MCHC: 34.9 g/dL (ref 30.0–36.0)
MCV: 93.8 fL (ref 80.0–100.0)
Monocytes Absolute: 0.8 10*3/uL (ref 0.1–1.0)
Monocytes Relative: 11 %
Neutro Abs: 3.4 10*3/uL (ref 1.7–7.7)
Neutrophils Relative %: 51 %
Platelet Count: 149 10*3/uL — ABNORMAL LOW (ref 150–400)
RBC: 3.88 MIL/uL — ABNORMAL LOW (ref 4.22–5.81)
RDW: 12.8 % (ref 11.5–15.5)
WBC Count: 6.7 10*3/uL (ref 4.0–10.5)
nRBC: 0 % (ref 0.0–0.2)

## 2021-06-05 MED ORDER — SODIUM CHLORIDE 0.9% FLUSH
10.0000 mL | INTRAVENOUS | Status: DC | PRN
  Administered 2021-06-05: 15:00:00 10 mL

## 2021-06-05 MED ORDER — SODIUM CHLORIDE 0.9% FLUSH
10.0000 mL | Freq: Once | INTRAVENOUS | Status: AC
  Administered 2021-06-05: 08:00:00 10 mL

## 2021-06-05 MED ORDER — SODIUM CHLORIDE 0.9 % IV SOLN
10.0000 mg | Freq: Once | INTRAVENOUS | Status: AC
  Administered 2021-06-05: 11:00:00 10 mg via INTRAVENOUS
  Filled 2021-06-05: qty 10

## 2021-06-05 MED ORDER — POTASSIUM CHLORIDE IN NACL 20-0.9 MEQ/L-% IV SOLN
Freq: Once | INTRAVENOUS | Status: AC
  Filled 2021-06-05: qty 1000

## 2021-06-05 MED ORDER — SODIUM CHLORIDE 0.9 % IV SOLN
150.0000 mg | Freq: Once | INTRAVENOUS | Status: AC
  Administered 2021-06-05: 11:00:00 150 mg via INTRAVENOUS
  Filled 2021-06-05: qty 150

## 2021-06-05 MED ORDER — MAGNESIUM SULFATE 2 GM/50ML IV SOLN
2.0000 g | Freq: Once | INTRAVENOUS | Status: AC
  Administered 2021-06-05: 09:00:00 2 g via INTRAVENOUS
  Filled 2021-06-05: qty 50

## 2021-06-05 MED ORDER — SODIUM CHLORIDE 0.9 % IV SOLN
40.0000 mg/m2 | Freq: Once | INTRAVENOUS | Status: AC
  Administered 2021-06-05: 12:00:00 68 mg via INTRAVENOUS
  Filled 2021-06-05: qty 68

## 2021-06-05 MED ORDER — PALONOSETRON HCL INJECTION 0.25 MG/5ML
0.2500 mg | Freq: Once | INTRAVENOUS | Status: AC
  Administered 2021-06-05: 11:00:00 0.25 mg via INTRAVENOUS
  Filled 2021-06-05: qty 5

## 2021-06-05 MED ORDER — SODIUM CHLORIDE 0.9 % IV SOLN: Freq: Once | INTRAVENOUS | Status: AC

## 2021-06-05 MED ORDER — HEPARIN SOD (PORK) LOCK FLUSH 100 UNIT/ML IV SOLN
500.0000 [IU] | Freq: Once | INTRAVENOUS | Status: AC | PRN
  Administered 2021-06-05: 15:00:00 500 [IU]

## 2021-06-05 NOTE — Progress Notes (Signed)
Per Dr. Alen Blew, No mag level needed today.  1050  Urine output 175.  Per Dr. Alen Blew ok to proceed with treatment.

## 2021-06-05 NOTE — Progress Notes (Signed)
Hematology and Oncology Follow Up Visit  Donald Larson [id number] Jan 24, 1946 75 y.o. 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   Prior Therapy: He is status post directed endoscopy at that time revealed a soft tissue mass in the right aryepiglottic fold and neck mass measuring 2 cm on examination.  Biopsy at that time revealed a moderately differentiated invasive squamous cell carcinoma with fine-needle aspiration of the right supraglottic neck mass revealed malignant squamous cell carcinoma.     Current therapy: Definitive therapy with radiation and weekly cisplatin started on May 22, 2021.  He is here for cycle 3 of therapy.  Interim History: Donald Larson returns today for a follow-up visit.  Since the last visit, he continues to tolerate therapy without any major complications.  He denies any nausea, vomiting or abdominal pain.  He does report some dysphagia but is still able to eat and maintain his weight for the time being.  Denies any worsening neuropathy or infusion related complications.     Medications: I have reviewed the patient's current medications.  Current Outpatient Medications  Medication Sig Dispense Refill   amLODipine (NORVASC) 10 MG tablet Take 10 mg by mouth daily.     Aspirin-Caffeine (BC FAST PAIN RELIEF PO) Take 2 packets by mouth daily as needed (pain).     hydrochlorothiazide (HYDRODIURIL) 12.5 MG tablet Take 12.5 mg by mouth daily as needed (swelling).     lidocaine (XYLOCAINE) 2 % solution Patient: Mix 1part 2% viscous lidocaine, 1part H20. Swish & swallow 35mL of diluted mixture, 24min before meals and at bedtime, up to QID for soreness. 200 mL 3   omeprazole (PRILOSEC OTC) 20 MG tablet Take 20 mg by mouth See admin instructions. Take 20 mg daily, may take a second 20 mg dose as needed for acid reflux      Oxycodone HCl 10 MG TABS Take 10 mg by mouth 2 (two) times daily.     prochlorperazine (COMPAZINE) 10 MG tablet Take 1 tablet (10 mg total) by mouth every 6 (six) hours as needed for nausea or vomiting. 30 tablet 0   No current facility-administered medications for this visit.     Allergies: No Known Allergies   Physical Exam: Blood pressure (!) 125/57, pulse 63, temperature 97.9 F (36.6 C), temperature source Temporal, resp. rate 16, height 5\' 7"  (1.702 m), weight 131 lb 9.6 oz (59.7 kg), SpO2 100 %. ECOG: 1    General appearance: Comfortable appearing without any discomfort Head: Normocephalic without any trauma Oropharynx: Mucous membranes are moist and pink without any thrush or ulcers. Eyes: Pupils are equal and round reactive to light. Lymph nodes: No cervical, supraclavicular, inguinal or axillary lymphadenopathy.   Heart:regular rate and rhythm.  S1 and S2 without leg edema. Lung: Clear without any rhonchi or wheezes.  No dullness to percussion. Abdomin: Soft, nontender, nondistended with good bowel sounds.  No hepatosplenomegaly. Musculoskeletal: No joint deformity or effusion.  Full range of motion noted. Neurological: No deficits noted on motor, sensory and deep tendon reflex exam. Skin: No petechial rash or dryness.  Appeared moist.      Lab Results: Lab Results  Component Value Date   WBC 6.7 06/05/2021   HGB 12.7 (L) 06/05/2021   HCT 36.4 (L) 06/05/2021   MCV 93.8 06/05/2021   PLT 149 (L) 06/05/2021     Chemistry  Component Value Date/Time   NA 139 05/29/2021 0734   K 4.0 05/29/2021 0734   CL 104 05/29/2021 0734   CO2 29 05/29/2021 0734   BUN 13 05/29/2021 0734   CREATININE 0.91 05/29/2021 0734   CREATININE 0.81 11/23/2014 1504      Component Value Date/Time   CALCIUM 8.9 05/29/2021 0734   ALKPHOS 57 05/29/2021 0734   AST 19 05/29/2021 0734   ALT 10 05/29/2021 0734   BILITOT 0.6 05/29/2021 0734        Impression and  Plan:   75 year old with:  1.  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   He continues to tolerate therapy without any major complications.  Risks and benefits of continuing this approach were discussed.  Complications that include nausea, vomiting, renal dysfunction among others were reiterated.  He is agreeable to continue.   2.  Kidney function surveillance: His creatinine continues to be at baseline without any issues related to platinum therapy.  We will continue to monitor.   3.  IV access: Port-A-Cath remains in use without any issues.   4.  Nutritional status: We will continue to monitor his status closely.  We will avoid feeding tube for the time being.   5.  Antiemetics: No nausea or vomiting reported at this time.  Compazine is available to him.   6.  Follow-up: He will continue to return weekly for treatment and MD follow-up in the next 2 to 3 weeks.   30  minutes were spent on this encounter.  The time was dedicated to reviewing laboratory data, disease status update, treatment choices and future plan of care review.          Donald Button, MD 12/28/20229:01 AM

## 2021-06-05 NOTE — Patient Instructions (Signed)
[facility]  Discharge Instructions: Thank you for choosing [facility] to provide your oncology and hematology care.   If you have a lab appointment with the Cancer Center, please go directly to the Cancer Center and check in at the registration area.   Wear comfortable clothing and clothing appropriate for easy access to any Portacath or PICC line.   We strive to give you quality time with your provider. You may need to reschedule your appointment if you arrive late (15 or more minutes).  Arriving late affects you and other patients whose appointments are after yours.  Also, if you miss three or more appointments without notifying the office, you may be dismissed from the clinic at the provider's discretion.      For prescription refill requests, have your pharmacy contact our office and allow 72 hours for refills to be completed.    Today you received the following chemotherapy and/or immunotherapy agents : Cisplatin    To help prevent nausea and vomiting after your treatment, we encourage you to take your nausea medication as directed.  BELOW ARE SYMPTOMS THAT SHOULD BE REPORTED IMMEDIATELY: *FEVER GREATER THAN 100.4 F (38 C) OR HIGHER *CHILLS OR SWEATING *NAUSEA AND VOMITING THAT IS NOT CONTROLLED WITH YOUR NAUSEA MEDICATION *UNUSUAL SHORTNESS OF BREATH *UNUSUAL BRUISING OR BLEEDING *URINARY PROBLEMS (pain or burning when urinating, or frequent urination) *BOWEL PROBLEMS (unusual diarrhea, constipation, pain near the anus) TENDERNESS IN MOUTH AND THROAT WITH OR WITHOUT PRESENCE OF ULCERS (sore throat, sores in mouth, or a toothache) UNUSUAL RASH, SWELLING OR PAIN  UNUSUAL VAGINAL DISCHARGE OR ITCHING   Items with * indicate a potential emergency and should be followed up as soon as possible or go to the Emergency Department if any problems should occur.  Please show the CHEMOTHERAPY ALERT CARD or IMMUNOTHERAPY ALERT CARD at check-in to  the Emergency Department and triage nurse.  Should you have questions after your visit or need to cancel or reschedule your appointment, please contact [facility]  Dept: [phone]  and follow the prompts.  Office hours are 8:00 a.m. to 4:30 p.m. Monday - Friday. Please note that voicemails left after 4:00 p.m. may not be returned until the following business day.  We are closed weekends and major holidays. You have access to a nurse at all times for urgent questions. Please call the main number to the clinic Dept: [phone] and follow the prompts.   For any non-urgent questions, you may also contact your provider using MyChart. We now offer e-Visits for anyone 18 and older to request care online for non-urgent symptoms. For details visit [email or web address].   Also download the MyChart app! Go to the app store, search "MyChart", open the app, select [facility], and log in with your MyChart username and password.  Due to Covid, a mask is required upon entering the hospital/clinic. If you do not have a mask, one will be given to you upon arrival. For doctor visits, patients may have 1 support person aged 18 or older with them. For treatment visits, patients cannot have anyone with them due to current Covid guidelines and our immunocompromised population.   

## 2021-06-06 ENCOUNTER — Ambulatory Visit
Admission: RE | Admit: 2021-06-06 | Discharge: 2021-06-06 | Disposition: A | Discharge status: Home / Self Care | Payer: Medicare Other | Source: Ambulatory Visit | Attending: Radiation Oncology | Admitting: Radiation Oncology

## 2021-06-06 ENCOUNTER — Other Ambulatory Visit: Payer: Self-pay

## 2021-06-06 DIAGNOSIS — Z79899 Other long term (current) drug therapy: Secondary | ICD-10-CM | POA: Diagnosis not present

## 2021-06-06 DIAGNOSIS — C328 Malignant neoplasm of overlapping sites of larynx: Secondary | ICD-10-CM | POA: Diagnosis not present

## 2021-06-06 DIAGNOSIS — C321 Malignant neoplasm of supraglottis: Secondary | ICD-10-CM | POA: Diagnosis not present

## 2021-06-06 DIAGNOSIS — Z5111 Encounter for antineoplastic chemotherapy: Secondary | ICD-10-CM | POA: Diagnosis not present

## 2021-06-07 ENCOUNTER — Ambulatory Visit
Admission: RE | Admit: 2021-06-07 | Discharge: 2021-06-07 | Disposition: A | Discharge status: Home / Self Care | Payer: Medicare Other | Source: Ambulatory Visit | Attending: Radiation Oncology | Admitting: Radiation Oncology

## 2021-06-07 DIAGNOSIS — Z79899 Other long term (current) drug therapy: Secondary | ICD-10-CM | POA: Diagnosis not present

## 2021-06-07 DIAGNOSIS — C328 Malignant neoplasm of overlapping sites of larynx: Secondary | ICD-10-CM | POA: Diagnosis not present

## 2021-06-07 DIAGNOSIS — Z5111 Encounter for antineoplastic chemotherapy: Secondary | ICD-10-CM | POA: Diagnosis not present

## 2021-06-07 DIAGNOSIS — C321 Malignant neoplasm of supraglottis: Secondary | ICD-10-CM | POA: Diagnosis not present

## 2021-06-11 ENCOUNTER — Other Ambulatory Visit: Payer: Self-pay

## 2021-06-11 ENCOUNTER — Ambulatory Visit
Admission: RE | Admit: 2021-06-11 | Discharge: 2021-06-11 | Disposition: A | Discharge status: Home / Self Care | Payer: Medicare Other | Source: Ambulatory Visit | Attending: Radiation Oncology | Admitting: Radiation Oncology

## 2021-06-11 DIAGNOSIS — Z5111 Encounter for antineoplastic chemotherapy: Secondary | ICD-10-CM | POA: Diagnosis not present

## 2021-06-11 DIAGNOSIS — Z79899 Other long term (current) drug therapy: Secondary | ICD-10-CM | POA: Insufficient documentation

## 2021-06-11 DIAGNOSIS — C321 Malignant neoplasm of supraglottis: Secondary | ICD-10-CM | POA: Insufficient documentation

## 2021-06-11 DIAGNOSIS — C328 Malignant neoplasm of overlapping sites of larynx: Secondary | ICD-10-CM | POA: Insufficient documentation

## 2021-06-12 ENCOUNTER — Inpatient Hospital Stay: Payer: Medicare Other | Admitting: Dietician

## 2021-06-12 ENCOUNTER — Other Ambulatory Visit: Payer: Self-pay | Admitting: Oncology

## 2021-06-12 ENCOUNTER — Ambulatory Visit
Admission: RE | Admit: 2021-06-12 | Discharge: 2021-06-12 | Disposition: A | Discharge status: Home / Self Care | Payer: Medicare Other | Source: Ambulatory Visit | Attending: Radiation Oncology | Admitting: Radiation Oncology

## 2021-06-12 ENCOUNTER — Other Ambulatory Visit: Payer: Medicare Other

## 2021-06-12 ENCOUNTER — Inpatient Hospital Stay: Payer: Medicare Other

## 2021-06-12 ENCOUNTER — Inpatient Hospital Stay: Payer: Medicare Other | Attending: Oncology

## 2021-06-12 VITALS — BP 133/66 | HR 69 | Temp 98.4°F | Resp 18 | Ht 67.0 in | Wt 128.8 lb

## 2021-06-12 DIAGNOSIS — Z5111 Encounter for antineoplastic chemotherapy: Secondary | ICD-10-CM | POA: Insufficient documentation

## 2021-06-12 DIAGNOSIS — Z95828 Presence of other vascular implants and grafts: Secondary | ICD-10-CM

## 2021-06-12 DIAGNOSIS — C321 Malignant neoplasm of supraglottis: Secondary | ICD-10-CM

## 2021-06-12 DIAGNOSIS — C329 Malignant neoplasm of larynx, unspecified: Secondary | ICD-10-CM

## 2021-06-12 DIAGNOSIS — Z79899 Other long term (current) drug therapy: Secondary | ICD-10-CM | POA: Insufficient documentation

## 2021-06-12 DIAGNOSIS — C328 Malignant neoplasm of overlapping sites of larynx: Secondary | ICD-10-CM | POA: Diagnosis not present

## 2021-06-12 LAB — CBC WITH DIFFERENTIAL (CANCER CENTER ONLY)
Abs Immature Granulocytes: 0.02 10*3/uL (ref 0.00–0.07)
Basophils Absolute: 0 10*3/uL (ref 0.0–0.1)
Basophils Relative: 0 %
Eosinophils Absolute: 0.1 10*3/uL (ref 0.0–0.5)
Eosinophils Relative: 1 %
HCT: 34.4 % — ABNORMAL LOW (ref 39.0–52.0)
Hemoglobin: 12.1 g/dL — ABNORMAL LOW (ref 13.0–17.0)
Immature Granulocytes: 0 %
Lymphocytes Relative: 32 %
Lymphs Abs: 1.9 10*3/uL (ref 0.7–4.0)
MCH: 33.1 pg (ref 26.0–34.0)
MCHC: 35.2 g/dL (ref 30.0–36.0)
MCV: 94 fL (ref 80.0–100.0)
Monocytes Absolute: 0.6 10*3/uL (ref 0.1–1.0)
Monocytes Relative: 10 %
Neutro Abs: 3.3 10*3/uL (ref 1.7–7.7)
Neutrophils Relative %: 57 %
Platelet Count: 148 10*3/uL — ABNORMAL LOW (ref 150–400)
RBC: 3.66 MIL/uL — ABNORMAL LOW (ref 4.22–5.81)
RDW: 12.6 % (ref 11.5–15.5)
WBC Count: 6 10*3/uL (ref 4.0–10.5)
nRBC: 0 % (ref 0.0–0.2)

## 2021-06-12 LAB — MAGNESIUM: Magnesium: 1.9 mg/dL (ref 1.7–2.4)

## 2021-06-12 LAB — CMP (CANCER CENTER ONLY)
ALT: 8 U/L (ref 0–44)
AST: 16 U/L (ref 15–41)
Albumin: 3.9 g/dL (ref 3.5–5.0)
Alkaline Phosphatase: 59 U/L (ref 38–126)
Anion gap: 10 (ref 5–15)
BUN: 18 mg/dL (ref 8–23)
CO2: 25 mmol/L (ref 22–32)
Calcium: 8.8 mg/dL — ABNORMAL LOW (ref 8.9–10.3)
Chloride: 102 mmol/L (ref 98–111)
Creatinine: 0.93 mg/dL (ref 0.61–1.24)
GFR, Estimated: >60 mL/min (ref >60)
Glucose, Bld: 90 mg/dL (ref 70–99)
Potassium: 3.7 mmol/L (ref 3.5–5.1)
Sodium: 137 mmol/L (ref 135–145)
Total Bilirubin: 0.5 mg/dL (ref 0.3–1.2)
Total Protein: 7.1 g/dL (ref 6.5–8.1)

## 2021-06-12 MED ORDER — PALONOSETRON HCL INJECTION 0.25 MG/5ML
0.2500 mg | Freq: Once | INTRAVENOUS | Status: AC
  Administered 2021-06-12: 0.25 mg via INTRAVENOUS
  Filled 2021-06-12: qty 5

## 2021-06-12 MED ORDER — HEPARIN SOD (PORK) LOCK FLUSH 100 UNIT/ML IV SOLN
500.0000 [IU] | Freq: Once | INTRAVENOUS | Status: AC | PRN
  Administered 2021-06-12: 500 [IU]

## 2021-06-12 MED ORDER — SODIUM CHLORIDE 0.9% FLUSH
10.0000 mL | Freq: Once | INTRAVENOUS | Status: AC
  Administered 2021-06-12: 10 mL

## 2021-06-12 MED ORDER — SODIUM CHLORIDE 0.9 % IV SOLN
40.0000 mg/m2 | Freq: Once | INTRAVENOUS | Status: AC
  Administered 2021-06-12: 68 mg via INTRAVENOUS
  Filled 2021-06-12: qty 68

## 2021-06-12 MED ORDER — SODIUM CHLORIDE 0.9% FLUSH
10.0000 mL | INTRAVENOUS | Status: DC | PRN
  Administered 2021-06-12: 10 mL

## 2021-06-12 MED ORDER — POTASSIUM CHLORIDE IN NACL 20-0.9 MEQ/L-% IV SOLN
Freq: Once | INTRAVENOUS | Status: AC
  Filled 2021-06-12: qty 1000

## 2021-06-12 MED ORDER — SODIUM CHLORIDE 0.9 % IV SOLN
10.0000 mg | Freq: Once | INTRAVENOUS | Status: AC
  Administered 2021-06-12: 10 mg via INTRAVENOUS
  Filled 2021-06-12: qty 10

## 2021-06-12 MED ORDER — MAGNESIUM SULFATE 2 GM/50ML IV SOLN
2.0000 g | Freq: Once | INTRAVENOUS | Status: AC
  Administered 2021-06-12: 2 g via INTRAVENOUS
  Filled 2021-06-12: qty 50

## 2021-06-12 MED ORDER — SODIUM CHLORIDE 0.9 % IV SOLN: Freq: Once | INTRAVENOUS | Status: AC

## 2021-06-12 MED ORDER — SODIUM CHLORIDE 0.9 % IV SOLN
150.0000 mg | Freq: Once | INTRAVENOUS | Status: AC
  Administered 2021-06-12: 150 mg via INTRAVENOUS
  Filled 2021-06-12: qty 150

## 2021-06-12 NOTE — Progress Notes (Signed)
Oncology Nurse Navigator Documentation   I went to visit Donald Larson in infusion today to discuss his willingness to meet with the Swallowing Therapist and Physical Therapist as ordered by Dr. Isidore Moos to help with some of his symptoms and future side effects. He had declined to see them at a previous Multidisciplinary Clinic and continues to decline appointments with the above providers today. He is aware that he can contact me anytime to reschedule. I also encouraged Donald Larson to drink as many ensure a day that he could tolerate and stay hydrated. He voiced his understanding and knows to call me with any needs.   Harlow Asa RN, BSN, OCN Head & Neck Oncology Nurse [place] at [facility] Phone # [phone]  Fax # [phone]

## 2021-06-12 NOTE — Progress Notes (Signed)
Pt refused to urinated while in the infusion room this morning. Dr. Alen Blew notified. OK to treat with Cisplatin today without urine output.  1440 : Pt requesting to leave. Post hydration IVF increased to 500cc/hr per VO from Dr. Alen Blew

## 2021-06-12 NOTE — Patient Instructions (Signed)
[facility]  Discharge Instructions: Thank you for choosing [facility] to provide your oncology and hematology care.   If you have a lab appointment with the Cancer Center, please go directly to the Cancer Center and check in at the registration area.   Wear comfortable clothing and clothing appropriate for easy access to any Portacath or PICC line.   We strive to give you quality time with your provider. You may need to reschedule your appointment if you arrive late (15 or more minutes).  Arriving late affects you and other patients whose appointments are after yours.  Also, if you miss three or more appointments without notifying the office, you may be dismissed from the clinic at the provider's discretion.      For prescription refill requests, have your pharmacy contact our office and allow 72 hours for refills to be completed.    Today you received the following chemotherapy and/or immunotherapy agents : Cisplatin    To help prevent nausea and vomiting after your treatment, we encourage you to take your nausea medication as directed.  BELOW ARE SYMPTOMS THAT SHOULD BE REPORTED IMMEDIATELY: *FEVER GREATER THAN 100.4 F (38 C) OR HIGHER *CHILLS OR SWEATING *NAUSEA AND VOMITING THAT IS NOT CONTROLLED WITH YOUR NAUSEA MEDICATION *UNUSUAL SHORTNESS OF BREATH *UNUSUAL BRUISING OR BLEEDING *URINARY PROBLEMS (pain or burning when urinating, or frequent urination) *BOWEL PROBLEMS (unusual diarrhea, constipation, pain near the anus) TENDERNESS IN MOUTH AND THROAT WITH OR WITHOUT PRESENCE OF ULCERS (sore throat, sores in mouth, or a toothache) UNUSUAL RASH, SWELLING OR PAIN  UNUSUAL VAGINAL DISCHARGE OR ITCHING   Items with * indicate a potential emergency and should be followed up as soon as possible or go to the Emergency Department if any problems should occur.  Please show the CHEMOTHERAPY ALERT CARD or IMMUNOTHERAPY ALERT CARD at check-in to  the Emergency Department and triage nurse.  Should you have questions after your visit or need to cancel or reschedule your appointment, please contact [facility]  Dept: [phone]  and follow the prompts.  Office hours are 8:00 a.m. to 4:30 p.m. Monday - Friday. Please note that voicemails left after 4:00 p.m. may not be returned until the following business day.  We are closed weekends and major holidays. You have access to a nurse at all times for urgent questions. Please call the main number to the clinic Dept: [phone] and follow the prompts.   For any non-urgent questions, you may also contact your provider using MyChart. We now offer e-Visits for anyone 18 and older to request care online for non-urgent symptoms. For details visit [email or web address].   Also download the MyChart app! Go to the app store, search "MyChart", open the app, select [facility], and log in with your MyChart username and password.  Due to Covid, a mask is required upon entering the hospital/clinic. If you do not have a mask, one will be given to you upon arrival. For doctor visits, patients may have 1 support person aged 18 or older with them. For treatment visits, patients cannot have anyone with them due to current Covid guidelines and our immunocompromised population.   

## 2021-06-12 NOTE — Progress Notes (Signed)
Nutrition Follow-up:  Patient receiving concurrent chemoradiation with weekly cisplatin for SCC of right aryepiglottic fold.   Met with patient during infusion. He reports swallowing is becoming more difficult, his throat is sore. Patient reports "nothing taste good." Patient has not tried the Ensure samples provided during last visit. If he is unable to eat the foods he knows he likes, then he does not see the good in trying something new. Patient agreeable to drinking Ensure Complete during infusion today. Discussed consideration of feeding tube. Patient reports appreciation of concern and care. If he is unable to eat/drink by mouth then "he would rather give up and call it quits" Patient denies nausea, vomiting, diarrhea, constipation.    Medications: reviewed  Labs: reviewed  Anthropometrics: Last aria weight 127.4 lb today decreased from 132 lb on 12/27   3.5% weight decrease in one week; significant   NUTRITION DIAGNOSIS: Predicted suboptimal intake has evolved into inadequate oral intake related to cancer and associated treatment side effects as evidenced by reported throat soreness, swallowing difficulties, altered taste of foods, 3.5% weight loss in one week which is significant for time frame.    INTERVENTION:  Reinforced importance of adequate calories and protein to maintain weight, strength Encouraged soft, moist, smooth high calorie, high protein foods Reviewed strategies for altering texture of foods for ease of intake - pt has handout and shake recipes Recommend drinking 4-5 Ensure Enlive daily (350 kcal, 20 grams protein each) Provided one complimentary case of Ensure Enlive today  Discussed feeding tube placement - pt is not interested in considering this  Encouraged baking soda, salt water rinses several times daily Patient has contact information     MONITORING, EVALUATION, GOAL: weight trends, intake   NEXT VISIT: Wednesday January 11 during infusion

## 2021-06-13 ENCOUNTER — Ambulatory Visit
Admission: RE | Admit: 2021-06-13 | Discharge: 2021-06-13 | Disposition: A | Discharge status: Home / Self Care | Payer: Medicare Other | Source: Ambulatory Visit | Attending: Radiation Oncology | Admitting: Radiation Oncology

## 2021-06-13 ENCOUNTER — Other Ambulatory Visit: Payer: Self-pay

## 2021-06-13 DIAGNOSIS — Z5111 Encounter for antineoplastic chemotherapy: Secondary | ICD-10-CM | POA: Diagnosis not present

## 2021-06-13 DIAGNOSIS — C321 Malignant neoplasm of supraglottis: Secondary | ICD-10-CM | POA: Diagnosis not present

## 2021-06-13 DIAGNOSIS — Z79899 Other long term (current) drug therapy: Secondary | ICD-10-CM | POA: Diagnosis not present

## 2021-06-13 DIAGNOSIS — C328 Malignant neoplasm of overlapping sites of larynx: Secondary | ICD-10-CM | POA: Diagnosis not present

## 2021-06-14 ENCOUNTER — Ambulatory Visit
Admission: RE | Admit: 2021-06-14 | Discharge: 2021-06-14 | Disposition: A | Discharge status: Home / Self Care | Payer: Medicare Other | Source: Ambulatory Visit | Attending: Radiation Oncology | Admitting: Radiation Oncology

## 2021-06-14 DIAGNOSIS — C328 Malignant neoplasm of overlapping sites of larynx: Secondary | ICD-10-CM | POA: Diagnosis not present

## 2021-06-14 DIAGNOSIS — C321 Malignant neoplasm of supraglottis: Secondary | ICD-10-CM | POA: Diagnosis not present

## 2021-06-14 DIAGNOSIS — Z5111 Encounter for antineoplastic chemotherapy: Secondary | ICD-10-CM | POA: Diagnosis not present

## 2021-06-14 DIAGNOSIS — Z79899 Other long term (current) drug therapy: Secondary | ICD-10-CM | POA: Diagnosis not present

## 2021-06-17 ENCOUNTER — Other Ambulatory Visit: Payer: Self-pay

## 2021-06-17 ENCOUNTER — Ambulatory Visit
Admission: RE | Admit: 2021-06-17 | Discharge: 2021-06-17 | Disposition: A | Discharge status: Home / Self Care | Payer: Medicare Other | Source: Ambulatory Visit | Attending: Radiation Oncology | Admitting: Radiation Oncology

## 2021-06-17 DIAGNOSIS — Z5111 Encounter for antineoplastic chemotherapy: Secondary | ICD-10-CM | POA: Diagnosis not present

## 2021-06-17 DIAGNOSIS — C328 Malignant neoplasm of overlapping sites of larynx: Secondary | ICD-10-CM | POA: Diagnosis not present

## 2021-06-17 DIAGNOSIS — C321 Malignant neoplasm of supraglottis: Secondary | ICD-10-CM | POA: Diagnosis not present

## 2021-06-17 DIAGNOSIS — Z79899 Other long term (current) drug therapy: Secondary | ICD-10-CM | POA: Diagnosis not present

## 2021-06-18 ENCOUNTER — Ambulatory Visit: Payer: Medicare Other

## 2021-06-18 MED FILL — Dexamethasone Sodium Phosphate Inj 100 MG/10ML: INTRAMUSCULAR | Qty: 1 | Status: AC

## 2021-06-18 MED FILL — Fosaprepitant Dimeglumine For IV Infusion 150 MG (Base Eq): INTRAVENOUS | Qty: 5 | Status: AC

## 2021-06-19 ENCOUNTER — Ambulatory Visit
Admission: RE | Admit: 2021-06-19 | Discharge: 2021-06-19 | Disposition: A | Discharge status: Home / Self Care | Payer: Medicare Other | Source: Ambulatory Visit | Attending: Radiation Oncology | Admitting: Radiation Oncology

## 2021-06-19 ENCOUNTER — Other Ambulatory Visit: Payer: Medicare Other

## 2021-06-19 ENCOUNTER — Inpatient Hospital Stay: Payer: Medicare Other

## 2021-06-19 ENCOUNTER — Inpatient Hospital Stay (HOSPITAL_BASED_OUTPATIENT_CLINIC_OR_DEPARTMENT_OTHER): Payer: Medicare Other | Admitting: Oncology

## 2021-06-19 ENCOUNTER — Inpatient Hospital Stay: Payer: Medicare Other | Admitting: Dietician

## 2021-06-19 ENCOUNTER — Other Ambulatory Visit: Payer: Self-pay

## 2021-06-19 ENCOUNTER — Other Ambulatory Visit: Payer: Self-pay | Admitting: *Deleted

## 2021-06-19 VITALS — BP 132/71 | HR 75 | Temp 98.2°F | Resp 20 | Wt 126.1 lb

## 2021-06-19 DIAGNOSIS — Z95828 Presence of other vascular implants and grafts: Secondary | ICD-10-CM | POA: Diagnosis not present

## 2021-06-19 DIAGNOSIS — C329 Malignant neoplasm of larynx, unspecified: Secondary | ICD-10-CM

## 2021-06-19 DIAGNOSIS — C321 Malignant neoplasm of supraglottis: Secondary | ICD-10-CM

## 2021-06-19 DIAGNOSIS — C328 Malignant neoplasm of overlapping sites of larynx: Secondary | ICD-10-CM | POA: Diagnosis not present

## 2021-06-19 DIAGNOSIS — Z79899 Other long term (current) drug therapy: Secondary | ICD-10-CM | POA: Diagnosis not present

## 2021-06-19 DIAGNOSIS — Z5111 Encounter for antineoplastic chemotherapy: Secondary | ICD-10-CM | POA: Diagnosis not present

## 2021-06-19 LAB — CMP (CANCER CENTER ONLY)
ALT: 16 U/L (ref 0–44)
AST: 23 U/L (ref 15–41)
Albumin: 3.8 g/dL (ref 3.5–5.0)
Alkaline Phosphatase: 52 U/L (ref 38–126)
Anion gap: 7 (ref 5–15)
BUN: 16 mg/dL (ref 8–23)
CO2: 26 mmol/L (ref 22–32)
Calcium: 8.5 mg/dL — ABNORMAL LOW (ref 8.9–10.3)
Chloride: 104 mmol/L (ref 98–111)
Creatinine: 0.95 mg/dL (ref 0.61–1.24)
GFR, Estimated: >60 mL/min (ref >60)
Glucose, Bld: 108 mg/dL — ABNORMAL HIGH (ref 70–99)
Potassium: 3.7 mmol/L (ref 3.5–5.1)
Sodium: 137 mmol/L (ref 135–145)
Total Bilirubin: 0.4 mg/dL (ref 0.3–1.2)
Total Protein: 6.6 g/dL (ref 6.5–8.1)

## 2021-06-19 LAB — CBC WITH DIFFERENTIAL (CANCER CENTER ONLY)
Abs Immature Granulocytes: 0.01 10*3/uL (ref 0.00–0.07)
Basophils Absolute: 0 10*3/uL (ref 0.0–0.1)
Basophils Relative: 0 %
Eosinophils Absolute: 0.1 10*3/uL (ref 0.0–0.5)
Eosinophils Relative: 2 %
HCT: 32.4 % — ABNORMAL LOW (ref 39.0–52.0)
Hemoglobin: 11.6 g/dL — ABNORMAL LOW (ref 13.0–17.0)
Immature Granulocytes: 0 %
Lymphocytes Relative: 28 %
Lymphs Abs: 1.5 10*3/uL (ref 0.7–4.0)
MCH: 33.4 pg (ref 26.0–34.0)
MCHC: 35.8 g/dL (ref 30.0–36.0)
MCV: 93.4 fL (ref 80.0–100.0)
Monocytes Absolute: 0.5 10*3/uL (ref 0.1–1.0)
Monocytes Relative: 9 %
Neutro Abs: 3.3 10*3/uL (ref 1.7–7.7)
Neutrophils Relative %: 61 %
Platelet Count: 109 10*3/uL — ABNORMAL LOW (ref 150–400)
RBC: 3.47 MIL/uL — ABNORMAL LOW (ref 4.22–5.81)
RDW: 12.9 % (ref 11.5–15.5)
WBC Count: 5.3 10*3/uL (ref 4.0–10.5)
nRBC: 0 % (ref 0.0–0.2)

## 2021-06-19 MED ORDER — SODIUM CHLORIDE 0.9 % IV SOLN: Freq: Once | INTRAVENOUS | Status: AC

## 2021-06-19 MED ORDER — PALONOSETRON HCL INJECTION 0.25 MG/5ML
0.2500 mg | Freq: Once | INTRAVENOUS | Status: AC
  Administered 2021-06-19: 0.25 mg via INTRAVENOUS
  Filled 2021-06-19: qty 5

## 2021-06-19 MED ORDER — SODIUM CHLORIDE 0.9% FLUSH
10.0000 mL | Freq: Once | INTRAVENOUS | Status: AC
  Administered 2021-06-19: 10 mL

## 2021-06-19 MED ORDER — SODIUM CHLORIDE 0.9 % IV SOLN
40.0000 mg/m2 | Freq: Once | INTRAVENOUS | Status: AC
  Administered 2021-06-19: 68 mg via INTRAVENOUS
  Filled 2021-06-19: qty 68

## 2021-06-19 MED ORDER — POTASSIUM CHLORIDE IN NACL 20-0.9 MEQ/L-% IV SOLN
Freq: Once | INTRAVENOUS | Status: AC
  Filled 2021-06-19: qty 1000

## 2021-06-19 MED ORDER — SODIUM CHLORIDE 0.9 % IV SOLN
150.0000 mg | Freq: Once | INTRAVENOUS | Status: AC
  Administered 2021-06-19: 150 mg via INTRAVENOUS
  Filled 2021-06-19: qty 150

## 2021-06-19 MED ORDER — MAGNESIUM SULFATE 2 GM/50ML IV SOLN
2.0000 g | Freq: Once | INTRAVENOUS | Status: AC
  Administered 2021-06-19: 2 g via INTRAVENOUS
  Filled 2021-06-19: qty 50

## 2021-06-19 MED ORDER — PROCHLORPERAZINE MALEATE 10 MG PO TABS: 10.0000 mg | ORAL_TABLET | Freq: Four times a day (QID) | ORAL | 0 refills | Status: DC | PRN

## 2021-06-19 MED ORDER — SODIUM CHLORIDE 0.9 % IV SOLN
10.0000 mg | Freq: Once | INTRAVENOUS | Status: AC
  Administered 2021-06-19: 10 mg via INTRAVENOUS
  Filled 2021-06-19: qty 10

## 2021-06-19 NOTE — Patient Instructions (Signed)
[facility]  Discharge Instructions: Thank you for choosing [facility] to provide your oncology and hematology care.   If you have a lab appointment with the Cancer Center, please go directly to the Cancer Center and check in at the registration area.   Wear comfortable clothing and clothing appropriate for easy access to any Portacath or PICC line.   We strive to give you quality time with your provider. You may need to reschedule your appointment if you arrive late (15 or more minutes).  Arriving late affects you and other patients whose appointments are after yours.  Also, if you miss three or more appointments without notifying the office, you may be dismissed from the clinic at the provider's discretion.      For prescription refill requests, have your pharmacy contact our office and allow 72 hours for refills to be completed.    Today you received the following chemotherapy and/or immunotherapy agents : Cisplatin    To help prevent nausea and vomiting after your treatment, we encourage you to take your nausea medication as directed.  BELOW ARE SYMPTOMS THAT SHOULD BE REPORTED IMMEDIATELY: *FEVER GREATER THAN 100.4 F (38 C) OR HIGHER *CHILLS OR SWEATING *NAUSEA AND VOMITING THAT IS NOT CONTROLLED WITH YOUR NAUSEA MEDICATION *UNUSUAL SHORTNESS OF BREATH *UNUSUAL BRUISING OR BLEEDING *URINARY PROBLEMS (pain or burning when urinating, or frequent urination) *BOWEL PROBLEMS (unusual diarrhea, constipation, pain near the anus) TENDERNESS IN MOUTH AND THROAT WITH OR WITHOUT PRESENCE OF ULCERS (sore throat, sores in mouth, or a toothache) UNUSUAL RASH, SWELLING OR PAIN  UNUSUAL VAGINAL DISCHARGE OR ITCHING   Items with * indicate a potential emergency and should be followed up as soon as possible or go to the Emergency Department if any problems should occur.  Please show the CHEMOTHERAPY ALERT CARD or IMMUNOTHERAPY ALERT CARD at check-in to  the Emergency Department and triage nurse.  Should you have questions after your visit or need to cancel or reschedule your appointment, please contact [facility]  Dept: [phone]  and follow the prompts.  Office hours are 8:00 a.m. to 4:30 p.m. Monday - Friday. Please note that voicemails left after 4:00 p.m. may not be returned until the following business day.  We are closed weekends and major holidays. You have access to a nurse at all times for urgent questions. Please call the main number to the clinic Dept: [phone] and follow the prompts.   For any non-urgent questions, you may also contact your provider using MyChart. We now offer e-Visits for anyone 18 and older to request care online for non-urgent symptoms. For details visit [email or web address].   Also download the MyChart app! Go to the app store, search "MyChart", open the app, select [facility], and log in with your MyChart username and password.  Due to Covid, a mask is required upon entering the hospital/clinic. If you do not have a mask, one will be given to you upon arrival. For doctor visits, patients may have 1 support person aged 18 or older with them. For treatment visits, patients cannot have anyone with them due to current Covid guidelines and our immunocompromised population.   

## 2021-06-19 NOTE — Progress Notes (Signed)
Nutrition Follow-up:  Patient receiving concurrent chemoradiation with weekly cisplatin for SCC of right aryepiglottic fold. Patient has declined feeding tube.   Met with patient during infusion. Patient reports ongoing sore throat and pain with swallowing. Patient reports he is eating "nothing" He would like nothing more than a steak or pork chop. Patient is doubtful he will ever get to eat these foods again. Patient is not drinking Ensure supplements. He was given complimentary case on 1/04, reports he has consumed 2 in the last week. Patient states he thought he was tough and could handle this, but he was wrong. Patient will consider feeding tube after "seeing what next week brings" Patient is ready to "get this over with today and go home to suffer in silence." Patient requested soda to drink during infusion, he declined drinking Ensure.    Medications: Compazine  Labs: Glucose 108  Anthropometrics: Weight 126 lb 1 oz today   128 lb 12.8 oz on 1/4 131 lb 9.6 oz on 12/28 132 lb 8 oz on 12/21 133 lb 8 oz on 12/14  Weights have decreased 2.2% in one  week, 5.6% in the last 4 weeks; significant   Nutrition Focused Physical Exam: Severe fat depletion - orbital, buccal, thoracic regions Moderate muscle depletion - temple region Severe muscle depletion - clavicle, dorsal hand  Estimated Energy Needs  Kcals: 2000-2200 Protein: 100-110 Fluid: 2 L  NUTRITION DIAGNOSIS: Inadequate oral intake continues    MALNUTRITION DIAGNOSIS: At this time, patient meets criteria for 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

## 2021-06-19 NOTE — Progress Notes (Signed)
Hematology and Oncology Follow Up Visit  Donald Larson [id number] 09/15/1945 76 y.o. 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  He was found to have T1N2 disease.   Prior Therapy: He is status post directed endoscopy at that time revealed a soft tissue mass in the right aryepiglottic fold and neck mass measuring 2 cm on examination.  Biopsy at that time revealed a moderately differentiated invasive squamous cell carcinoma with fine-needle aspiration of the right supraglottic neck mass revealed malignant squamous cell carcinoma.     Current therapy:  Radiation therapy and weekly cisplatin started on May 22, 2021.  He is here for cycle 5 of therapy.  Interim History: Donald Larson presents today for return evaluation.  Since last visit, he reports no major changes in his health.  He has tolerated therapy with few complaints.  He does report some fatigue and oral pain and has lost more weight.  His performance status quality of life remains unchanged.  He denies any nausea, vomiting or worsening neuropathy.  He denies any diarrhea.     Medications: Updated on review. Current Outpatient Medications  Medication Sig Dispense Refill   amLODipine (NORVASC) 10 MG tablet Take 10 mg by mouth daily.     Aspirin-Caffeine (BC FAST PAIN RELIEF PO) Take 2 packets by mouth daily as needed (pain).     hydrochlorothiazide (HYDRODIURIL) 12.5 MG tablet Take 12.5 mg by mouth daily as needed (swelling).     lidocaine (XYLOCAINE) 2 % solution Patient: Mix 1part 2% viscous lidocaine, 1part H20. Swish & swallow 64mL of diluted mixture, 62min before meals and at bedtime, up to QID for soreness. 200 mL 3   omeprazole (PRILOSEC OTC) 20 MG tablet Take 20 mg by mouth See admin instructions. Take 20 mg daily, may take a second 20 mg dose as needed for acid reflux     Oxycodone HCl 10  MG TABS Take 10 mg by mouth 2 (two) times daily.     prochlorperazine (COMPAZINE) 10 MG tablet Take 1 tablet (10 mg total) by mouth every 6 (six) hours as needed for nausea or vomiting. 30 tablet 0   No current facility-administered medications for this visit.     Allergies: No Known Allergies   Physical Exam: Blood pressure 132/71, pulse 75, temperature 98.2 F (36.8 C), temperature source Oral, resp. rate 20, weight 126 lb 1 oz (57.2 kg), SpO2 96 %.  ECOG: 1   General appearance: Alert, awake without any distress. Head: Atraumatic without abnormalities Oropharynx: Without any thrush or ulcers. Eyes: No scleral icterus. Lymph nodes: No lymphadenopathy noted in the cervical, supraclavicular, or axillary nodes Heart:regular rate and rhythm, without any murmurs or gallops.   Lung: Clear to auscultation without any rhonchi, wheezes or dullness to percussion. Abdomin: Soft, nontender without any shifting dullness or ascites. Musculoskeletal: No clubbing or cyanosis. Neurological: No motor or sensory deficits. Skin: No rashes or lesions.     Lab Results: Lab Results  Component Value Date   WBC 5.3 06/19/2021   HGB 11.6 (L) 06/19/2021   HCT 32.4 (L) 06/19/2021   MCV 93.4 06/19/2021   PLT 109 (L) 06/19/2021     Chemistry      Component Value Date/Time   NA 137 06/12/2021 0853   K 3.7 06/12/2021 0853   CL 102 06/12/2021 0853   CO2 25 06/12/2021 0853   BUN 18 06/12/2021 0853  CREATININE 0.93 06/12/2021 0853   CREATININE 0.81 11/23/2014 1504      Component Value Date/Time   CALCIUM 8.8 (L) 06/12/2021 0853   ALKPHOS 59 06/12/2021 0853   AST 16 06/12/2021 0853   ALT 8 06/12/2021 0853   BILITOT 0.5 06/12/2021 0853        Impression and Plan:   76 year old with:  1.  Squamous cell carcinoma of the right aryepiglottic fold presented with T1N2 disease in September 2022.   Risks and benefits of continuing treatment were reviewed at this time.  Laboratory data  showed adequate hematological parameters.  Complications include nausea, vomiting, myelosuppression among other complaints were reiterated.  He is agreeable to proceed and will conclude cisplatin therapy in 1 week.   2.  Kidney function surveillance: Kidney function appears adequate at this time and no additional intervention is needed.   3.  IV access: No complications noted related to his Port-A-Cath at this time.   4.  Nutritional status: He has lost a few pounds since last visit.  We have discussed strategies to improve his nutritional intake at this time.   5.  Antiemetics: Compazine is available to him without any nausea or vomiting.   6.  Follow-up: He will continue daily radiation and received cycle 6 of therapy in 1 week.   30  minutes were dedicated to this visit.  Time was spent on reviewing laboratory data, disease status update and outlining future plan of care discussion.          Zola Button, MD 1/11/20238:08 AM

## 2021-06-19 NOTE — Progress Notes (Signed)
Ok to treat without mag labs today per Dr. Alen Blew. Patient refusing to urinate for his cisplatin treatment. Doctor is aware and ok to treat without urine output.

## 2021-06-20 ENCOUNTER — Ambulatory Visit
Admission: RE | Admit: 2021-06-20 | Discharge: 2021-06-20 | Disposition: A | Discharge status: Home / Self Care | Payer: Medicare Other | Source: Ambulatory Visit | Attending: Radiation Oncology | Admitting: Radiation Oncology

## 2021-06-20 DIAGNOSIS — Z5111 Encounter for antineoplastic chemotherapy: Secondary | ICD-10-CM | POA: Diagnosis not present

## 2021-06-20 DIAGNOSIS — C321 Malignant neoplasm of supraglottis: Secondary | ICD-10-CM | POA: Diagnosis not present

## 2021-06-20 DIAGNOSIS — Z79899 Other long term (current) drug therapy: Secondary | ICD-10-CM | POA: Diagnosis not present

## 2021-06-20 DIAGNOSIS — C328 Malignant neoplasm of overlapping sites of larynx: Secondary | ICD-10-CM | POA: Diagnosis not present

## 2021-06-21 ENCOUNTER — Other Ambulatory Visit: Payer: Self-pay

## 2021-06-21 ENCOUNTER — Ambulatory Visit
Admission: RE | Admit: 2021-06-21 | Discharge: 2021-06-21 | Disposition: A | Discharge status: Home / Self Care | Payer: Medicare Other | Source: Ambulatory Visit | Attending: Radiation Oncology | Admitting: Radiation Oncology

## 2021-06-21 DIAGNOSIS — Z5111 Encounter for antineoplastic chemotherapy: Secondary | ICD-10-CM | POA: Diagnosis not present

## 2021-06-21 DIAGNOSIS — C328 Malignant neoplasm of overlapping sites of larynx: Secondary | ICD-10-CM | POA: Diagnosis not present

## 2021-06-21 DIAGNOSIS — C321 Malignant neoplasm of supraglottis: Secondary | ICD-10-CM | POA: Diagnosis not present

## 2021-06-21 DIAGNOSIS — Z79899 Other long term (current) drug therapy: Secondary | ICD-10-CM | POA: Diagnosis not present

## 2021-06-24 ENCOUNTER — Ambulatory Visit
Admission: RE | Admit: 2021-06-24 | Discharge: 2021-06-24 | Disposition: A | Discharge status: Home / Self Care | Payer: Medicare Other | Source: Ambulatory Visit | Attending: Radiation Oncology | Admitting: Radiation Oncology

## 2021-06-24 ENCOUNTER — Other Ambulatory Visit: Payer: Self-pay

## 2021-06-24 DIAGNOSIS — Z79899 Other long term (current) drug therapy: Secondary | ICD-10-CM | POA: Diagnosis not present

## 2021-06-24 DIAGNOSIS — Z5111 Encounter for antineoplastic chemotherapy: Secondary | ICD-10-CM | POA: Diagnosis not present

## 2021-06-24 DIAGNOSIS — C321 Malignant neoplasm of supraglottis: Secondary | ICD-10-CM | POA: Diagnosis not present

## 2021-06-24 DIAGNOSIS — C328 Malignant neoplasm of overlapping sites of larynx: Secondary | ICD-10-CM | POA: Diagnosis not present

## 2021-06-25 ENCOUNTER — Ambulatory Visit
Admission: RE | Admit: 2021-06-25 | Discharge: 2021-06-25 | Disposition: A | Discharge status: Home / Self Care | Payer: Medicare Other | Source: Ambulatory Visit | Attending: Radiation Oncology | Admitting: Radiation Oncology

## 2021-06-25 DIAGNOSIS — C328 Malignant neoplasm of overlapping sites of larynx: Secondary | ICD-10-CM | POA: Diagnosis not present

## 2021-06-25 DIAGNOSIS — Z5111 Encounter for antineoplastic chemotherapy: Secondary | ICD-10-CM | POA: Diagnosis not present

## 2021-06-25 DIAGNOSIS — Z79899 Other long term (current) drug therapy: Secondary | ICD-10-CM | POA: Diagnosis not present

## 2021-06-25 DIAGNOSIS — C321 Malignant neoplasm of supraglottis: Secondary | ICD-10-CM | POA: Diagnosis not present

## 2021-06-25 MED FILL — Fosaprepitant Dimeglumine For IV Infusion 150 MG (Base Eq): INTRAVENOUS | Qty: 5 | Status: AC

## 2021-06-25 MED FILL — Dexamethasone Sodium Phosphate Inj 100 MG/10ML: INTRAMUSCULAR | Qty: 1 | Status: AC

## 2021-06-26 ENCOUNTER — Other Ambulatory Visit: Payer: Medicare Other

## 2021-06-26 ENCOUNTER — Inpatient Hospital Stay: Payer: Medicare Other | Admitting: Dietician

## 2021-06-26 ENCOUNTER — Ambulatory Visit: Payer: Medicare Other | Admitting: Oncology

## 2021-06-26 ENCOUNTER — Other Ambulatory Visit: Payer: Self-pay | Admitting: Oncology

## 2021-06-26 ENCOUNTER — Inpatient Hospital Stay: Payer: Medicare Other

## 2021-06-26 ENCOUNTER — Other Ambulatory Visit: Payer: Self-pay

## 2021-06-26 ENCOUNTER — Ambulatory Visit
Admission: RE | Admit: 2021-06-26 | Discharge: 2021-06-26 | Disposition: A | Discharge status: Home / Self Care | Payer: Medicare Other | Source: Ambulatory Visit | Attending: Radiation Oncology | Admitting: Radiation Oncology

## 2021-06-26 VITALS — BP 140/67 | HR 70 | Temp 98.9°F | Resp 18 | Wt 119.0 lb

## 2021-06-26 DIAGNOSIS — Z95828 Presence of other vascular implants and grafts: Secondary | ICD-10-CM

## 2021-06-26 DIAGNOSIS — C329 Malignant neoplasm of larynx, unspecified: Secondary | ICD-10-CM

## 2021-06-26 DIAGNOSIS — Z79899 Other long term (current) drug therapy: Secondary | ICD-10-CM | POA: Diagnosis not present

## 2021-06-26 DIAGNOSIS — C328 Malignant neoplasm of overlapping sites of larynx: Secondary | ICD-10-CM | POA: Diagnosis not present

## 2021-06-26 DIAGNOSIS — Z5111 Encounter for antineoplastic chemotherapy: Secondary | ICD-10-CM | POA: Diagnosis not present

## 2021-06-26 DIAGNOSIS — C321 Malignant neoplasm of supraglottis: Secondary | ICD-10-CM | POA: Diagnosis not present

## 2021-06-26 LAB — CBC WITH DIFFERENTIAL (CANCER CENTER ONLY)
Abs Immature Granulocytes: 0.02 10*3/uL (ref 0.00–0.07)
Basophils Absolute: 0 10*3/uL (ref 0.0–0.1)
Basophils Relative: 0 %
Eosinophils Absolute: 0 10*3/uL (ref 0.0–0.5)
Eosinophils Relative: 1 %
HCT: 34.9 % — ABNORMAL LOW (ref 39.0–52.0)
Hemoglobin: 12.1 g/dL — ABNORMAL LOW (ref 13.0–17.0)
Immature Granulocytes: 1 %
Lymphocytes Relative: 22 %
Lymphs Abs: 0.9 10*3/uL (ref 0.7–4.0)
MCH: 32.5 pg (ref 26.0–34.0)
MCHC: 34.7 g/dL (ref 30.0–36.0)
MCV: 93.8 fL (ref 80.0–100.0)
Monocytes Absolute: 0.5 10*3/uL (ref 0.1–1.0)
Monocytes Relative: 13 %
Neutro Abs: 2.7 10*3/uL (ref 1.7–7.7)
Neutrophils Relative %: 63 %
Platelet Count: 128 10*3/uL — ABNORMAL LOW (ref 150–400)
RBC: 3.72 MIL/uL — ABNORMAL LOW (ref 4.22–5.81)
RDW: 13.4 % (ref 11.5–15.5)
WBC Count: 4.2 10*3/uL (ref 4.0–10.5)
nRBC: 0 % (ref 0.0–0.2)

## 2021-06-26 LAB — CMP (CANCER CENTER ONLY)
ALT: 13 U/L (ref 0–44)
AST: 22 U/L (ref 15–41)
Albumin: 4.1 g/dL (ref 3.5–5.0)
Alkaline Phosphatase: 67 U/L (ref 38–126)
Anion gap: 13 (ref 5–15)
BUN: 18 mg/dL (ref 8–23)
CO2: 25 mmol/L (ref 22–32)
Calcium: 9 mg/dL (ref 8.9–10.3)
Chloride: 101 mmol/L (ref 98–111)
Creatinine: 1.22 mg/dL (ref 0.61–1.24)
GFR, Estimated: >60 mL/min (ref >60)
Glucose, Bld: 74 mg/dL (ref 70–99)
Potassium: 3.9 mmol/L (ref 3.5–5.1)
Sodium: 139 mmol/L (ref 135–145)
Total Bilirubin: 0.6 mg/dL (ref 0.3–1.2)
Total Protein: 7.2 g/dL (ref 6.5–8.1)

## 2021-06-26 MED ORDER — SODIUM CHLORIDE 0.9% FLUSH
10.0000 mL | Freq: Once | INTRAVENOUS | Status: AC
  Administered 2021-06-26: 10 mL

## 2021-06-26 MED ORDER — ONDANSETRON HCL 4 MG/2ML IJ SOLN
8.0000 mg | Freq: Once | INTRAMUSCULAR | Status: AC
  Administered 2021-06-26: 8 mg via INTRAVENOUS
  Filled 2021-06-26: qty 4

## 2021-06-26 MED ORDER — SODIUM CHLORIDE 0.9% FLUSH
10.0000 mL | Freq: Once | INTRAVENOUS | Status: AC | PRN
  Administered 2021-06-26: 10 mL

## 2021-06-26 MED ORDER — SODIUM CHLORIDE 0.9 % IV SOLN: Freq: Once | INTRAVENOUS | Status: DC

## 2021-06-26 MED ORDER — HEPARIN SOD (PORK) LOCK FLUSH 100 UNIT/ML IV SOLN: 250.0000 [IU] | Freq: Once | INTRAVENOUS | Status: DC | PRN

## 2021-06-26 MED ORDER — ALTEPLASE 2 MG IJ SOLR: 2.0000 mg | Freq: Once | INTRAMUSCULAR | Status: DC | PRN

## 2021-06-26 MED ORDER — SODIUM CHLORIDE 0.9 % IV SOLN: Freq: Once | INTRAVENOUS | Status: AC

## 2021-06-26 MED ORDER — SODIUM CHLORIDE 0.9% FLUSH: 3.0000 mL | Freq: Once | INTRAVENOUS | Status: DC | PRN

## 2021-06-26 MED ORDER — HEPARIN SOD (PORK) LOCK FLUSH 100 UNIT/ML IV SOLN
500.0000 [IU] | Freq: Once | INTRAVENOUS | Status: AC | PRN
  Administered 2021-06-26: 500 [IU]

## 2021-06-26 NOTE — Patient Instructions (Signed)

## 2021-06-26 NOTE — Progress Notes (Signed)
Per Dr.Shadad no tx today only fluids and zofran. Orders in place. Pt agreeable

## 2021-06-26 NOTE — Progress Notes (Signed)
Nutrition Follow-up: ° °Patient receiving concurrent chemoradiation with weekly cisplatin for SCC of right aryepiglottic fold. Patient has declined feeding tube.  ° -Chemotherapy held today, pt receiving IV fluids.  ° °Met with patient during infusion. He reports ongoing minimal oral intake. Patient is not drinking Ensure supplements, states if I could drink them don't you think I would be. He is drinking coffee. This burns his throat. Patient states there is no point in trying to eat when it's going to come right back up. Patient recalls attempt to eat half of a hotdog yesterday resulted in episode of vomiting after the first swallow. He is not taking nausea medication. Patient continues to adamantly decline placement of feeding tube. He reports it is too late for that now.  ° °Medications: reviewed  ° °Labs: reviewed ° °Anthropometrics: Weights continue to trend down. Weight 119 lb today decreased 5.6% (7 lbs) in the last 7 days and 10.9% (14.5 lbs) over the past 5 weeks; significant ° ° °126 lb on 1/11 °128 lb 12.8 oz on 1/4 °131 lb 9.6 oz on 12/28 °132 lb 8 oz on 12/21 °133 lb 8 oz on 12/14 ° °NUTRITION DIAGNOSIS: Inadequate oral intake ongoing  ° ° °MALNUTRITION DIAGNOSIS: Severe malnutrition continues  ° ° °INTERVENTION:  °Ongoing education about importance of nutrition and benefits of short term placement of feeding tube  °Continued encouragement for frequent small sips of Ensure and bites of soft smooth foods - pt has shake recipes and soft moist high protein food ideas °Encouraged taking nausea medication, suggested waiting 30 minutes before trying oral intake  °Patient is currently scheduled for daily IV hydration plus antiemetics  °Provided additional copy of contact information °  ° °MONITORING, EVALUATION, GOAL: weight trends, oral intake ° ° °NEXT VISIT: Will plan to call patient on Monday January 24 ° ° ° °

## 2021-06-27 ENCOUNTER — Ambulatory Visit: Payer: Medicare Other

## 2021-06-28 ENCOUNTER — Ambulatory Visit: Payer: Medicare Other

## 2021-06-28 NOTE — Progress Notes (Signed)
Oncology Nurse Navigator Documentation   I have made multiple attempts to contact Mr. Donald Larson today due to not showing for his radiation treatments yesterday or today. I have tried 4 numbers listed in the demographic section of his chart and not been able to talk to him because he has not answered and voice mail has not been set up for any of the numbers. I will continue to attempt to contact him on Monday and encourage him to come for his treatment so that he can have the best chance to treat his cancer as planned by Dr. Isidore Moos and Dr. Alen Blew.   Harlow Asa RN, BSN, OCN Head & Neck Oncology Nurse [place] at [facility] Phone # [phone]  Fax # [phone]

## 2021-07-01 ENCOUNTER — Ambulatory Visit: Payer: Medicare Other

## 2021-07-02 ENCOUNTER — Ambulatory Visit: Payer: Medicare Other

## 2021-07-02 ENCOUNTER — Inpatient Hospital Stay: Payer: Medicare Other | Admitting: Dietician

## 2021-07-02 NOTE — Progress Notes (Signed)
Oncology Nurse Navigator Documentation   Donald Larson's grandson called me again just now. He spoke with his grandfather today and Donald Larson is not feeling well enough to come in today. He would like to take this week off and start back again on Monday 1/30. They are both aware that his cancer has not been treated as recommended by Dr. Isidore Moos which decreases his chance of a cure. The grandson is very apologetic but tells me that his grandfather is very stubborn and will do what he wants to do. I did advice that Donald Larson go to the Emergency Room if he continues to vomit and isn't able maintain oral intake. Donald Larson and his grandson Donald Larson have my contact number for any further updates or questions.   Harlow Asa RN, BSN, OCN Head & Neck Oncology Nurse [place] at [facility] Phone # [phone]  Fax # [phone]

## 2021-07-03 ENCOUNTER — Ambulatory Visit: Payer: Medicare Other

## 2021-07-04 ENCOUNTER — Ambulatory Visit: Payer: Medicare Other

## 2021-07-05 ENCOUNTER — Ambulatory Visit: Payer: Medicare Other

## 2021-07-08 ENCOUNTER — Ambulatory Visit: Payer: Medicare Other

## 2021-07-08 ENCOUNTER — Ambulatory Visit: Admission: RE | Admit: 2021-07-08 | Payer: Medicare Other | Source: Ambulatory Visit

## 2021-07-08 ENCOUNTER — Other Ambulatory Visit: Payer: Self-pay

## 2021-07-08 ENCOUNTER — Inpatient Hospital Stay: Payer: Medicare Other | Admitting: Nutrition

## 2021-07-08 ENCOUNTER — Encounter: Payer: Self-pay | Admitting: Nutrition

## 2021-07-08 NOTE — Progress Notes (Signed)
Patient did not receive treatment today. He did not come to his nutrition follow up.

## 2021-07-09 ENCOUNTER — Ambulatory Visit
Admission: RE | Admit: 2021-07-09 | Discharge: 2021-07-09 | Disposition: A | Discharge status: Home / Self Care | Payer: Medicare Other | Source: Ambulatory Visit | Attending: Radiation Oncology | Admitting: Radiation Oncology

## 2021-07-09 ENCOUNTER — Ambulatory Visit: Payer: Medicare Other

## 2021-07-09 DIAGNOSIS — C328 Malignant neoplasm of overlapping sites of larynx: Secondary | ICD-10-CM | POA: Diagnosis not present

## 2021-07-09 DIAGNOSIS — C321 Malignant neoplasm of supraglottis: Secondary | ICD-10-CM | POA: Diagnosis not present

## 2021-07-09 DIAGNOSIS — Z79899 Other long term (current) drug therapy: Secondary | ICD-10-CM | POA: Diagnosis not present

## 2021-07-09 DIAGNOSIS — Z5111 Encounter for antineoplastic chemotherapy: Secondary | ICD-10-CM | POA: Diagnosis not present

## 2021-07-09 NOTE — Progress Notes (Signed)
Oncology Nurse Navigator Documentation   I called today and spoke to Sherryl Barters, grandson of Mr. Hamre. I discussed the new plan for his radiation treatments and the newly scheduled appointments for IVF's 3 times weekly. He was appreciative of the information provided and plans to call his grandfather today to discuss the above. He verbalized that he will call me in the morning with an update on Mr. Hudgins's plans for IVF's and radiation treatment.   Harlow Asa RN, BSN, OCN Head & Neck Oncology Nurse [place] at [facility] Phone # [phone]  Fax # [phone]

## 2021-07-10 ENCOUNTER — Ambulatory Visit
Admission: RE | Admit: 2021-07-10 | Discharge: 2021-07-10 | Disposition: A | Discharge status: Home / Self Care | Payer: Medicare Other | Source: Ambulatory Visit | Attending: Radiation Oncology | Admitting: Radiation Oncology

## 2021-07-10 ENCOUNTER — Ambulatory Visit: Payer: Medicare Other

## 2021-07-10 ENCOUNTER — Encounter: Payer: Medicare Other | Admitting: Dietician

## 2021-07-10 ENCOUNTER — Encounter: Payer: Self-pay | Admitting: Oncology

## 2021-07-10 ENCOUNTER — Inpatient Hospital Stay: Payer: Medicare Other

## 2021-07-10 ENCOUNTER — Other Ambulatory Visit: Payer: Self-pay

## 2021-07-10 ENCOUNTER — Encounter: Payer: Self-pay | Admitting: Dietician

## 2021-07-10 DIAGNOSIS — Z95828 Presence of other vascular implants and grafts: Secondary | ICD-10-CM

## 2021-07-10 DIAGNOSIS — Z79899 Other long term (current) drug therapy: Secondary | ICD-10-CM | POA: Insufficient documentation

## 2021-07-10 DIAGNOSIS — C321 Malignant neoplasm of supraglottis: Secondary | ICD-10-CM

## 2021-07-10 DIAGNOSIS — C328 Malignant neoplasm of overlapping sites of larynx: Secondary | ICD-10-CM | POA: Diagnosis not present

## 2021-07-10 MED ORDER — SODIUM CHLORIDE 0.9% FLUSH: 10.0000 mL | Freq: Once | INTRAVENOUS | Status: DC

## 2021-07-10 MED ORDER — HEPARIN SOD (PORK) LOCK FLUSH 100 UNIT/ML IV SOLN: 500.0000 [IU] | Freq: Once | INTRAVENOUS | Status: DC

## 2021-07-10 MED ORDER — SONAFINE EX EMUL
1.0000 "application " | Freq: Two times a day (BID) | CUTANEOUS | Status: DC
  Administered 2021-07-10: 1 via TOPICAL

## 2021-07-10 MED ORDER — SODIUM CHLORIDE 0.9 % IV SOLN: Freq: Once | INTRAVENOUS | Status: AC

## 2021-07-10 NOTE — Patient Instructions (Signed)

## 2021-07-10 NOTE — Progress Notes (Signed)
Patient provided one complimentary case of Ensure Plus High Protein.

## 2021-07-11 ENCOUNTER — Ambulatory Visit: Payer: Medicare Other

## 2021-07-11 ENCOUNTER — Telehealth: Payer: Self-pay | Admitting: Oncology

## 2021-07-11 NOTE — Telephone Encounter (Signed)
Scheduled per los, patient has been called and notified of all upcoming appointments. 

## 2021-07-12 ENCOUNTER — Ambulatory Visit: Payer: Medicare Other

## 2021-07-12 ENCOUNTER — Inpatient Hospital Stay: Payer: Medicare Other | Admitting: Dietician

## 2021-07-12 ENCOUNTER — Inpatient Hospital Stay: Payer: Medicare Other

## 2021-07-12 ENCOUNTER — Telehealth: Payer: Self-pay | Admitting: Oncology

## 2021-07-12 ENCOUNTER — Inpatient Hospital Stay: Payer: Medicare Other | Admitting: Oncology

## 2021-07-12 ENCOUNTER — Other Ambulatory Visit: Payer: Self-pay

## 2021-07-12 NOTE — Telephone Encounter (Signed)
Cancelled all of 02/03 appointments per patient's request, I tried to get in contact with patient multiple times through his work and personal line but he was not answering nor does he have voicemail's set up. Rescheduled per providers request, I spoke with patient's grandson regarding 02/06 appointments. I tried to get in contact with transportation and a nurse navigator on how he's suppose to get here (I was told he uses our transportation services) but I also had no luck reaching anyone. Grandson said he will let him know of his appointments.  [place].

## 2021-07-15 ENCOUNTER — Inpatient Hospital Stay: Payer: Medicare Other

## 2021-07-15 ENCOUNTER — Ambulatory Visit: Payer: Medicare Other

## 2021-07-15 ENCOUNTER — Inpatient Hospital Stay: Payer: Medicare Other | Admitting: Oncology

## 2021-07-15 NOTE — Progress Notes (Signed)
Oncology Nurse Navigator Documentation   I spoke with Donald Larson this morning regarding his cancellation of his appointment with Dr. Alen Blew and radiation treatment on Friday 2/3 and his rescheduled appointments today. He told me that he did not plan to come for his appointments today with Dr. Alen Blew or radiation treatment. I explained that his chance for a cure of his cancer would be best if he came for and completed all his future radiation treatments that are scheduled. He voiced his understanding and tells me that he plans to come tomorrow and complete his treatments. He declined to come today because he is not feeling well. I encouraged him to try to come today to see Dr. Alen Blew for physcial assessment but he continued to decline. I will continue to follow his compliance with his appointments going forward.  Harlow Asa RN, BSN, OCN Head & Neck Oncology Nurse [place] at [facility] Phone # [phone]  Fax # [phone]

## 2021-07-16 ENCOUNTER — Ambulatory Visit
Admission: RE | Admit: 2021-07-16 | Discharge: 2021-07-16 | Disposition: A | Discharge status: Home / Self Care | Payer: Medicare Other | Source: Ambulatory Visit | Attending: Radiation Oncology | Admitting: Radiation Oncology

## 2021-07-16 ENCOUNTER — Other Ambulatory Visit: Payer: Self-pay

## 2021-07-16 ENCOUNTER — Ambulatory Visit: Payer: Medicare Other

## 2021-07-16 DIAGNOSIS — C328 Malignant neoplasm of overlapping sites of larynx: Secondary | ICD-10-CM | POA: Diagnosis not present

## 2021-07-16 DIAGNOSIS — C321 Malignant neoplasm of supraglottis: Secondary | ICD-10-CM | POA: Diagnosis not present

## 2021-07-16 DIAGNOSIS — Z79899 Other long term (current) drug therapy: Secondary | ICD-10-CM | POA: Diagnosis not present

## 2021-07-17 ENCOUNTER — Ambulatory Visit: Payer: Medicare Other

## 2021-07-17 ENCOUNTER — Inpatient Hospital Stay: Payer: Medicare Other

## 2021-07-17 ENCOUNTER — Other Ambulatory Visit: Payer: Self-pay

## 2021-07-17 ENCOUNTER — Inpatient Hospital Stay: Payer: Medicare Other | Admitting: Dietician

## 2021-07-17 ENCOUNTER — Ambulatory Visit
Admission: RE | Admit: 2021-07-17 | Discharge: 2021-07-17 | Disposition: A | Discharge status: Home / Self Care | Payer: Medicare Other | Source: Ambulatory Visit | Attending: Radiation Oncology | Admitting: Radiation Oncology

## 2021-07-17 DIAGNOSIS — C321 Malignant neoplasm of supraglottis: Secondary | ICD-10-CM | POA: Diagnosis not present

## 2021-07-17 DIAGNOSIS — C328 Malignant neoplasm of overlapping sites of larynx: Secondary | ICD-10-CM | POA: Diagnosis not present

## 2021-07-17 DIAGNOSIS — Z79899 Other long term (current) drug therapy: Secondary | ICD-10-CM | POA: Diagnosis not present

## 2021-07-17 NOTE — Progress Notes (Signed)
Nutrition  Scheduled to see patient today infusion. Received message from nurse navigator that patient declined IV hydration today and would be leaving directly after treatment. RD waited outside of treatment area, brief nutrition follow-up completed with patient following radiation treatment. Patient appears weak, walking slowly down the hall using railing for additional support. Offered to get wheelchair for patient, but he declined. Patient reports he is eating very little. Patient drinking "one maybe two" Ensure daily. RD encouraged patient to reconsider staying to receive IV fluids. Patient reports he "doesn't feel like it today, but maybe tomorrow or the next day. " Ensure case provided to patient on 2/01 remained at registration desk. RD reminded him of this. Patient appreciative and took this home today. Patient currently scheduled to receive IV fluids on Friday 2/10. Will plan to see patient during infusion for follow-up.

## 2021-07-18 ENCOUNTER — Other Ambulatory Visit: Payer: Self-pay

## 2021-07-18 ENCOUNTER — Ambulatory Visit
Admission: RE | Admit: 2021-07-18 | Discharge: 2021-07-18 | Disposition: A | Discharge status: Home / Self Care | Payer: Medicare Other | Source: Ambulatory Visit | Attending: Radiation Oncology | Admitting: Radiation Oncology

## 2021-07-18 ENCOUNTER — Ambulatory Visit: Payer: Medicare Other

## 2021-07-18 DIAGNOSIS — C321 Malignant neoplasm of supraglottis: Secondary | ICD-10-CM | POA: Diagnosis not present

## 2021-07-18 DIAGNOSIS — Z79899 Other long term (current) drug therapy: Secondary | ICD-10-CM | POA: Diagnosis not present

## 2021-07-18 DIAGNOSIS — C328 Malignant neoplasm of overlapping sites of larynx: Secondary | ICD-10-CM | POA: Diagnosis not present

## 2021-07-19 ENCOUNTER — Inpatient Hospital Stay: Payer: Medicare Other | Admitting: Dietician

## 2021-07-19 ENCOUNTER — Ambulatory Visit: Payer: Medicare Other

## 2021-07-19 ENCOUNTER — Inpatient Hospital Stay: Payer: Medicare Other

## 2021-07-22 ENCOUNTER — Ambulatory Visit: Payer: Medicare Other

## 2021-07-22 ENCOUNTER — Inpatient Hospital Stay: Payer: Medicare Other

## 2021-07-23 ENCOUNTER — Ambulatory Visit: Payer: Medicare Other

## 2021-07-23 ENCOUNTER — Other Ambulatory Visit: Payer: Self-pay

## 2021-07-23 ENCOUNTER — Ambulatory Visit
Admission: RE | Admit: 2021-07-23 | Discharge: 2021-07-23 | Disposition: A | Discharge status: Home / Self Care | Payer: Medicare Other | Source: Ambulatory Visit | Attending: Radiation Oncology | Admitting: Radiation Oncology

## 2021-07-23 ENCOUNTER — Encounter: Payer: Self-pay | Admitting: Radiation Oncology

## 2021-07-23 DIAGNOSIS — C321 Malignant neoplasm of supraglottis: Secondary | ICD-10-CM | POA: Diagnosis not present

## 2021-07-23 DIAGNOSIS — Z79899 Other long term (current) drug therapy: Secondary | ICD-10-CM | POA: Diagnosis not present

## 2021-07-23 DIAGNOSIS — C328 Malignant neoplasm of overlapping sites of larynx: Secondary | ICD-10-CM | POA: Diagnosis not present

## 2021-07-24 ENCOUNTER — Ambulatory Visit: Payer: Medicare Other

## 2021-07-24 ENCOUNTER — Inpatient Hospital Stay: Payer: Medicare Other

## 2021-07-24 ENCOUNTER — Inpatient Hospital Stay: Payer: Medicare Other | Admitting: Dietician

## 2021-07-25 ENCOUNTER — Ambulatory Visit: Payer: Medicare Other

## 2021-07-26 ENCOUNTER — Inpatient Hospital Stay: Payer: Medicare Other

## 2021-07-26 ENCOUNTER — Ambulatory Visit: Payer: Medicare Other

## 2021-07-29 ENCOUNTER — Ambulatory Visit: Payer: Medicare Other

## 2021-07-31 ENCOUNTER — Ambulatory Visit: Payer: Medicare Other

## 2021-08-02 ENCOUNTER — Ambulatory Visit: Payer: Medicare Other

## 2021-08-02 ENCOUNTER — Ambulatory Visit: Payer: Medicare Other | Admitting: Radiation Oncology

## 2021-08-02 ENCOUNTER — Other Ambulatory Visit: Payer: Self-pay

## 2021-08-02 DIAGNOSIS — C321 Malignant neoplasm of supraglottis: Secondary | ICD-10-CM

## 2021-08-02 NOTE — Progress Notes (Signed)
pet

## 2021-08-02 NOTE — Progress Notes (Signed)
Oncology Nurse Navigator Documentation   I tried to call Mr. Eden about his follow up appointment today but he did not answer so I then called his grandson to see if he would be coming today. He had spoken with his grandfather and told me that he is not willing to come today. He plans to keep in touch with me as to when/if he may be willing to come in to be seen. I have notified Dr. Isidore Moos and Althia Forts RN of the above conversation.  Harlow Asa RN, BSN, OCN Head & Neck Oncology Nurse [place] at [facility] Phone # [phone]  Fax # [phone]

## 2021-08-05 ENCOUNTER — Ambulatory Visit: Payer: Medicare Other

## 2021-08-06 ENCOUNTER — Ambulatory Visit: Payer: Self-pay | Admitting: Radiation Oncology

## 2021-08-06 ENCOUNTER — Encounter: Payer: Self-pay | Admitting: Oncology

## 2021-08-07 ENCOUNTER — Ambulatory Visit: Payer: Medicare Other

## 2021-08-09 ENCOUNTER — Ambulatory Visit: Payer: Medicare Other

## 2021-08-12 ENCOUNTER — Ambulatory Visit: Payer: Medicare Other

## 2021-08-13 ENCOUNTER — Telehealth (HOSPITAL_COMMUNITY): Payer: Self-pay

## 2021-08-13 NOTE — Telephone Encounter (Signed)
I called patient to inform him of Dr. Benson Norway taking a Medical Leave of Absence from 08-12-21 thru 11-04-21 and the need to cancel or reschedule his appt on 09-06-21 with Dental Medicine. Patient stated that he didn't even know this appt was scheduled and wanted to cancel it. I explained that if he had any problems he could give [place] a call and we would get him in to see [place] upon Dr. Raynelle Dick return. Patient voiced understanding and his appt for 09-06-21 was cancelled. ?

## 2021-08-14 ENCOUNTER — Ambulatory Visit: Payer: Medicare Other

## 2021-08-16 ENCOUNTER — Ambulatory Visit: Payer: Medicare Other

## 2021-09-06 ENCOUNTER — Other Ambulatory Visit (HOSPITAL_COMMUNITY): Payer: Medicare Other | Admitting: Dentistry

## 2021-10-01 ENCOUNTER — Telehealth: Payer: Self-pay | Admitting: *Deleted

## 2021-10-01 NOTE — Telephone Encounter (Signed)
CALLED PATIENT'S GRANDSON - [name] DOSS TO INFORM OF PET SCAN FOR 10-21-21 @ [facility], PATIENT TO HAVE WATER ONLY- 6 HRS. PRIOR TO TEST, PATIENT TO RECEIVE RESULTS FROM DR. SQUIRE ON 10-22-21 @ 2 PM, LVM FOR A RETURN CALL ?

## 2021-10-16 ENCOUNTER — Other Ambulatory Visit: Payer: Self-pay | Admitting: Oncology

## 2021-10-16 DIAGNOSIS — C321 Malignant neoplasm of supraglottis: Secondary | ICD-10-CM

## 2021-10-21 ENCOUNTER — Encounter (HOSPITAL_COMMUNITY)
Admission: RE | Admit: 2021-10-21 | Discharge: 2021-10-21 | Disposition: A | Discharge status: Home / Self Care | Payer: Medicare Other | Source: Ambulatory Visit | Attending: Radiation Oncology | Admitting: Radiation Oncology

## 2021-10-21 DIAGNOSIS — C321 Malignant neoplasm of supraglottis: Secondary | ICD-10-CM | POA: Diagnosis not present

## 2021-10-21 DIAGNOSIS — C760 Malignant neoplasm of head, face and neck: Secondary | ICD-10-CM | POA: Diagnosis not present

## 2021-10-21 DIAGNOSIS — I700 Atherosclerosis of aorta: Secondary | ICD-10-CM | POA: Diagnosis not present

## 2021-10-21 DIAGNOSIS — J432 Centrilobular emphysema: Secondary | ICD-10-CM | POA: Diagnosis not present

## 2021-10-21 DIAGNOSIS — I2510 Atherosclerotic heart disease of native coronary artery without angina pectoris: Secondary | ICD-10-CM | POA: Diagnosis not present

## 2021-10-21 LAB — GLUCOSE, CAPILLARY: Glucose-Capillary: 106 mg/dL — ABNORMAL HIGH (ref 70–99)

## 2021-10-21 MED ORDER — FLUDEOXYGLUCOSE F - 18 (FDG) INJECTION
6.0000 | Freq: Once | INTRAVENOUS | Status: AC | PRN
  Administered 2021-10-21: 5.93 via INTRAVENOUS

## 2021-10-22 ENCOUNTER — Ambulatory Visit
Admission: RE | Admit: 2021-10-22 | Discharge: 2021-10-22 | Disposition: A | Discharge status: Home / Self Care | Payer: Medicare Other | Source: Ambulatory Visit | Attending: Radiation Oncology | Admitting: Radiation Oncology

## 2021-10-22 ENCOUNTER — Other Ambulatory Visit: Payer: Self-pay

## 2021-10-22 ENCOUNTER — Encounter: Payer: Self-pay | Admitting: Radiation Oncology

## 2021-10-22 VITALS — BP 119/74 | HR 85 | Temp 98.3°F | Resp 15 | Wt 120.4 lb

## 2021-10-22 DIAGNOSIS — F17210 Nicotine dependence, cigarettes, uncomplicated: Secondary | ICD-10-CM | POA: Diagnosis not present

## 2021-10-22 DIAGNOSIS — I700 Atherosclerosis of aorta: Secondary | ICD-10-CM | POA: Diagnosis not present

## 2021-10-22 DIAGNOSIS — R911 Solitary pulmonary nodule: Secondary | ICD-10-CM | POA: Diagnosis not present

## 2021-10-22 DIAGNOSIS — I2510 Atherosclerotic heart disease of native coronary artery without angina pectoris: Secondary | ICD-10-CM | POA: Insufficient documentation

## 2021-10-22 DIAGNOSIS — C328 Malignant neoplasm of overlapping sites of larynx: Secondary | ICD-10-CM | POA: Diagnosis present

## 2021-10-22 DIAGNOSIS — C321 Malignant neoplasm of supraglottis: Secondary | ICD-10-CM

## 2021-10-22 DIAGNOSIS — Z8521 Personal history of malignant neoplasm of larynx: Secondary | ICD-10-CM | POA: Insufficient documentation

## 2021-10-22 DIAGNOSIS — J439 Emphysema, unspecified: Secondary | ICD-10-CM | POA: Diagnosis not present

## 2021-10-22 DIAGNOSIS — Z85818 Personal history of malignant neoplasm of other sites of lip, oral cavity, and pharynx: Secondary | ICD-10-CM | POA: Diagnosis present

## 2021-10-22 DIAGNOSIS — Z923 Personal history of irradiation: Secondary | ICD-10-CM | POA: Insufficient documentation

## 2021-10-22 DIAGNOSIS — I6523 Occlusion and stenosis of bilateral carotid arteries: Secondary | ICD-10-CM | POA: Insufficient documentation

## 2021-10-22 DIAGNOSIS — Z79899 Other long term (current) drug therapy: Secondary | ICD-10-CM | POA: Insufficient documentation

## 2021-10-22 NOTE — Progress Notes (Signed)
Mr. Mcgurn presents today after completing radiation to hi larynx on 07/23/2021, and to review PET scan results from 10/21/2021 ? ?Pain issues, if any: Patient denies any mouth or throat pain ?Using a feeding tube?: N/A ?Weight changes, if any:  ?Wt Readings from Last 3 Encounters:  ?10/22/21 120 lb 6 oz (54.6 kg)  ?06/26/21 119 lb (54 kg)  ?06/19/21 126 lb 1 oz (57.2 kg)  ? ?Swallowing issues, if any: Patient denies--reports he can eat/drink a varied diet once his appetite is stimulated. Supplementing with Ensure daily as well ?Smoking or chewing tobacco? Continues to smoke days ?Using fluoride trays daily? N/A ?Last ENT visit was on: Not since diagnosis ?Other notable issues, if any: Continues to deal with dry mouth and thick saliva. Skin appears well healed in treatment area. Reports fatigue is slowly improving. Does not remember if he was supposed to have F/U with his medical oncologist Dr. Zola Button.  ? ? ? ? ?

## 2021-10-22 NOTE — Progress Notes (Signed)
?Radiation Oncology         [phone] ?________________________________ ? ?Name: Donald Larson MRN: [id number]  ?Date: 10/22/2021  DOB: 04/05/1946 ? ?Follow-Up Visit Note ? ?CC: Donald Junior, MD  [name], MD ? ?Diagnosis and Prior Radiotherapy:     ?  ICD-10-CM   ?1. 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  ?  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 ? ?CHIEF COMPLAINT:  Here for follow-up and surveillance of throat cancer ? ?Narrative:  The patient returns today for routine follow-up.     Donald Larson presents today after completing radiation to hi larynx on 07/23/2021, and to review PET scan results from 10/21/2021 ? ?Pain issues, if any: Patient denies any mouth or throat pain ?Using a feeding tube?: N/A ?Weight changes, if any:  ?Wt Readings from Last 3 Encounters:  ?10/22/21 120 lb 6 oz (54.6 kg)  ?06/26/21 119 lb (54 kg)  ?06/19/21 126 lb 1 oz (57.2 kg)  ? ?Swallowing issues, if any: Patient denies--reports he can eat/drink a varied diet once his appetite is stimulated. Supplementing with Ensure daily as well ?Smoking or chewing tobacco? Continues to smoke days ?Using fluoride trays daily? N/A ?Last ENT visit was on: Not since diagnosis ?Other notable issues, if any: Continues to deal with dry mouth and thick saliva. Skin appears well healed in treatment area. Reports fatigue is slowly improving. Does not remember if he was supposed to have F/U with his medical oncologist Dr. Zola Button.  ? ?                ? ?ALLERGIES:  has No Known Allergies. ? ?Meds: ?Current Outpatient Medications  ?Medication Sig Dispense Refill  ? amLODipine (NORVASC) 10 MG tablet Take 10 mg by mouth daily.    ? Aspirin-Caffeine (BC FAST PAIN RELIEF PO) Take 2 packets by mouth daily as needed (pain).    ?  hydrochlorothiazide (HYDRODIURIL) 12.5 MG tablet Take 12.5 mg by mouth daily as needed (swelling).    ? lidocaine (XYLOCAINE) 2 % solution Patient: Mix 1part 2% viscous lidocaine, 1part H20. Swish & swallow 20m of diluted mixture, 319m before meals and at bedtime, up to QID for soreness. 200 mL 3  ? omeprazole (PRILOSEC OTC) 20 MG tablet Take 20 mg by mouth See admin instructions. Take 20 mg daily, may take a second 20 mg dose as needed for acid reflux (Patient not taking: Reported on 06/19/2021)    ? Oxycodone HCl 10 MG TABS Take 10 mg by mouth 2 (two) times daily.    ? prochlorperazine (COMPAZINE) 10 MG tablet TAKE 1 TABLET(10 MG) BY MOUTH EVERY 6 HOURS AS NEEDED FOR NAUSEA OR VOMITING 30 tablet 0  ? traZODone (DESYREL) 50 MG tablet Take 1 tablet by mouth at bedtime as needed.    ? ?No current facility-administered medications for this encounter.  ? ? ?Physical Findings: ?The patient is in no acute distress. Patient is alert and oriented. ?Wt Readings from Last 3 Encounters:  ?10/22/21 120 lb 6 oz (54.6 kg)  ?06/26/21 119 lb (54 kg)  ?06/19/21 126 lb 1 oz (57.2 kg)  ? ? weight is 120 lb 6 oz (54.6 kg). His temperature is 98.3 ?F (36.8 ?C). His blood pressure is 119/74 and his pulse is 85. His respiration is  15 and oxygen saturation is 98%. Marland Kitchen  ?General: Alert and oriented, in no acute distress ?HEENT: Head is normocephalic. Extraocular movements are intact. Oropharynx is notable for no lesions ?Neck: Neck is notable for no masses ?Skin: Skin in treatment fields shows satisfactory healing  ?Heart: Regular in rate and rhythm with no murmurs, rubs, or gallops. ?Chest: Clear to auscultation bilaterally, with no rhonchi, wheezes, or rales. ?Abdomen: Soft, nontender, nondistended, with no rigidity or guarding. ?Extremities: No cyanosis or edema. ?Lymphatics: see Neck Exam ?Psychiatric: Judgment and insight are intact. Affect is appropriate. ? ? ?Lab Findings: ?Lab Results  ?Component Value Date  ? WBC 4.2 06/26/2021  ?  HGB 12.1 (L) 06/26/2021  ? HCT 34.9 (L) 06/26/2021  ? MCV 93.8 06/26/2021  ? PLT 128 (L) 06/26/2021  ? ? ?Lab Results  ?Component Value Date  ? TSH 2.947 05/08/2021  ? ? ?Radiographic Findings: ?NM PET Image Restag (PS) Skull Base To Thigh ? ?Result Date: 10/22/2021 ?CLINICAL DATA:  Subsequent treatment strategy for restaging of head neck cancer. Diagnosed 9/22. Radiation therapy and chemotherapy complete. EXAM: NUCLEAR MEDICINE PET SKULL BASE TO THIGH TECHNIQUE: 5.9 mCi F-18 FDG was injected intravenously. Full-ring PET imaging was performed from the skull base to thigh after the radiotracer. CT data was obtained and used for attenuation correction and anatomic localization. Fasting blood glucose: 106 mg/dl COMPARISON:  04/29/2021 FINDINGS: Mediastinal blood pool activity: SUV max 1.9 Liver activity: SUV max NA NECK: The right epiglottic hypermetabolic mass is no longer identified. There is residual soft tissue thickening in this area which could be treatment related including on 43/4. The right level 2 node is difficult to delineate on CT images. The hypermetabolism in this area is markedly decreased, including at a S.U.V. max of 2.5 on 36/4. Compare a S.U.V. max of 10.9 on the prior exam. No new cervical nodal hypermetabolism. Incidental CT findings: Bilateral carotid atherosclerosis. CHEST: Minimal right upper lobe nodularity corresponding to low-level activity at a S.U.V. max of 1.1 on 67/4, likely new. The posterior left upper lobe pulmonary nodule detailed on the prior exam measures 7 mm and a S.U.V. max of 1.7 on 78/4. Similar in size and a S.U.V. max of 1.1 on the prior exam. Right lower lobe endobronchial hypermetabolic focus measures 7 mm and a S.U.V. max of 5.0 on 102/4, favored to represent mucoid impaction. Incidental CT findings: Centrilobular emphysema. Right greater than left lower lobe and lingular peribronchovascular nodularity, new. Example 109/4. left Port-A-Cath tip high right atrium. Aortic and  coronary artery calcification. ABDOMEN/PELVIS: No abdominopelvic parenchymal or nodal hypermetabolism. Incidental CT findings: Normal noncontrast appearance of the liver, stomach, pancreas, gallbladder, adrenal glands, left kidney. Subcentimeter interpolar right renal low-density lesion is likely a cyst. Abdominal aortic atherosclerosis. Normal prostate. No bowel obstruction. Prominent abdominal retroperitoneal nodes are similar. Right inguinal node of 1.3 cm on 185/4 is unchanged and not hypermetabolic. SKELETON: No abnormal marrow activity. Incidental CT findings: none IMPRESSION: 1. Response to therapy of right sided epiglottic primary and level 2A nodal metastasis, as detailed above. 2. Development of peribronchovascular nodularity throughout both lungs, suspicious for infectious bronchiolitis versus (given the history of head neck primary) aspiration. 3. The left upper lobe pulmonary nodule is similar in size and demonstrates nonspecific slightly increased hypermetabolism. Recommend attention on follow-up. 4. Right lower lobe endobronchial nodular focus with hypermetabolism, likely an area of mucoid impaction. 5. Possible tiny right upper lobe pulmonary nodule with low-level hypermetabolism, new. Recommend attention on follow-up. 6. No findings of distant metastatic  disease. 7. Aortic atherosclerosis (ICD10-I70.0), coronary artery atherosclerosis and emphysema (ICD10-J43.9). Electronically Signed   By: Abigail Miyamoto M.D.   On: 10/22/2021 09:30   ? ?Impression/Plan:   ? ?1) Head and Neck Cancer Status: No evidence of disease.  I personally reviewed his PET with him. ? ?2) Nutritional Status: Weight is stabilizing, nutritionist has given him coupons for nutritional shakes and additional samples ?PEG tube: None ? ?3) Risk Factors: The patient has again been educated about risk factors including alcohol and tobacco abuse; they understand that avoidance of alcohol and tobacco is important to prevent recurrences as  well as other cancers.  He is still smoking and does not seem motivated to quit fully but has cut back from 3 packs a day to 1 pack a day ? ?4) Swallowing: He denies any choking or aspiration to his knowledge.  He

## 2021-10-22 NOTE — Progress Notes (Signed)
Oncology Nurse Navigator Documentation  ? ?I met with Donald Larson before and during his follow up appointment with Dr. Isidore Moos today. He received results from his recent PET scan. He reports a good appetite especially if he is able to drink ensure plus. He knows that I will arrange future appointments with his ENT MD, Dr. Alen Blew, and Dr. Isidore Moos and call him with those appointments. He knows that he can call me anytime he has any needs. ? ?Donald Asa RN, BSN, OCN ?Head & Neck Oncology Nurse Navigator ?[place] at [facility] ?Phone # [phone]  ?Fax # [phone]  ?

## 2021-10-23 ENCOUNTER — Other Ambulatory Visit: Payer: Self-pay

## 2021-10-23 ENCOUNTER — Encounter: Payer: Self-pay | Admitting: Radiation Oncology

## 2021-10-23 DIAGNOSIS — C321 Malignant neoplasm of supraglottis: Secondary | ICD-10-CM

## 2021-10-31 ENCOUNTER — Other Ambulatory Visit (HOSPITAL_COMMUNITY): Payer: Medicare Other

## 2021-11-15 ENCOUNTER — Encounter: Payer: Self-pay | Admitting: Oncology

## 2021-11-15 NOTE — Progress Notes (Signed)
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  He received 46 Gray in 23 fractions.  He then expressed reluctance to complete treatment.  He did not show for appointments as scheduled. Therefore upon further discussion with Dr. Isidore Moos, he agreed to continue treatment at a hypofractionated technique so that he could come in for less visits.  He tolerated an additional 6 fractions at 2.7 Gray per fraction to yield 16.2 [place] Cables more.   Total dose received was 62.2 Gray in 29 fractions.  He refused further treatment after this point in time.  His treatment elapsed over 2 months with breaks that were against medical advice.   Site: Supraglottis and bilateral neck nodes   Beams/energy:   Helical IMRT / 6 MV photons    Technique: IMRT   Beam Energy: 6MV   Narrative: See above   Plan: The patient has completed radiation treatment. The patient will return to radiation oncology clinic for routine follow-up in 3 weeks. I advised them to call or return sooner if they have any questions or concerns related to their recovery or treatment. -----------------------------------  Eppie Gibson, MD

## 2021-11-15 NOTE — Progress Notes (Deleted)
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  He received 46 Gray in 23 fractions.  He then expressed reluctance to complete treatment.  He did not show for appointments as scheduled. Therefore upon further discussion with Dr. Isidore Moos, he agreed to continue treatment at a hypofractionated technique so that he could come in for less visits.  He tolerated an additional 6 fractions at 2.7 Gray per fraction to yield 16.2 [place] Cables more.  Total dose received was 62.2 Gray in 29 fractions.  He refused further treatment after this point in time.  His treatment elapsed over 2 months with breaks that were against medical advice.  Site: Supraglottis and bilateral neck nodes  Beams/energy:   Helical IMRT / 6 MV photons   Technique: IMRT  Beam Energy: 6MV  Narrative: See above  Plan: The patient has completed radiation treatment. The patient will return to radiation oncology clinic for routine follow-up in 3 weeks. I advised them to call or return sooner if they have any questions or concerns related to their recovery or treatment. -----------------------------------  Eppie Gibson, MD

## 2021-11-19 ENCOUNTER — Ambulatory Visit (HOSPITAL_COMMUNITY)
Admission: RE | Admit: 2021-11-19 | Discharge: 2021-11-19 | Disposition: A | Discharge status: Home / Self Care | Payer: Medicare Other | Source: Ambulatory Visit | Attending: Oncology | Admitting: Oncology

## 2021-11-19 DIAGNOSIS — Z8521 Personal history of malignant neoplasm of larynx: Secondary | ICD-10-CM | POA: Diagnosis not present

## 2021-11-19 DIAGNOSIS — C321 Malignant neoplasm of supraglottis: Secondary | ICD-10-CM | POA: Insufficient documentation

## 2021-11-19 DIAGNOSIS — Z452 Encounter for adjustment and management of vascular access device: Secondary | ICD-10-CM | POA: Insufficient documentation

## 2021-11-19 HISTORY — PX: IR REMOVAL TUN ACCESS W/ PORT W/O FL MOD SED: IMG2290

## 2021-11-19 MED ORDER — LIDOCAINE HCL 1 % IJ SOLN
INTRAMUSCULAR | Status: AC
  Filled 2021-11-19: qty 20

## 2021-11-19 MED ORDER — LIDOCAINE HCL 1 % IJ SOLN
INTRAMUSCULAR | Status: DC | PRN
  Administered 2021-11-19: 10 mL

## 2021-12-16 DIAGNOSIS — F17210 Nicotine dependence, cigarettes, uncomplicated: Secondary | ICD-10-CM | POA: Diagnosis not present

## 2021-12-16 DIAGNOSIS — Z8521 Personal history of malignant neoplasm of larynx: Secondary | ICD-10-CM | POA: Diagnosis not present

## 2021-12-16 DIAGNOSIS — Z9221 Personal history of antineoplastic chemotherapy: Secondary | ICD-10-CM | POA: Diagnosis not present

## 2021-12-16 DIAGNOSIS — Z923 Personal history of irradiation: Secondary | ICD-10-CM | POA: Diagnosis not present

## 2021-12-24 ENCOUNTER — Encounter: Payer: Self-pay | Admitting: Oncology

## 2022-01-01 ENCOUNTER — Telehealth: Payer: Self-pay | Admitting: Oncology

## 2022-01-01 NOTE — Telephone Encounter (Signed)
.  Called patient to schedule appointment per 5/17 inbasket, patient is aware of date and time.   ?

## 2022-02-12 ENCOUNTER — Encounter: Payer: Self-pay | Admitting: Oncology

## 2022-02-18 ENCOUNTER — Inpatient Hospital Stay: Payer: Medicare Other | Attending: Oncology | Admitting: Oncology

## 2022-02-19 ENCOUNTER — Telehealth: Payer: Self-pay | Admitting: Oncology

## 2022-02-19 NOTE — Telephone Encounter (Signed)
R/s per 9/12 in basket, called 2x no answer, will call again later

## 2022-02-20 ENCOUNTER — Inpatient Hospital Stay: Payer: Medicare Other | Admitting: Oncology

## 2022-02-20 ENCOUNTER — Telehealth: Payer: Self-pay | Admitting: *Deleted

## 2022-02-20 NOTE — Telephone Encounter (Signed)
PC to patient regarding missed appointment today, patient states he was unaware of his appointment, everything has to be scheduled through his grandson Donald Larson, [phone].  Informed patient scheduling will reach out to his grandson to reschedule his appointment, he verbalizes understanding.  Scheduling message sent.

## 2022-03-12 DIAGNOSIS — Z23 Encounter for immunization: Secondary | ICD-10-CM | POA: Diagnosis not present

## 2022-04-01 ENCOUNTER — Inpatient Hospital Stay: Payer: Medicare Other | Attending: Oncology | Admitting: Oncology

## 2022-04-01 ENCOUNTER — Inpatient Hospital Stay: Payer: Medicare Other

## 2022-04-01 ENCOUNTER — Other Ambulatory Visit: Payer: Self-pay

## 2022-04-01 VITALS — BP 150/71 | HR 67 | Temp 97.9°F | Resp 17 | Ht 67.0 in | Wt 127.6 lb

## 2022-04-01 DIAGNOSIS — C321 Malignant neoplasm of supraglottis: Secondary | ICD-10-CM | POA: Diagnosis not present

## 2022-04-01 DIAGNOSIS — Z9221 Personal history of antineoplastic chemotherapy: Secondary | ICD-10-CM | POA: Diagnosis not present

## 2022-04-01 DIAGNOSIS — Z85831 Personal history of malignant neoplasm of soft tissue: Secondary | ICD-10-CM | POA: Insufficient documentation

## 2022-04-01 DIAGNOSIS — Z923 Personal history of irradiation: Secondary | ICD-10-CM | POA: Diagnosis not present

## 2022-04-01 NOTE — Progress Notes (Signed)
Hematology and Oncology Follow Up Visit  Donald Larson [id number] Oct 07, 1945 76 y.o. 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     Prior Therapy: He is status post directed endoscopy at that time revealed a soft tissue mass in the right aryepiglottic fold and neck mass measuring 2 cm on examination.  Biopsy at that time revealed a moderately differentiated invasive squamous cell carcinoma with fine-needle aspiration of the right supraglottic neck mass revealed malignant squamous cell carcinoma.    Radiation therapy and weekly cisplatin started on May 22, 2021.  Therapy concluded in January 2023.   Current therapy: Active surveillance.  Interim History: Donald Larson returns today for a follow-up visit.  He has completed the definitive treatment for his tumor and achieved a complete response.  PET scan obtained in May 2023 did not show any evidence of disease relapse.  He has follow-up with Dr. Isidore Larson regarding this issue.  He denies any nausea, vomiting or any residual toxicity related to cisplatin.     Medications: Updated on review. Current Outpatient Medications  Medication Sig Dispense Refill   amLODipine (NORVASC) 10 MG tablet Take 10 mg by mouth daily.     Aspirin-Caffeine (BC FAST PAIN RELIEF PO) Take 2 packets by mouth daily as needed (pain).     hydrochlorothiazide (HYDRODIURIL) 12.5 MG tablet Take 12.5 mg by mouth daily as needed (swelling).     lidocaine (XYLOCAINE) 2 % solution Patient: Mix 1part 2% viscous lidocaine, 1part H20. Swish & swallow 65m of diluted mixture, 399m before meals and at bedtime, up to QID for soreness. 200 mL 3   omeprazole (PRILOSEC OTC) 20 MG tablet Take 20 mg by mouth See admin instructions. Take 20 mg daily, may take a second 20 mg dose as needed for acid reflux (Patient not taking: Reported on  06/19/2021)     Oxycodone HCl 10 MG TABS Take 10 mg by mouth 2 (two) times daily.     prochlorperazine (COMPAZINE) 10 MG tablet TAKE 1 TABLET(10 MG) BY MOUTH EVERY 6 HOURS AS NEEDED FOR NAUSEA OR VOMITING 30 tablet 0   traZODone (DESYREL) 50 MG tablet Take 1 tablet by mouth at bedtime as needed.     No current facility-administered medications for this visit.     Allergies: No Known Allergies   Physical Exam:  Blood pressure (!) 150/71, pulse 67, temperature 97.9 F (36.6 C), temperature source Temporal, resp. rate 17, height '5\' 7"'$  (1.702 m), weight 127 lb 9.6 oz (57.9 kg), SpO2 100 %.   ECOG: 1    General appearance: Comfortable appearing without any discomfort Head: Normocephalic without any trauma Oropharynx: Mucous membranes are moist and pink without any thrush or ulcers. Eyes: Pupils are equal and round reactive to light. Lymph nodes: No cervical, supraclavicular, inguinal or axillary lymphadenopathy.   Heart:regular rate and rhythm.  S1 and S2 without leg edema. Lung: Clear without any rhonchi or wheezes.  No dullness to percussion. Abdomin: Soft, nontender, nondistended with good bowel sounds.  No hepatosplenomegaly. Musculoskeletal: No joint deformity or effusion.  Full range of motion noted. Neurological: No deficits noted on motor, sensory and deep tendon reflex exam. Skin: No petechial rash or dryness.  Appeared moist.       Lab Results: Lab Results  Component Value Date   WBC 4.2 06/26/2021   HGB 12.1 (L) 06/26/2021   HCT 34.9 (L) 06/26/2021  MCV 93.8 06/26/2021   PLT 128 (L) 06/26/2021     Chemistry      Component Value Date/Time   NA 139 06/26/2021 0803   K 3.9 06/26/2021 0803   CL 101 06/26/2021 0803   CO2 25 06/26/2021 0803   BUN 18 06/26/2021 0803   CREATININE 1.22 06/26/2021 0803   CREATININE 0.81 11/23/2014 1504      Component Value Date/Time   CALCIUM 9.0 06/26/2021 0803   ALKPHOS 67 06/26/2021 0803   AST 22 06/26/2021 0803   ALT 13  06/26/2021 0803   BILITOT 0.6 06/26/2021 0803        Impression and Plan:   76 year old with:  1. 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   He is currently on active surveillance after achieving a complete response to therapy.  PET scan obtained in May 2023 showed no evidence of relapsed disease.  I recommended continued surveillance at this time and intervene only if he developed relapsed disease.       2.  IV access: Port-A-Cath has been removed without any issues.   3.  Nutritional status: He is eating adequately and has gained weight at this time.   4.  Follow-up: will be as needed in the future.  He is following with Dr. Isidore Larson regarding his cancer.   30  minutes were spent on the this encounter.  The time was dedicated to reviewing laboratory data, disease status update and outlining future plan of care discussion.          Zola Button, MD 10/24/20238:39 AM

## 2022-04-23 NOTE — Progress Notes (Signed)
Mr. Deady is here today for follow up of Larynx cancer. He completed radiation therapy on 07-23-21.     Pain issues, if any: chronic back pain Using a feeding tube?: no Weight changes, if any: gaining weight now Wt Readings from Last 3 Encounters:  04/29/22 127 lb (57.6 kg)  04/01/22 127 lb 9.6 oz (57.9 kg)  10/22/21 120 lb 6 oz (54.6 kg)    Swallowing issues, if any: still cough with swallowing, getting good nutrition Smoking or chewing tobacco? yes Using fluoride trays daily? no Last ENT visit was on: ENT in July Other notable issues, if any: none at this time  Vitals:   04/29/22 1402  BP: 129/77  Pulse: 67  Resp: 18  Temp: (!) 97.5 F (36.4 C)  SpO2: 99%

## 2022-04-25 ENCOUNTER — Ambulatory Visit (HOSPITAL_COMMUNITY)
Admission: RE | Admit: 2022-04-25 | Discharge: 2022-04-25 | Disposition: A | Discharge status: Home / Self Care | Payer: Medicare Other | Source: Ambulatory Visit | Attending: Radiation Oncology | Admitting: Radiation Oncology

## 2022-04-25 ENCOUNTER — Inpatient Hospital Stay: Payer: Medicare Other | Attending: Oncology

## 2022-04-25 DIAGNOSIS — R918 Other nonspecific abnormal finding of lung field: Secondary | ICD-10-CM | POA: Diagnosis not present

## 2022-04-25 DIAGNOSIS — C760 Malignant neoplasm of head, face and neck: Secondary | ICD-10-CM | POA: Diagnosis not present

## 2022-04-25 DIAGNOSIS — C321 Malignant neoplasm of supraglottis: Secondary | ICD-10-CM | POA: Diagnosis not present

## 2022-04-29 ENCOUNTER — Ambulatory Visit
Admission: RE | Admit: 2022-04-29 | Discharge: 2022-04-29 | Disposition: A | Discharge status: Home / Self Care | Payer: Medicare Other | Source: Ambulatory Visit | Attending: Radiation Oncology | Admitting: Radiation Oncology

## 2022-04-29 ENCOUNTER — Inpatient Hospital Stay: Payer: Medicare Other

## 2022-04-29 ENCOUNTER — Encounter: Payer: Self-pay | Admitting: Radiation Oncology

## 2022-04-29 VITALS — BP 129/77 | HR 67 | Temp 97.5°F | Resp 18 | Wt 127.0 lb

## 2022-04-29 DIAGNOSIS — F17210 Nicotine dependence, cigarettes, uncomplicated: Secondary | ICD-10-CM | POA: Insufficient documentation

## 2022-04-29 DIAGNOSIS — Z79899 Other long term (current) drug therapy: Secondary | ICD-10-CM | POA: Insufficient documentation

## 2022-04-29 DIAGNOSIS — C321 Malignant neoplasm of supraglottis: Secondary | ICD-10-CM

## 2022-04-29 DIAGNOSIS — C329 Malignant neoplasm of larynx, unspecified: Secondary | ICD-10-CM

## 2022-04-29 DIAGNOSIS — Z8521 Personal history of malignant neoplasm of larynx: Secondary | ICD-10-CM | POA: Insufficient documentation

## 2022-04-29 DIAGNOSIS — R590 Localized enlarged lymph nodes: Secondary | ICD-10-CM | POA: Insufficient documentation

## 2022-04-29 DIAGNOSIS — F17200 Nicotine dependence, unspecified, uncomplicated: Secondary | ICD-10-CM | POA: Diagnosis not present

## 2022-04-29 DIAGNOSIS — Z1329 Encounter for screening for other suspected endocrine disorder: Secondary | ICD-10-CM

## 2022-04-29 DIAGNOSIS — R911 Solitary pulmonary nodule: Secondary | ICD-10-CM | POA: Insufficient documentation

## 2022-04-29 DIAGNOSIS — Z923 Personal history of irradiation: Secondary | ICD-10-CM | POA: Insufficient documentation

## 2022-04-29 LAB — CMP (CANCER CENTER ONLY)
ALT: 10 U/L (ref 0–44)
AST: 18 U/L (ref 15–41)
Albumin: 4.3 g/dL (ref 3.5–5.0)
Alkaline Phosphatase: 58 U/L (ref 38–126)
Anion gap: 5 (ref 5–15)
BUN: 20 mg/dL (ref 8–23)
CO2: 30 mmol/L (ref 22–32)
Calcium: 9.5 mg/dL (ref 8.9–10.3)
Chloride: 106 mmol/L (ref 98–111)
Creatinine: 0.91 mg/dL (ref 0.61–1.24)
GFR, Estimated: >60 mL/min (ref >60)
Glucose, Bld: 91 mg/dL (ref 70–99)
Potassium: 3.9 mmol/L (ref 3.5–5.1)
Sodium: 141 mmol/L (ref 135–145)
Total Bilirubin: 0.5 mg/dL (ref 0.3–1.2)
Total Protein: 7.1 g/dL (ref 6.5–8.1)

## 2022-04-29 LAB — CBC WITH DIFFERENTIAL (CANCER CENTER ONLY)
Abs Immature Granulocytes: 0.02 10*3/uL (ref 0.00–0.07)
Basophils Absolute: 0 10*3/uL (ref 0.0–0.1)
Basophils Relative: 0 %
Eosinophils Absolute: 0.2 10*3/uL (ref 0.0–0.5)
Eosinophils Relative: 3 %
HCT: 37.5 % — ABNORMAL LOW (ref 39.0–52.0)
Hemoglobin: 12.8 g/dL — ABNORMAL LOW (ref 13.0–17.0)
Immature Granulocytes: 0 %
Lymphocytes Relative: 35 %
Lymphs Abs: 3 10*3/uL (ref 0.7–4.0)
MCH: 33.1 pg (ref 26.0–34.0)
MCHC: 34.1 g/dL (ref 30.0–36.0)
MCV: 96.9 fL (ref 80.0–100.0)
Monocytes Absolute: 0.9 10*3/uL (ref 0.1–1.0)
Monocytes Relative: 10 %
Neutro Abs: 4.4 10*3/uL (ref 1.7–7.7)
Neutrophils Relative %: 52 %
Platelet Count: 153 10*3/uL (ref 150–400)
RBC: 3.87 MIL/uL — ABNORMAL LOW (ref 4.22–5.81)
RDW: 12.8 % (ref 11.5–15.5)
WBC Count: 8.5 10*3/uL (ref 4.0–10.5)
nRBC: 0 % (ref 0.0–0.2)

## 2022-04-29 MED ORDER — OXYMETAZOLINE HCL 0.05 % NA SOLN
1.0000 | Freq: Two times a day (BID) | NASAL | Status: DC
  Administered 2022-04-29: 1 via NASAL
  Filled 2022-04-29: qty 30

## 2022-04-29 NOTE — Progress Notes (Addendum)
Radiation Oncology         [phone] ________________________________  Name: Donald Larson MRN: [id number]  Date: 04/29/2022  DOB: 12-04-1945  Follow-Up Visit Note  CC: Donald Junior, MD  Donald Ames, MD  Diagnosis and Prior Radiotherapy:       ICD-10-CM   1. Cancer of supraglottis ([place])  C32.1       Cancer Staging  Cancer of supraglottis [facility]) Staging form: Larynx - Supraglottis, AJCC 8th Edition - Clinical stage from 04/30/2021: Stage IVA (cT1, cN2a, cM0) - Signed by Eppie Gibson, MD on 04/30/2021 Stage prefix: Initial diagnosis   CHIEF COMPLAINT:  Here for follow-up and surveillance of throat cancer  Narrative:    Donald Larson is here today for follow up of Larynx cancer. He completed radiation therapy on 07-23-21.  He is here to have physical exam and review of chest CT results   He is still smoking and does not demonstrate motivation to quit at this time.  Pain issues, if any: chronic back pain Using a feeding tube?: no Weight changes, if any: gaining weight now Wt Readings from Last 3 Encounters:  04/29/22 127 lb (57.6 kg)  04/01/22 127 lb 9.6 oz (57.9 kg)  10/22/21 120 lb 6 oz (54.6 kg)    Swallowing issues, if any: still coughs at times with swallowing, getting good nutrition Using fluoride trays daily? no Last ENT visit was on: ENT in July Other notable issues, if any: none at this time  Vitals:   04/29/22 1402  BP: 129/77  Pulse: 67  Resp: 18  Temp: (!) 97.5 F (36.4 C)  SpO2: 99%                   ALLERGIES:  has No Known Allergies.  Meds: Current Outpatient Medications  Medication Sig Dispense Refill   amLODipine (NORVASC) 10 MG tablet Take 10 mg by mouth daily.     Aspirin-Caffeine (BC FAST PAIN RELIEF PO) Take 2 packets by mouth daily as needed (pain).     omeprazole (PRILOSEC OTC) 20 MG tablet Take 20 mg by mouth See admin instructions. Take 20 mg daily, may take a second 20 mg dose as needed for acid reflux     Oxycodone HCl  10 MG TABS Take 10 mg by mouth 2 (two) times daily.     traZODone (DESYREL) 50 MG tablet Take 1 tablet by mouth at bedtime as needed.     hydrochlorothiazide (HYDRODIURIL) 12.5 MG tablet Take 12.5 mg by mouth daily as needed (swelling). (Patient not taking: Reported on 04/29/2022)     lidocaine (XYLOCAINE) 2 % solution Patient: Mix 1part 2% viscous lidocaine, 1part H20. Swish & swallow 47m of diluted mixture, 352m before meals and at bedtime, up to QID for soreness. (Patient not taking: Reported on 04/29/2022) 200 mL 3   prochlorperazine (COMPAZINE) 10 MG tablet TAKE 1 TABLET(10 MG) BY MOUTH EVERY 6 HOURS AS NEEDED FOR NAUSEA OR VOMITING (Patient not taking: Reported on 04/29/2022) 30 tablet 0   No current facility-administered medications for this encounter.    Physical Findings: The patient is in no acute distress. Patient is alert and oriented. Wt Readings from Last 3 Encounters:  04/29/22 127 lb (57.6 kg)  04/01/22 127 lb 9.6 oz (57.9 kg)  10/22/21 120 lb 6 oz (54.6 kg)    weight is 127 lb (57.6 kg). His oral temperature is 97.5 F (36.4 C) (abnormal). His blood pressure is 129/77 and his pulse is 67. His respiration  is 18 and oxygen saturation is 99%. .  General: Alert and oriented, in no acute distress HEENT: Head is normocephalic. Extraocular movements are intact.   Neck: Neck is notable for no masses Skin: Skin in treatment fields shows satisfactory healing  Heart: Regular in rate and rhythm with no murmurs, rubs, or gallops. Chest: Decreased breath sounds bilaterally Lymphatics: see Neck Exam Psychiatric: Judgment and insight are intact. Affect is appropriate.  PROCEDURE NOTE: After obtaining consent and spraying nasal cavity with topical oxymetazoline, the flexible endoscope was coated with lidocaine gel introduced and passed through the nasal cavity.  The nasopharynx, oropharynx, hypopharynx, and larynx  were then examined. No lesions appreciated in the mucosal axis.  The true  cords were symmetrically mobile.   Lab Findings: Lab Results  Component Value Date   WBC 4.2 06/26/2021   HGB 12.1 (L) 06/26/2021   HCT 34.9 (L) 06/26/2021   MCV 93.8 06/26/2021   PLT 128 (L) 06/26/2021    Lab Results  Component Value Date   TSH 2.947 05/08/2021    Radiographic Findings: CT Chest Wo Contrast  Result Date: 04/26/2022 CLINICAL DATA:  Head and neck cancer.  Restaging. EXAM: CT CHEST WITHOUT CONTRAST TECHNIQUE: Multidetector CT imaging of the chest was performed following the standard protocol without IV contrast. RADIATION DOSE REDUCTION: This exam was performed according to the departmental dose-optimization program which includes automated exposure control, adjustment of the mA and/or kV according to patient size and/or use of iterative reconstruction technique. COMPARISON:  PET-CT 10/21/2021 FINDINGS: Cardiovascular: The heart size is normal. No substantial pericardial effusion. Coronary artery calcification is evident. Mild atherosclerotic calcification is noted in the wall of the thoracic aorta. Mediastinum/Nodes: 10 mm short axis precarinal lymph node is stable. 9 mm short axis AP window lymph node is not substantially changed in the interval. No mediastinal lymphadenopathy. No evidence for gross hilar lymphadenopathy although assessment is limited by the lack of intravenous contrast on the current study. The esophagus has normal imaging features. There is no axillary lymphadenopathy. Lungs/Pleura: Centrilobular emphsyema noted. 4 mm right upper lobe nodule on 39/5 is stable. Previously characterized left upper lobe nodule is 9 mm today, increased from 7 mm previously. This lesion has irregular, spiculated margins and distorts the major fissure. Endobronchial lesion seen in the anterior right lower lobe previously has nearly resolved in the interval with only minimal endobronchial material visible on image 111/5 today. Tree-in-bud nodularity seen peripheral to this nodule on  the previous exam has resolved. No new suspicious pulmonary nodule or mass. No focal airspace consolidation. No pleural effusion. Upper Abdomen: Unremarkable. Musculoskeletal: No worrisome lytic or sclerotic osseous abnormality. IMPRESSION: 1. Interval apparent slight increase in size of the 9 mm left upper lobe pulmonary nodule. This lesion showed low level tracer uptake on recent PET-CT. Close follow-up recommended. 2. Near complete resolution of the endobronchial lesion seen in the anterior right lower lobe previously with only minimal endobronchial material visible today. Tree-in-bud nodularity seen peripheral to this nodule on the previous exam has resolved in the interval. 3. Stable 4 mm right upper lobe pulmonary nodule. 4. Stable borderline mediastinal lymphadenopathy. 5.  Aortic Atherosclerosis (ICD10-I70.0). Electronically Signed   By: Misty Stanley M.D.   On: 04/26/2022 14:33    Impression/Plan:    1) Head and Neck Cancer Status: Physical exam is negative for local or regional recurrence of his head and neck cancer.  He continues to smoke.  He does have a growing subcentimeter nodule in his lung that warrants  close follow-up.  I will see him back in 4 months with repeat chest CT at that time.  2) Nutritional Status: He is starting to gain weight back PEG tube: None  3) Risk Factors: The patient has again been educated about risk factors including alcohol and tobacco abuse; they understand that avoidance of alcohol and tobacco is important to prevent recurrences as well as other cancers.  He is still smoking and does not seem motivated to quit at this time    4) Swallowing: He denies any choking or aspiration to his knowledge.  He has declined speech-language pathology appointments against medical advice.  He does acknowledge some coughing at times when swallowing.  5)  Thyroid function: We will check his TSH today as he has not had this checked in a year.  Patient instructed to go to the lab  on his way out today     On date of service, in total, I spent 30 minutes on this encounter. Patient was seen in person. _____________________________________   Eppie Gibson, MD

## 2022-04-29 NOTE — Progress Notes (Signed)
Oncology Nurse Navigator Documentation   I met with Donald Larson after his visit with Dr. Isidore Moos today. He reports that he has gained 7 lbs since his last weight was taken. I escorted him upstairs and helped to get him checked in for his lab appointment. He and his grandson have my direct contact information to call me for any assistance they need in the future.  Harlow Asa RN, BSN, OCN Head & Neck Oncology Nurse [place] at [facility] Phone # [phone]  Fax # [phone]

## 2022-04-30 ENCOUNTER — Other Ambulatory Visit (HOSPITAL_BASED_OUTPATIENT_CLINIC_OR_DEPARTMENT_OTHER): Payer: Self-pay

## 2022-04-30 ENCOUNTER — Other Ambulatory Visit: Payer: Self-pay

## 2022-04-30 ENCOUNTER — Telehealth: Payer: Self-pay | Admitting: *Deleted

## 2022-04-30 DIAGNOSIS — Z923 Personal history of irradiation: Secondary | ICD-10-CM | POA: Diagnosis not present

## 2022-04-30 DIAGNOSIS — Z1329 Encounter for screening for other suspected endocrine disorder: Secondary | ICD-10-CM

## 2022-04-30 DIAGNOSIS — Z08 Encounter for follow-up examination after completed treatment for malignant neoplasm: Secondary | ICD-10-CM | POA: Diagnosis not present

## 2022-04-30 DIAGNOSIS — C321 Malignant neoplasm of supraglottis: Secondary | ICD-10-CM

## 2022-04-30 DIAGNOSIS — Z9221 Personal history of antineoplastic chemotherapy: Secondary | ICD-10-CM | POA: Diagnosis not present

## 2022-04-30 DIAGNOSIS — Z85831 Personal history of malignant neoplasm of soft tissue: Secondary | ICD-10-CM | POA: Insufficient documentation

## 2022-04-30 LAB — TSH: TSH: 7.805 u[IU]/mL — ABNORMAL HIGH (ref 0.350–4.500)

## 2022-04-30 LAB — T4, FREE: Free T4: 0.85 ng/dL (ref 0.61–1.12)

## 2022-04-30 NOTE — Telephone Encounter (Signed)
xxxx 

## 2022-05-05 ENCOUNTER — Encounter: Payer: Self-pay | Admitting: Oncology

## 2022-05-05 NOTE — Telephone Encounter (Signed)
xxxxxx

## 2022-06-03 ENCOUNTER — Telehealth: Payer: Self-pay | Admitting: *Deleted

## 2022-06-03 NOTE — Telephone Encounter (Signed)
Called patient to inform of Ct for 08-26-22- arrival time- 12:45 pm @ [facility], no restrictions to test, patient to receive CT results from Dr. Isidore Moos on 08-29-22 @ 2 pm, spoke with patient's grandson Sherryl Barters and he is aware of these appts. and the instructions

## 2022-06-10 DIAGNOSIS — J449 Chronic obstructive pulmonary disease, unspecified: Secondary | ICD-10-CM | POA: Diagnosis not present

## 2022-06-10 DIAGNOSIS — M169 Osteoarthritis of hip, unspecified: Secondary | ICD-10-CM | POA: Diagnosis not present

## 2022-06-10 DIAGNOSIS — C140 Malignant neoplasm of pharynx, unspecified: Secondary | ICD-10-CM | POA: Diagnosis not present

## 2022-06-10 DIAGNOSIS — I10 Essential (primary) hypertension: Secondary | ICD-10-CM | POA: Diagnosis not present

## 2022-06-10 DIAGNOSIS — K219 Gastro-esophageal reflux disease without esophagitis: Secondary | ICD-10-CM | POA: Diagnosis not present

## 2022-06-10 DIAGNOSIS — C760 Malignant neoplasm of head, face and neck: Secondary | ICD-10-CM | POA: Diagnosis not present

## 2022-08-19 NOTE — Progress Notes (Signed)
[name] presents to clinic for follow up for completion of radiation treatment for head and neck cancer (Larynx). He completed treatment on 07-23-21.  Pain issues, if any: Denies any mouth or throat pain Using a feeding tube?: N/A Weight changes, if any:  Wt Readings from Last 3 Encounters:  08/29/22 129 lb 8 oz (58.7 kg)  04/29/22 127 lb (57.6 kg)  04/01/22 127 lb 9.6 oz (57.9 kg)   Swallowing issues, if any: Denies Smoking or chewing tobacco? Continues to smoke Using fluoride trays daily? N/A Last ENT visit was on: 12/16/2021 Saw Dr. [name]  --Flexible Laryngoscopy  Flexible laryngoscopy shows patent anterior nasal cavity with minimal crusting, no discharge or infection.  Normal base of tongue and supraglottis Normal vocal cord mobility. Supraglottic changes from radiation therapy and resolved cancer. No evidence of mass, ulcer or tumor on today's examination. Hypopharynx clear without pooling of secretions.  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 He was treated with definitive chemoradiation therapy which completed in February 2023. Posttreatment PET scan shows no evidence of hypermetabolic disease in the larynx or neck. Patient reassured regarding today's findings, no evidence of recurrent disease at this time. Recommend continued close monitoring with follow-up at the cancer center in the fall and return to see [place] in approximately 6 months or sooner as warranted by worsening symptoms. Patient will continue to increase oral intake as tolerated. I strongly urged him to discontinue cigarette smoking.  --Monitor for additional concerns and follow-up in 6 months.   Other notable issues, if any: Continues to deal with dry mouth and vocal hoarseness. Reports his sister passed unexpectedly last month. He is melancholy today but reports he's  trying to take things day by day

## 2022-08-26 ENCOUNTER — Inpatient Hospital Stay: Payer: Medicare Other | Attending: Radiation Oncology

## 2022-08-26 ENCOUNTER — Ambulatory Visit (HOSPITAL_COMMUNITY)
Admission: RE | Admit: 2022-08-26 | Discharge: 2022-08-26 | Disposition: A | Discharge status: Home / Self Care | Payer: Medicare Other | Source: Ambulatory Visit | Attending: Radiation Oncology | Admitting: Radiation Oncology

## 2022-08-26 DIAGNOSIS — J181 Lobar pneumonia, unspecified organism: Secondary | ICD-10-CM | POA: Diagnosis not present

## 2022-08-26 DIAGNOSIS — C321 Malignant neoplasm of supraglottis: Secondary | ICD-10-CM | POA: Insufficient documentation

## 2022-08-29 ENCOUNTER — Other Ambulatory Visit: Payer: Self-pay

## 2022-08-29 ENCOUNTER — Ambulatory Visit
Admission: RE | Admit: 2022-08-29 | Discharge: 2022-08-29 | Disposition: A | Discharge status: Home / Self Care | Payer: Medicare Other | Source: Ambulatory Visit | Attending: Radiation Oncology | Admitting: Radiation Oncology

## 2022-08-29 ENCOUNTER — Inpatient Hospital Stay: Payer: Medicare Other

## 2022-08-29 ENCOUNTER — Encounter: Payer: Self-pay | Admitting: Radiation Oncology

## 2022-08-29 VITALS — BP 123/67 | HR 78 | Temp 97.1°F | Resp 18 | Ht 67.0 in | Wt 129.5 lb

## 2022-08-29 DIAGNOSIS — I700 Atherosclerosis of aorta: Secondary | ICD-10-CM | POA: Diagnosis not present

## 2022-08-29 DIAGNOSIS — R5383 Other fatigue: Secondary | ICD-10-CM

## 2022-08-29 DIAGNOSIS — C321 Malignant neoplasm of supraglottis: Secondary | ICD-10-CM

## 2022-08-29 DIAGNOSIS — Z923 Personal history of irradiation: Secondary | ICD-10-CM | POA: Insufficient documentation

## 2022-08-29 DIAGNOSIS — I2510 Atherosclerotic heart disease of native coronary artery without angina pectoris: Secondary | ICD-10-CM | POA: Insufficient documentation

## 2022-08-29 DIAGNOSIS — Z8521 Personal history of malignant neoplasm of larynx: Secondary | ICD-10-CM | POA: Insufficient documentation

## 2022-08-29 DIAGNOSIS — Z79899 Other long term (current) drug therapy: Secondary | ICD-10-CM | POA: Insufficient documentation

## 2022-08-29 DIAGNOSIS — J189 Pneumonia, unspecified organism: Secondary | ICD-10-CM

## 2022-08-29 MED ORDER — OXYMETAZOLINE HCL 0.05 % NA SOLN
2.0000 | Freq: Once | NASAL | Status: AC
  Administered 2022-08-29: 2 via NASAL
  Filled 2022-08-29: qty 30

## 2022-08-29 NOTE — Progress Notes (Signed)
Oncology Nurse Navigator Documentation   I met with Donald Larson during his follow up with Donald Larson today. He is maintaining his weight but also dealing with personal issues including his sister's death and having to move to a new home. Donald Larson has referred him to pulmonology due to his recent chest CT showing signs of atypical pneumonia. He will also be referred to ENT for findings on Laryngoscopy today. He will see Medical oncology in 2 months and Donald Larson again in 4 months with TSH and free T4 labs beforehand. Donald Larson asked that I call his grandson Donald Larson with the above information which I did. Donald Larson is aware that he will be receiving calls regarding new appointments for Donald Larson. Donald Larson has my direct contact information and I encouraged him to call me for any questions or concerns in the future.  Donald Asa RN, BSN, OCN Head & Neck Oncology Nurse [place] at [facility] Phone # [phone]  Fax # [phone]

## 2022-08-29 NOTE — Progress Notes (Signed)
Radiation Oncology         [phone] ________________________________  Name: Donald Larson MRN: [id number]  Date: 08/29/2022  DOB: May 21, 1946  Follow-Up Visit Note  CC: Donald Junior, MD  Donald Ames, MD  Diagnosis and Prior Radiotherapy:       ICD-10-CM   1. 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 He completed treatment on 07-23-21.  Pain issues, if any: Denies any mouth or throat pain Using a feeding tube?: N/A Weight changes, if any:  Wt Readings from Last 3 Encounters:  08/29/22 129 lb 8 oz (58.7 kg)  04/29/22 127 lb (57.6 kg)  04/01/22 127 lb 9.6 oz (57.9 kg)   Swallowing issues, if any: Denies Smoking or chewing tobacco? Continues to smoke Using fluoride trays daily? N/A Last ENT visit was on: Has not seen otolaryngology since July  Other notable issues, if any: Continues to deal with dry mouth and vocal hoarseness. Reports his sister passed unexpectedly last month. He is melancholy today but reports he's trying to take things day by day  He denies any new breathing issues    ALLERGIES:  has No Known Allergies.  Meds: Current Outpatient Medications  Medication Sig Dispense Refill   amLODipine (NORVASC) 10 MG tablet Take 10 mg by mouth daily.     Aspirin-Caffeine (BC FAST PAIN RELIEF PO) Take 2 packets by mouth daily as needed (pain).     hydrochlorothiazide (HYDRODIURIL) 12.5 MG tablet Take 12.5 mg by mouth daily as needed (swelling).  (Patient not taking: Reported on 04/29/2022)     lidocaine (XYLOCAINE) 2 % solution Patient: Mix 1part 2% viscous lidocaine, 1part H20. Swish & swallow 38mL of diluted mixture, 54min before meals and at bedtime, up to QID for soreness. (Patient not taking: Reported on 04/29/2022) 200 mL 3   omeprazole (PRILOSEC OTC) 20 MG tablet Take 20 mg by mouth See admin instructions. Take 20 mg daily, may take a second 20 mg dose as needed for acid reflux     Oxycodone HCl 10 MG TABS Take 10 mg by mouth 2 (two) times daily.     prochlorperazine (COMPAZINE) 10 MG tablet TAKE 1 TABLET(10 MG) BY MOUTH EVERY 6 HOURS AS NEEDED FOR NAUSEA OR VOMITING (Patient not taking: Reported on 04/29/2022) 30 tablet 0   traZODone (DESYREL) 50 MG tablet Take 1 tablet by mouth at bedtime as needed.     No current facility-administered medications for this encounter.    Physical Findings: The patient is in no acute distress. Patient is alert and oriented. Wt Readings from Last 3 Encounters:  08/29/22 129 lb 8 oz (58.7 kg)  04/29/22 127 lb (57.6 kg)  04/01/22 127 lb 9.6 oz (57.9 kg)    height is 5\' 7"  (1.702 m) and weight is 129 lb 8 oz (58.7 kg). His temporal temperature is 97.1 F (36.2 C) (abnormal). His blood pressure is 123/67 and his pulse is 78. His respiration is 18 and oxygen  saturation is 98%. .  General: Alert and oriented, in no acute distress HEENT: Head is normocephalic. Extraocular movements are intact.   Neck: Neck is notable for no masses Skin: Skin in treatment fields shows satisfactory healing  Heart: Regular in rate and rhythm with no murmurs, rubs, or gallops. Chest: Decreased breath sounds bilaterally Lymphatics: see Neck Exam Psychiatric: Judgment and insight are intact. Affect is appropriate.  PROCEDURE NOTE: After obtaining consent and spraying nasal cavity with topical oxymetazoline, the flexible endoscope was coated with lidocaine gel introduced and passed through the nasal cavity.  The  nasopharynx, oropharynx, hypopharynx, and larynx  were then examined.  He has an area of swelling in the supraglottis at midline just superior to the anterior commissure of the true cords - the mucosa is smooth, tumor cannot be ruled out; the true cords could not be completely visualized but appeared to be symmetrically mobile.  See photo below  Lab Findings: Lab Results  Component Value Date   WBC 8.5 04/29/2022   HGB 12.8 (L) 04/29/2022   HCT 37.5 (L) 04/29/2022   MCV 96.9 04/29/2022   PLT 153 04/29/2022    Lab Results  Component Value Date   TSH 7.805 (H) 04/29/2022    Radiographic Findings: CT Chest Wo Contrast  Result Date: 08/27/2022 CLINICAL DATA:  History of head neck cancer with spiculated nodule on previous chest imaging. * Tracking Code: BO * EXAM: CT CHEST WITHOUT CONTRAST TECHNIQUE: Multidetector CT imaging of the chest was performed following the standard protocol without IV contrast. RADIATION DOSE REDUCTION: This exam was performed according to the departmental dose-optimization program which includes automated exposure control, adjustment of the mA and/or kV according to patient size and/or use of iterative reconstruction technique. COMPARISON:  May 2023 and November of 2023 23, PET and CT imaging respectively. FINDINGS: Cardiovascular: Calcified aortic atherosclerosis. No aortic dilation. Three-vessel coronary artery disease. No pericardial effusion or nodularity with normal heart size. Central pulmonary vasculature is of normal caliber. Limited assessment of cardiovascular structures given lack of intravenous contrast. Mediastinum/Nodes: Increase in size of precarinal lymph node. 15 mm previously 10 mm (image 62/2) increase in size of similar degree with respect to LEFT paratracheal nodal tissue on image 58/2 now measuring 11 mm previously 9 mm. Other small lymph nodes less than cm are similarly slightly enlarged compared to previous imaging. No gross hilar lymphadenopathy. No  subcarinal lymphadenopathy or thoracic inlet lymphadenopathy. No axillary lymphadenopathy. Lungs/Pleura: New areas of consolidative change with surrounding ground-glass in the anterior chest involving bilateral upper lobes, RIGHT middle lobe, lingula and to a lesser extent the lower lobes where there is mainly peribronchovascular nodular change. Nonspecific spiculated nodule along the periphery of the LEFT upper lobe (image 53/7) 9 x 7 mm appears similar to previous imaging. New small nodule or enlarging area of nodularity adjacent to bronchovascular structures in the RIGHT upper lobe (image 36/7) approximately 7 mm. Subtle area of mucoid impaction favored in this area on previous imaging. Findings are not associated with pleural effusion. There is some material in the dependent distal trachea and mainstem bronchi and there is evidence of numerous filling defects within peripheral bronchi mainly in the affected segments of lung. These areas also sutures associated with bronchial wall thickening Upper Abdomen: Incidental imaging of upper abdominal contents without acute process. Scattered small upper abdominal lymph nodes are not changed compared to previous imaging. Largest of these lymph nodes in the upper abdomen is less than a cm short axis. Musculoskeletal: No chest wall  mass. No acute bone process. Spinal degenerative changes and osteopenia. IMPRESSION: 1. New areas of consolidative change with surrounding ground-glass in the anterior chest involving bilateral upper lobes, RIGHT middle lobe, lingula and to a lesser extent the lower lobes where there is mainly peribronchovascular nodular change. Findings are most suggestive of multifocal pneumonia aspiration changes could have this appearance given extensive endobronchial material though distribution would be atypical for aspiration. Therefore, consider the possibility of atypical infection. 2. Small RIGHT upper lobe nodule is suggested. LEFT upper lobe nodule  is similar to prior imaging. These findings warrant attention on follow-up with potential slight increase in size of nodularity in the RIGHT upper lobe. 3. Slight increase in size of mediastinal lymph nodes particularly anterior to the carina. In the current setting these are nonspecific and potentially reactive. Short interval follow-up may be helpful to distinguish from malignant involvement. 4. Three-vessel coronary artery disease. 5. Aortic atherosclerosis. 6. Scattered small upper abdominal lymph nodes are not changed compared to previous imaging. Largest of these lymph nodes in the upper abdomen is less than a cm short axis. On follow-up attention. Aortic Atherosclerosis (ICD10-I70.0). Electronically Signed   By: Zetta Bills M.D.   On: 08/27/2022 15:32    Impression/Plan:    1) Head and Neck Cancer Status: Laryngoscopy demonstrates some swelling in the supraglottis at midline just superior to the anterior commissure.  He has not seen otolaryngology for about 8 months.  Our head and neck navigator was present today and she is going to try to get him follow-up in their clinic as soon as possible.  Regarding his chest CT, I personally reviewed those images.  The findings are concerning for possible atypical pneumonia among other things.  Referral will be made to pulmonology for evaluation, treatment as appropriate, and further surveillance with imaging as appropriate.  2) Nutritional Status:  Wt Readings from Last 3 Encounters:  08/29/22 129 lb 8 oz (58.7 kg)  04/29/22 127 lb (57.6 kg)  04/01/22 127 lb 9.6 oz (57.9 kg)    PEG tube: None  3) Risk Factors: The patient has again been educated about risk factors including alcohol and tobacco abuse; they understand that avoidance of alcohol and tobacco is important to prevent recurrences as well as other cancers.  He is still smoking and does not seem motivated to quit at this time    4) Swallowing:  He has declined speech-language pathology  appointments against medical advice.  He does acknowledge some coughing at times when swallowing.  It is possible he is aspirating.  Unfortunately he is overwhelmed by appointments and I do not think it is realistic that he would attend SLP at this time.  Will prioritize the appointments in #1  5)  Thyroid function: We will check his TSH at his next appointment and continue to do so annually unless this is eventually done by his primary doctor or med onc Lab Results  Component Value Date   TSH 7.805 (H) 04/29/2022   6) He was also lost to follow-up with medical oncology.  Our patient navigator will address this  7) I will see him back in 4 months.    On date of service, in total, I spent 30 minutes on this encounter. Patient was seen in person. _____________________________________   Eppie Gibson, MD

## 2022-09-01 ENCOUNTER — Telehealth: Payer: Self-pay | Admitting: *Deleted

## 2022-09-01 NOTE — Telephone Encounter (Signed)
CALLED PATIENT'S SON Donald Larson TO INFORM OF APPT. WITH DR. Lamonte Sakai ON  09-04-22- ARRIVAL TIME- 10:15 AM, ADDRESS [address], [address], PH. NUMBER- [phone], SPOKE WITH PATIENT'S SON Donald Larson AND HE IS AWARE OF THIS APPT.

## 2022-09-04 ENCOUNTER — Ambulatory Visit (INDEPENDENT_AMBULATORY_CARE_PROVIDER_SITE_OTHER): Payer: Medicare Other | Admitting: Emergency Medicine

## 2022-09-04 ENCOUNTER — Encounter: Payer: Self-pay | Admitting: Emergency Medicine

## 2022-09-04 VITALS — BP 132/76 | HR 71 | Temp 97.8°F | Ht 70.0 in | Wt 126.6 lb

## 2022-09-04 DIAGNOSIS — F17200 Nicotine dependence, unspecified, uncomplicated: Secondary | ICD-10-CM | POA: Diagnosis not present

## 2022-09-04 DIAGNOSIS — R9389 Abnormal findings on diagnostic imaging of other specified body structures: Secondary | ICD-10-CM | POA: Diagnosis not present

## 2022-09-04 DIAGNOSIS — R911 Solitary pulmonary nodule: Secondary | ICD-10-CM

## 2022-09-04 MED ORDER — AMOXICILLIN-POT CLAVULANATE 875-125 MG PO TABS: 1.0000 | ORAL_TABLET | Freq: Two times a day (BID) | ORAL | 0 refills | Status: DC
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
## 2022-09-04 NOTE — Assessment & Plan Note (Signed)
Heavy use.  Discussed cessation with him today.  Also talked to him about COPD, possibly starting bronchodilators.  He wants to defer.  I will give him albuterol inhaler to have to use as needed.

## 2022-09-04 NOTE — Assessment & Plan Note (Signed)
New bilateral infiltrates compared with November 2023, multifocal, appear to be most consistent with pneumonia although he is at risk for atypical infection, metastatic disease.  I talked to him about the differential diagnosis.  He denies any significant aspiration although he may be having occult aspiration causing these infiltrates.  We will treat with Augmentin for 2 weeks and repeat a scan.  If the infiltrates persist then I think needs bronchoscopy for BAL, culture data, cytology and probably biopsies.  He has stable pulmonary nodular disease as well, may merit navigation to these locations for biopsy.

## 2022-09-04 NOTE — Progress Notes (Signed)
Oncology Nurse Navigator Documentation   I left a VM with Mr. Tinklenberg grandson Sherryl Barters informing him of his grandfather's appointment on 09/11/22 at 3:20 with Dr. Constance Holster. I informed him that because this is an [facility] facility that [facility] could not provide transportation. I left my direct contact information for him to call me back if he had any questions or concerns.  Harlow Asa RN, BSN, OCN Head & Neck Oncology Nurse [place] [facility] Phone # [phone]  Fax # [phone]

## 2022-09-04 NOTE — Patient Instructions (Signed)
We reviewed your CT scan of the chest today. Please take Augmentin 875 mg twice a day for 2 weeks until completely gone. We will repeat your CT scan of the chest in 1 month to look for interval change, resolution.  Depending on that scan we will decide whether any other evaluation needs to be done including possible bronchoscopy for cultures, biopsies. Go ahead and follow-up with ENT as planned Continue to follow Dr. Isidore Moos Work on decreasing your cigarettes. We will give you a prescription for albuterol.  You can use 2 puffs up to every 4 hours if needed for shortness of breath, chest tightness, wheezing. We will revisit starting daily inhaler medication at a future visit. Follow Dr. Lamonte Sakai in 1 month after that CT scan so we can review.

## 2022-09-11 DIAGNOSIS — F17210 Nicotine dependence, cigarettes, uncomplicated: Secondary | ICD-10-CM | POA: Diagnosis not present

## 2022-09-11 DIAGNOSIS — Z923 Personal history of irradiation: Secondary | ICD-10-CM | POA: Diagnosis not present

## 2022-09-11 DIAGNOSIS — C131 Malignant neoplasm of aryepiglottic fold, hypopharyngeal aspect: Secondary | ICD-10-CM | POA: Diagnosis not present

## 2022-09-16 ENCOUNTER — Telehealth: Payer: Self-pay | Admitting: Hematology and Oncology

## 2022-09-16 NOTE — Telephone Encounter (Signed)
Spoke with patient grandson confirming upcoming appointment

## 2022-09-19 ENCOUNTER — Encounter: Payer: Self-pay | Admitting: Oncology

## 2022-09-23 ENCOUNTER — Emergency Department (HOSPITAL_COMMUNITY)
Admission: EM | Admit: 2022-09-23 | Discharge: 2022-09-23 | Discharge status: Against Medical Advice | Payer: Medicare HMO | Attending: Emergency Medicine | Admitting: Emergency Medicine

## 2022-09-23 ENCOUNTER — Other Ambulatory Visit: Payer: Self-pay

## 2022-09-23 DIAGNOSIS — Z8521 Personal history of malignant neoplasm of larynx: Secondary | ICD-10-CM | POA: Diagnosis not present

## 2022-09-23 DIAGNOSIS — R4182 Altered mental status, unspecified: Secondary | ICD-10-CM | POA: Diagnosis not present

## 2022-09-23 DIAGNOSIS — R4781 Slurred speech: Secondary | ICD-10-CM | POA: Diagnosis not present

## 2022-09-23 DIAGNOSIS — Z5321 Procedure and treatment not carried out due to patient leaving prior to being seen by health care provider: Secondary | ICD-10-CM | POA: Insufficient documentation

## 2022-09-23 NOTE — ED Notes (Addendum)
Pt refusing blood work. PA and RN discussed we would like him to stay and get medically evaluated. Grandson frustrated. PA talked to them at length about importance of staying and concern. Pt still decided to leave. Grandson appreciative.

## 2022-09-23 NOTE — ED Provider Triage Note (Cosign Needed Addendum)
Emergency Medicine Provider Triage Evaluation Note  Donald Larson , a 77 y.o. male  was evaluated in triage.  Pt brought in by grandson for evaluation of altered mental status.  Grandson states that when he left for work this morning his grandfather was in his normal state of health and when he got back from work around 5 PM this afternoon grandfather was confused with slurred speech.  Patient unable to give an exact last known well time.  States that he "felt weird" throughout the day but difficulty describing symptoms.  He denies chest pain, shortness of breath, abdominal pain.  Lucila Maine states that patient has not recently had cough, congestion, nausea, vomiting, diarrhea, or really other complaints apart from some mild fatigue.  Patient was recently diagnosed with a pneumonia and started on antibiotics.  Lucila Maine states that there was some question if patient had possible metastatic disease in his lungs.  He does have a history of throat cancer and has been in remission for about a year now.  He was seen by pulmonology in the office and they plan to do a repeat CT scan this Friday to reassess his lungs after the antibiotics.  Lucila Maine states that patient speech has improved but does still seem slightly off from his baseline.  Review of Systems  Positive: See HPI Negative: See HPI   Physical Exam  BP (!) 138/92 (BP Location: Right Arm)   Pulse 73   Temp 98.7 F (37.1 C) (Oral)   Resp 18   SpO2 91%  Gen:   Awake, no distress   Resp:  Normal effort lungs clear to auscultation MSK:   Moves extremities without difficulty no lower extremity edema Other:  Alert and oriented times person, place, not time-grandson states occasionally patient will have difficulty with date/time; 5/5 strength to bilateral upper and lower extremities, no pronator drift, no facial droop, normal sensation, normal speech, regular rate and rhythm, abdomen soft and nontender  Medical Decision Making  Medically screening  exam initiated at 6:46 PM.  Appropriate orders placed.  THOM OLLINGER was informed that the remainder of the evaluation will be completed by another provider, this initial triage assessment does not replace that evaluation, and the importance of remaining in the ED until their evaluation is complete.   Extensive discussion with both patient and grandson.  Grandson called me to bedside stating that patient wants to go home.  Patient reported that he is feeling much better.  He is now alert and oriented.  Patient states that he is not having any symptoms and does not like hospitals or medical care and does not want to remain here any longer.  He states that his grandson takes good care of him and he would prefer to go home.  Discussed with patient and grandson at bedside the importance of undergoing further medical evaluation with patient's symptoms earlier today including risk/benefits of staying for further evaluation and treatment.  Patient repeatedly requesting to go home and does not want to stay.  Lucila Maine states that patient's sister recently passed away on the fifth floor and thus patient does not want to be associated with hospitals.  Offered antianxiety medication to help patient with further workup and treatment but patient still declines.  Grandson at bedside willing to take patient home and care for him.  Discussed with patient and grandson strict ED return precautions and patient/grandson agreeable to come back for any worsening condition.  Discussed with patient/grandson unable to rule out emergent condition without  further workup or testing and patient is declining blood work, imaging, any further testing today.  Patient and grandson expressed understanding of this.  Patient stable at time of discharge home and will leave AGAINST MEDICAL ADVICE from triage.   Tonette Lederer, PA-C 09/23/22 1850    Tonette Lederer, PA-C 09/23/22 1927

## 2022-09-23 NOTE — ED Triage Notes (Signed)
Pt's grandson reports when he left for work this morning he was at his baseline but when he returned from work he was much more lethargic, confused, with slurred speech, and not knowing how to do things he would normally. Just finished abx for pneumonia and is supposed to follow up with pulmonary next week.

## 2022-09-26 ENCOUNTER — Ambulatory Visit (HOSPITAL_COMMUNITY)
Admission: RE | Admit: 2022-09-26 | Discharge: 2022-09-26 | Disposition: A | Discharge status: Home / Self Care | Payer: Medicare HMO | Source: Ambulatory Visit | Attending: Emergency Medicine | Admitting: Emergency Medicine

## 2022-09-26 DIAGNOSIS — R911 Solitary pulmonary nodule: Secondary | ICD-10-CM | POA: Diagnosis present

## 2022-10-02 ENCOUNTER — Ambulatory Visit: Payer: Medicare HMO | Admitting: Emergency Medicine

## 2022-10-02 ENCOUNTER — Encounter: Payer: Self-pay | Admitting: Emergency Medicine

## 2022-10-02 VITALS — BP 132/80 | HR 74 | Ht 70.0 in | Wt 124.0 lb

## 2022-10-02 DIAGNOSIS — R9389 Abnormal findings on diagnostic imaging of other specified body structures: Secondary | ICD-10-CM | POA: Diagnosis not present

## 2022-10-02 DIAGNOSIS — F17200 Nicotine dependence, unspecified, uncomplicated: Secondary | ICD-10-CM

## 2022-10-02 DIAGNOSIS — Z01812 Encounter for preprocedural laboratory examination: Secondary | ICD-10-CM | POA: Diagnosis not present

## 2022-10-02 NOTE — Assessment & Plan Note (Signed)
Still smoking a little less than a pack a day.  Discussed cessation.  He denies any dyspnea.  Is not on any BD therapy although I suspect he has some underlying COPD.  We will consider PFT, empiric trial of bronchodilators going forward.

## 2022-10-02 NOTE — Assessment & Plan Note (Signed)
Pulmonary infiltrates have improved, right upper lobe nodule is stable in size but the posterior left upper lobe pulmonary nodule has slowly enlarged, remain suspicious for primary malignancy versus metastatic disease.  I suspect this is a primary lung cancer.  Talk to him today about the differential diagnosis, indication for bronchoscopy.  We will arrange for robotic assisted navigational bronchoscopy to biopsy the left upper lobe nodule, possibly also the right upper lobe nodule if it is accessible.  Rationale, risks, benefits discussed.  He agrees to proceed.  We will try to get this done on 10/13/2022.
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
## 2022-10-09 ENCOUNTER — Encounter (HOSPITAL_COMMUNITY): Payer: Self-pay | Admitting: Emergency Medicine

## 2022-10-09 ENCOUNTER — Other Ambulatory Visit: Payer: Self-pay

## 2022-10-09 NOTE — Progress Notes (Signed)
I called Donald Larson's phone, his grandson, designated party release, Donald Larson answered.  Donald Larson states that Donald Larson has not complained of chest pain or shortness of breath at rest. Donald Larson denies having any s/s of Covid in the household, also denies any known exposure to Covid.   Donald Larson denies that Donald Larson has had any s/s of upper or lower respiratory infection in the past 8 weeks.   Donald Larson PCP is Dr. Lerry Liner. Donald Larson stated that patient is not taking Amlodipine any longer, he is not sure if he is supposed to or not.  Donald Larson was seen in the ED on 09/23/22 for altered mental status.  Donald Larson reported that when he came home around 5 pm, patient was confused.  Donald Larson refused to have labs drawn and left AMA. Donald Larson states that the mental status change was new, patient became clearer on the way home and within a short period of time patient was back to normal. Donald Larson reports that Donald Larson has been in the hot sun that day and thinks that may be what caused the change in mental status.  Patient has been alert and oriented since that day.

## 2022-10-13 ENCOUNTER — Ambulatory Visit (HOSPITAL_BASED_OUTPATIENT_CLINIC_OR_DEPARTMENT_OTHER): Payer: Medicare HMO | Admitting: Anesthesiology

## 2022-10-13 ENCOUNTER — Ambulatory Visit (HOSPITAL_COMMUNITY)
Admission: RE | Admit: 2022-10-13 | Discharge: 2022-10-13 | Disposition: A | Discharge status: Home / Self Care | Payer: Medicare HMO | Attending: Emergency Medicine | Admitting: Emergency Medicine

## 2022-10-13 ENCOUNTER — Ambulatory Visit (HOSPITAL_COMMUNITY): Payer: Medicare HMO

## 2022-10-13 ENCOUNTER — Other Ambulatory Visit: Payer: Self-pay

## 2022-10-13 ENCOUNTER — Encounter (HOSPITAL_COMMUNITY)
Admission: RE | Disposition: A | Discharge status: Home / Self Care | Payer: Self-pay | Source: Home / Self Care | Attending: Emergency Medicine

## 2022-10-13 ENCOUNTER — Ambulatory Visit (HOSPITAL_COMMUNITY): Payer: Medicare HMO | Admitting: Anesthesiology

## 2022-10-13 ENCOUNTER — Encounter (HOSPITAL_COMMUNITY): Payer: Self-pay | Admitting: Emergency Medicine

## 2022-10-13 DIAGNOSIS — F17210 Nicotine dependence, cigarettes, uncomplicated: Secondary | ICD-10-CM

## 2022-10-13 DIAGNOSIS — C3411 Malignant neoplasm of upper lobe, right bronchus or lung: Secondary | ICD-10-CM

## 2022-10-13 DIAGNOSIS — R918 Other nonspecific abnormal finding of lung field: Secondary | ICD-10-CM | POA: Diagnosis present

## 2022-10-13 DIAGNOSIS — C3412 Malignant neoplasm of upper lobe, left bronchus or lung: Secondary | ICD-10-CM | POA: Insufficient documentation

## 2022-10-13 DIAGNOSIS — Z79899 Other long term (current) drug therapy: Secondary | ICD-10-CM | POA: Insufficient documentation

## 2022-10-13 DIAGNOSIS — Z923 Personal history of irradiation: Secondary | ICD-10-CM | POA: Diagnosis not present

## 2022-10-13 DIAGNOSIS — J449 Chronic obstructive pulmonary disease, unspecified: Secondary | ICD-10-CM | POA: Diagnosis not present

## 2022-10-13 DIAGNOSIS — Z1152 Encounter for screening for COVID-19: Secondary | ICD-10-CM | POA: Insufficient documentation

## 2022-10-13 DIAGNOSIS — K219 Gastro-esophageal reflux disease without esophagitis: Secondary | ICD-10-CM | POA: Diagnosis not present

## 2022-10-13 DIAGNOSIS — R9389 Abnormal findings on diagnostic imaging of other specified body structures: Secondary | ICD-10-CM | POA: Diagnosis not present

## 2022-10-13 DIAGNOSIS — Z8521 Personal history of malignant neoplasm of larynx: Secondary | ICD-10-CM | POA: Insufficient documentation

## 2022-10-13 DIAGNOSIS — I10 Essential (primary) hypertension: Secondary | ICD-10-CM

## 2022-10-13 HISTORY — PX: BRONCHIAL BIOPSY: SHX5109

## 2022-10-13 HISTORY — PX: BRONCHIAL BRUSHINGS: SHX5108

## 2022-10-13 HISTORY — PX: BRONCHIAL NEEDLE ASPIRATION BIOPSY: SHX5106

## 2022-10-13 HISTORY — PX: BRONCHIAL WASHINGS: SHX5105

## 2022-10-13 HISTORY — DX: Malignant (primary) neoplasm, unspecified: C80.1

## 2022-10-13 HISTORY — PX: FIDUCIAL MARKER PLACEMENT: SHX6858

## 2022-10-13 HISTORY — DX: Chronic obstructive pulmonary disease, unspecified: J44.9

## 2022-10-13 HISTORY — PX: VIDEO BRONCHOSCOPY WITH RADIAL ENDOBRONCHIAL ULTRASOUND: SHX6849

## 2022-10-13 LAB — BASIC METABOLIC PANEL
Anion gap: 7 (ref 5–15)
BUN: 14 mg/dL (ref 8–23)
CO2: 25 mmol/L (ref 22–32)
Calcium: 8.5 mg/dL — ABNORMAL LOW (ref 8.9–10.3)
Chloride: 108 mmol/L (ref 98–111)
Creatinine, Ser: 0.84 mg/dL (ref 0.61–1.24)
GFR, Estimated: >60 mL/min (ref >60)
Glucose, Bld: 92 mg/dL (ref 70–99)
Potassium: 3.8 mmol/L (ref 3.5–5.1)
Sodium: 140 mmol/L (ref 135–145)

## 2022-10-13 LAB — CBC
HCT: 38.1 % — ABNORMAL LOW (ref 39.0–52.0)
Hemoglobin: 12.7 g/dL — ABNORMAL LOW (ref 13.0–17.0)
MCH: 31.9 pg (ref 26.0–34.0)
MCHC: 33.3 g/dL (ref 30.0–36.0)
MCV: 95.7 fL (ref 80.0–100.0)
Platelets: 113 10*3/uL — ABNORMAL LOW (ref 150–400)
RBC: 3.98 MIL/uL — ABNORMAL LOW (ref 4.22–5.81)
RDW: 14.4 % (ref 11.5–15.5)
WBC: 5.3 10*3/uL (ref 4.0–10.5)
nRBC: 0 % (ref 0.0–0.2)

## 2022-10-13 LAB — SARS CORONAVIRUS 2 BY RT PCR: SARS Coronavirus 2 by RT PCR: NEGATIVE

## 2022-10-13 LAB — CULTURE, BAL-QUANTITATIVE W GRAM STAIN: Gram Stain: NONE SEEN

## 2022-10-13 SURGERY — BRONCHOSCOPY, WITH BIOPSY USING ELECTROMAGNETIC NAVIGATION
Anesthesia: General | Laterality: Bilateral

## 2022-10-13 MED ORDER — LIDOCAINE 2% (20 MG/ML) 5 ML SYRINGE
INTRAMUSCULAR | Status: DC | PRN
  Administered 2022-10-13: 60 mg via INTRAVENOUS

## 2022-10-13 MED ORDER — AMISULPRIDE (ANTIEMETIC) 5 MG/2ML IV SOLN: 10.0000 mg | Freq: Once | INTRAVENOUS | Status: DC | PRN

## 2022-10-13 MED ORDER — FENTANYL CITRATE (PF) 100 MCG/2ML IJ SOLN
INTRAMUSCULAR | Status: DC | PRN
  Administered 2022-10-13: 100 ug via INTRAVENOUS

## 2022-10-13 MED ORDER — PROMETHAZINE HCL 25 MG/ML IJ SOLN: 6.2500 mg | INTRAMUSCULAR | Status: DC | PRN

## 2022-10-13 MED ORDER — OXYCODONE HCL 5 MG PO TABS: 5.0000 mg | ORAL_TABLET | Freq: Once | ORAL | Status: DC | PRN

## 2022-10-13 MED ORDER — DEXAMETHASONE SODIUM PHOSPHATE 4 MG/ML IJ SOLN
INTRAMUSCULAR | Status: DC | PRN
  Administered 2022-10-13: 10 mg via INTRAVENOUS

## 2022-10-13 MED ORDER — PROPOFOL 10 MG/ML IV BOLUS
INTRAVENOUS | Status: DC | PRN
  Administered 2022-10-13: 150 mg via INTRAVENOUS

## 2022-10-13 MED ORDER — ONDANSETRON HCL 4 MG/2ML IJ SOLN
INTRAMUSCULAR | Status: DC | PRN
  Administered 2022-10-13: 4 mg via INTRAVENOUS

## 2022-10-13 MED ORDER — ROCURONIUM BROMIDE 10 MG/ML (PF) SYRINGE
PREFILLED_SYRINGE | INTRAVENOUS | Status: DC | PRN
  Administered 2022-10-13: 50 mg via INTRAVENOUS
  Administered 2022-10-13: 10 mg via INTRAVENOUS

## 2022-10-13 MED ORDER — OXYCODONE HCL 5 MG/5ML PO SOLN: 5.0000 mg | Freq: Once | ORAL | Status: DC | PRN

## 2022-10-13 MED ORDER — SUGAMMADEX SODIUM 200 MG/2ML IV SOLN
INTRAVENOUS | Status: DC | PRN
  Administered 2022-10-13: 200 mg via INTRAVENOUS

## 2022-10-13 MED ORDER — PROPOFOL 500 MG/50ML IV EMUL
INTRAVENOUS | Status: DC | PRN
  Administered 2022-10-13: 150 ug/kg/min via INTRAVENOUS

## 2022-10-13 MED ORDER — HYDROMORPHONE HCL 1 MG/ML IJ SOLN: 0.2500 mg | INTRAMUSCULAR | Status: DC | PRN

## 2022-10-13 MED ORDER — LACTATED RINGERS IV SOLN: INTRAVENOUS | Status: DC

## 2022-10-13 MED ORDER — CHLORHEXIDINE GLUCONATE 0.12 % MT SOLN: 15.0000 mL | Freq: Once | OROMUCOSAL | Status: DC

## 2022-10-13 SURGICAL SUPPLY — 2 items
Super Lock Fiducial Marker IMPLANT
SuperLock Fiducial Marker IMPLANT

## 2022-10-13 NOTE — Anesthesia Postprocedure Evaluation (Signed)
Anesthesia Post Note  Patient: MAXXIM LASHOMB  Procedure(s) Performed: ROBOTIC ASSISTED NAVIGATIONAL BRONCHOSCOPY (Bilateral) BRONCHIAL BIOPSIES BRONCHIAL BRUSHINGS BRONCHIAL NEEDLE ASPIRATION BIOPSIES BRONCHIAL WASHINGS FIDUCIAL MARKER PLACEMENT VIDEO BRONCHOSCOPY WITH RADIAL ENDOBRONCHIAL ULTRASOUND     Patient location during evaluation: PACU Anesthesia Type: General Level of consciousness: awake and alert Pain management: pain level controlled Vital Signs Assessment: post-procedure vital signs reviewed and stable Respiratory status: spontaneous breathing, nonlabored ventilation and respiratory function stable Cardiovascular status: blood pressure returned to baseline and stable Postop Assessment: no apparent nausea or vomiting Anesthetic complications: no   No notable events documented.  Last Vitals:  Vitals:   10/13/22 1045 10/13/22 1100  BP: (!) 161/73 (!) 166/68  Pulse: 61 62  Resp: (!) 25 19  Temp:    SpO2: 96% 96%    Last Pain:  Vitals:   10/13/22 1100  TempSrc:   PainSc: 0-No pain                 Lowella Curb

## 2022-10-13 NOTE — Transfer of Care (Signed)
Immediate Anesthesia Transfer of Care Note  Patient: Donald Larson  Procedure(s) Performed: ROBOTIC ASSISTED NAVIGATIONAL BRONCHOSCOPY (Bilateral) BRONCHIAL BIOPSIES BRONCHIAL BRUSHINGS BRONCHIAL NEEDLE ASPIRATION BIOPSIES BRONCHIAL WASHINGS FIDUCIAL MARKER PLACEMENT VIDEO BRONCHOSCOPY WITH RADIAL ENDOBRONCHIAL ULTRASOUND  Patient Location: PACU  Anesthesia Type:General  Level of Consciousness: drowsy  Airway & Oxygen Therapy: Patient Spontanous Breathing and Patient connected to face mask oxygen  Post-op Assessment: Report given to RN and Post -op Vital signs reviewed and stable  Post vital signs: Reviewed and stable  Last Vitals:  Vitals Value Taken Time  BP 153/71 10/13/22 1030  Temp 37.1 C 10/13/22 1030  Pulse 60 10/13/22 1031  Resp 17 10/13/22 1031  SpO2 99 % 10/13/22 1031  Vitals shown include unvalidated device data.  Last Pain:  Vitals:   10/13/22 0719  TempSrc:   PainSc: 0-No pain         Complications: No notable events documented.

## 2022-10-13 NOTE — Anesthesia Preprocedure Evaluation (Signed)
Anesthesia Evaluation  Patient identified by MRN, date of birth, ID band Patient awake    Reviewed: Allergy & Precautions, NPO status , Patient's Chart, lab work & pertinent test results  Airway Mallampati: II  TM Distance: >3 FB Neck ROM: Full    Dental  (+) Dental Advisory Given, Edentulous Lower, Edentulous Upper   Pulmonary COPD, Current SmokerPatient did not abstain from smoking.   Pulmonary exam normal breath sounds clear to auscultation       Cardiovascular hypertension, Pt. on medications Normal cardiovascular exam Rhythm:Regular Rate:Normal     Neuro/Psych negative neurological ROS     GI/Hepatic Neg liver ROS,GERD  ,,  Endo/Other  negative endocrine ROS    Renal/GU negative Renal ROS     Musculoskeletal  (+) Arthritis , Osteoarthritis,    Abdominal   Peds  Hematology negative hematology ROS (+)   Anesthesia Other Findings Lung nodules  Reproductive/Obstetrics                             Anesthesia Physical Anesthesia Plan  ASA: 3  Anesthesia Plan: General   Post-op Pain Management: Minimal or no pain anticipated   Induction: Intravenous  PONV Risk Score and Plan: 1 and Ondansetron and Treatment may vary due to age or medical condition  Airway Management Planned: Oral ETT  Additional Equipment:   Intra-op Plan:   Post-operative Plan: Extubation in OR  Informed Consent: I have reviewed the patients History and Physical, chart, labs and discussed the procedure including the risks, benefits and alternatives for the proposed anesthesia with the patient or authorized representative who has indicated his/her understanding and acceptance.     Dental advisory given  Plan Discussed with: CRNA  Anesthesia Plan Comments:         Anesthesia Quick Evaluation

## 2022-10-13 NOTE — Interval H&P Note (Signed)
History and Physical Interval Note:  10/13/2022 7:38 AM  Donald Larson  has presented today for surgery, with the diagnosis of BILATERAL PULMONARY NODULE.  The various methods of treatment have been discussed with the patient and family. After consideration of risks, benefits and other options for treatment, the patient has consented to  Procedure(s): ROBOTIC ASSISTED NAVIGATIONAL BRONCHOSCOPY (Bilateral) as a surgical intervention.  The patient's history has been reviewed, patient examined, no change in status, stable for surgery.  I have reviewed the patient's chart and labs.  Questions were answered to the patient's satisfaction.     Leslye Peer

## 2022-10-13 NOTE — Discharge Instructions (Signed)
Flexible Bronchoscopy, Care After This sheet gives you information about how to care for yourself after your test. Your doctor may also give you more specific instructions. If you have problems or questions, contact your doctor. Follow these instructions at home: Eating and drinking When your numbness is gone and your cough and gag reflexes have come back, you may: Eat only soft foods. Slowly drink liquids. The day after the test, go back to your normal diet. Driving Do not drive for 24 hours if you were given a medicine to help you relax (sedative). Do not drive or use heavy machinery while taking prescription pain medicine. General instructions  Take over-the-counter and prescription medicines only as told by your doctor. Return to your normal activities as told. Ask what activities are safe for you. Do not use any products that have nicotine or tobacco in them. This includes cigarettes and e-cigarettes. If you need help quitting, ask your doctor. Keep all follow-up visits as told by your doctor. This is important. It is very important if you had a tissue sample (biopsy) taken. Get help right away if: You have shortness of breath that gets worse. You get light-headed. You feel like you are going to pass out (faint). You have chest pain. You cough up: More than a little blood. More blood than before. Summary Do not eat or drink anything (not even water) for 2 hours after your test, or until your numbing medicine wears off. Do not use cigarettes. Do not use e-cigarettes. Get help right away if you have chest pain.  Please call our office for any questions or concerns.  [phone].  This information is not intended to replace advice given to you by your health care provider. Make sure you discuss any questions you have with your health care provider. Document Released: 03/23/2009 Document Revised: 05/08/2017 Document Reviewed: 06/13/2016 Elsevier Patient Education  2020 Else[employer]  Inc[employer]  

## 2022-10-13 NOTE — Anesthesia Procedure Notes (Signed)
Procedure Name: Intubation Date/Time: 10/13/2022 9:08 AM  Performed by: Caren Macadam, CRNAPre-anesthesia Checklist: Patient identified, Emergency Drugs available, Suction available and Patient being monitored Patient Re-evaluated:Patient Re-evaluated prior to induction Oxygen Delivery Method: Circle system utilized Preoxygenation: Pre-oxygenation with 100% oxygen Induction Type: IV induction Ventilation: Mask ventilation without difficulty Laryngoscope Size: Miller and 2 Grade View: Grade I Tube type: Oral Tube size: 8.5 mm Number of attempts: 1 Airway Equipment and Method: Stylet Placement Confirmation: ETT inserted through vocal cords under direct vision, positive ETCO2 and breath sounds checked- equal and bilateral Secured at: 23 cm Tube secured with: Tape Dental Injury: Teeth and Oropharynx as per pre-operative assessment

## 2022-10-13 NOTE — Op Note (Signed)
Video Bronchoscopy with Robotic Assisted Bronchoscopic Navigation   Date of Operation: 10/13/2022   Pre-op Diagnosis: Bilateral pulmonary nodules  Post-op Diagnosis: Same  Surgeon: Levy Pupa  Assistants: None  Anesthesia: General endotracheal anesthesia  Operation: Flexible video fiberoptic bronchoscopy with robotic assistance and biopsies.  Estimated Blood Loss: Minimal  Complications: None  Indications and History: Donald Larson is a 77 y.o. male with history of tobacco use.  He has had waxing and waning pulmonary nodules on serial CT scans of the chest.  Recommendation made to achieve a tissue diagnosis and obtain culture data via robotic assisted navigational bronchoscopy. The risks, benefits, complications, treatment options and expected outcomes were discussed with the patient.  The possibilities of pneumothorax, pneumonia, reaction to medication, pulmonary aspiration, perforation of a viscus, bleeding, failure to diagnose a condition and creating a complication requiring transfusion or operation were discussed with the patient who freely signed the consent.    Description of Procedure: The patient was seen in the Preoperative Area, was examined and was deemed appropriate to proceed.  The patient was taken to [facility] endoscopy room 3, identified as [name] and the procedure verified as Flexible Video Fiberoptic Bronchoscopy.  A Time Out was held and the above information confirmed.   Prior to the date of the procedure a high-resolution CT scan of the chest was performed. Utilizing ION software program a virtual tracheobronchial tree was generated to allow the creation of distinct navigation pathways to the patient's parenchymal abnormalities. After being taken to the operating room general anesthesia was initiated and the patient  was orally intubated. The video fiberoptic bronchoscope was introduced via the endotracheal tube and a general inspection was performed which showed  normal right and left lung anatomy. Aspiration of the bilateral mainstems was completed to remove any remaining secretions. Robotic catheter inserted into patient's endotracheal tube.   Target #1 left upper lobe pulmonary nodule: The distinct navigation pathways prepared prior to this procedure were then utilized to navigate to patient's lesion identified on CT scan. The robotic catheter was secured into place and the vision probe was withdrawn.  Lesion location was approximated using fluoroscopy and radial endobronchial ultrasound for peripheral targeting.  Local registration and targeting was performed using Cios three-dimensional imaging.  Under fluoroscopic guidance transbronchial needle brushings, transbronchial needle biopsies, and transbronchial forceps biopsies were performed to be sent for cytology and pathology.  Under fluoroscopic guidance a single fiducial marker was placed adjacent to the nodule.  A bronchioalveolar lavage was performed in the left upper lobe and sent for cytology and microbiology.  Target #2 right upper lobe pulmonary nodule: The distinct navigation pathways prepared prior to this procedure were then utilized to navigate to patient's lesion identified on CT scan. The robotic catheter was secured into place and the vision probe was withdrawn.  Lesion location was approximated using fluoroscopy and radial endobronchial ultrasound for peripheral targeting. Local registration and targeting was performed using Cios three-dimensional imaging. Under fluoroscopic guidance transbronchial brushings, transbronchial needle biopsies, and transbronchial forceps biopsies were performed to be sent for cytology and pathology.  Under fluoroscopic guidance a single fiducial marker was placed adjacent to the nodule.  A bronchioalveolar lavage was performed in the right upper lobe and sent for microbiology.  At the end of the procedure a general airway inspection was performed and there was no  evidence of active bleeding. The bronchoscope was removed.  The patient tolerated the procedure well. There was no significant blood loss and there were no obvious  complications. A post-procedural chest x-ray is pending.  Samples Target #1: 1. Transbronchial needle brushings from left upper lobe pulmonary nodule 2. Transbronchial Wang needle biopsies from left upper lobe pulmonary nodule 3. Transbronchial forceps biopsies from left upper lobe pulmonary nodule 4. Bronchoalveolar lavage from left upper lobe  Samples Target #2: 1. Transbronchial brushings from right upper lobe pulmonary nodule 2. Transbronchial Wang needle biopsies from right upper lobe pulmonary nodule 3. Transbronchial forceps biopsies from right upper lobe pulmonary nodule 4. Bronchoalveolar lavage from right upper lobe  Plans:  The patient will be discharged from the PACU to home when recovered from anesthesia and after chest x-ray is reviewed. We will review the cytology, pathology and microbiology results with the patient when they become available. Outpatient followup will be with Dr. Delton Coombes.    Levy Pupa, MD, PhD 10/13/2022, 10:26 AM [facility] [phone] or if no answer before 7:00PM call [phone] For any issues after 7:00PM please call eLink [phone]

## 2022-10-14 ENCOUNTER — Other Ambulatory Visit: Payer: Self-pay | Admitting: Emergency Medicine

## 2022-10-14 ENCOUNTER — Telehealth: Payer: Self-pay | Admitting: Emergency Medicine

## 2022-10-14 DIAGNOSIS — J387 Other diseases of larynx: Secondary | ICD-10-CM

## 2022-10-14 LAB — CULTURE, BAL-QUANTITATIVE W GRAM STAIN

## 2022-10-14 LAB — CYTOLOGY - NON PAP

## 2022-10-14 LAB — AEROBIC/ANAEROBIC CULTURE W GRAM STAIN (SURGICAL/DEEP WOUND)
Culture: NO GROWTH
Gram Stain: NONE SEEN

## 2022-10-14 NOTE — Telephone Encounter (Signed)
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  Called to discuss this with the patient, no answer and left VM.  Will try him again

## 2022-10-14 NOTE — Telephone Encounter (Signed)
Called and left the patient's grandson [name] a message regarding pending biopsy results.  I will try to call him back.

## 2022-10-15 LAB — CULTURE, BAL-QUANTITATIVE W GRAM STAIN
Culture: NO GROWTH
Gram Stain: NONE SEEN

## 2022-10-15 NOTE — Telephone Encounter (Signed)
I discussed the bronchoscopy results with the patient's grandson [name] by phone.  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  I have ordered his PET scan.

## 2022-10-15 NOTE — Telephone Encounter (Signed)
Attempted to call the patient to review his bronchoscopy results, no answer, left him a voicemail.  Will have to try him back

## 2022-10-16 ENCOUNTER — Encounter (HOSPITAL_COMMUNITY): Payer: Self-pay | Admitting: Emergency Medicine

## 2022-10-16 LAB — CYTOLOGY - NON PAP

## 2022-10-16 LAB — AEROBIC/ANAEROBIC CULTURE W GRAM STAIN (SURGICAL/DEEP WOUND)

## 2022-10-17 LAB — ACID FAST SMEAR (AFB, MYCOBACTERIA)
Acid Fast Smear: NEGATIVE
Acid Fast Smear: NEGATIVE

## 2022-10-17 LAB — AEROBIC/ANAEROBIC CULTURE W GRAM STAIN (SURGICAL/DEEP WOUND)

## 2022-10-17 LAB — FUNGUS CULTURE WITH STAIN

## 2022-10-18 LAB — AEROBIC/ANAEROBIC CULTURE W GRAM STAIN (SURGICAL/DEEP WOUND)
Culture: NO GROWTH
Gram Stain: NONE SEEN

## 2022-10-23 ENCOUNTER — Other Ambulatory Visit: Payer: Self-pay | Admitting: *Deleted

## 2022-10-23 NOTE — Progress Notes (Signed)
The proposed treatment discussed in conference is for discussion purpose only and is not a binding recommendation.  The patients have not been physically examined, or presented with their treatment options.  Therefore, final treatment plans cannot be decided.  

## 2022-10-28 LAB — FUNGUS CULTURE WITH STAIN

## 2022-10-28 LAB — FUNGUS CULTURE RESULT

## 2022-10-31 ENCOUNTER — Ambulatory Visit (HOSPITAL_COMMUNITY)
Admission: RE | Admit: 2022-10-31 | Discharge: 2022-10-31 | Disposition: A | Discharge status: Home / Self Care | Payer: Medicare HMO | Source: Ambulatory Visit | Attending: Emergency Medicine | Admitting: Emergency Medicine

## 2022-10-31 DIAGNOSIS — J387 Other diseases of larynx: Secondary | ICD-10-CM | POA: Insufficient documentation

## 2022-10-31 DIAGNOSIS — R918 Other nonspecific abnormal finding of lung field: Secondary | ICD-10-CM | POA: Diagnosis not present

## 2022-10-31 DIAGNOSIS — I700 Atherosclerosis of aorta: Secondary | ICD-10-CM | POA: Diagnosis not present

## 2022-10-31 LAB — GLUCOSE, CAPILLARY: Glucose-Capillary: 77 mg/dL (ref 70–99)

## 2022-10-31 MED ORDER — FLUDEOXYGLUCOSE F - 18 (FDG) INJECTION
5.8000 | Freq: Once | INTRAVENOUS | Status: AC
  Administered 2022-10-31: 6.18 via INTRAVENOUS

## 2022-11-11 ENCOUNTER — Telehealth: Payer: Self-pay | Admitting: Emergency Medicine

## 2022-11-11 NOTE — Telephone Encounter (Signed)
PT's grandson calling for PET results and more information on the biopsy. Last biopsy results were just preliminary.  Pls call @ [phone]

## 2022-11-12 LAB — FUNGAL ORGANISM REFLEX

## 2022-11-12 LAB — FUNGUS CULTURE WITH STAIN

## 2022-11-12 LAB — FUNGUS CULTURE RESULT

## 2022-11-12 NOTE — Telephone Encounter (Signed)
When your back from vacation can you please advise on PET results?

## 2022-11-14 ENCOUNTER — Telehealth: Payer: Self-pay | Admitting: Radiation Oncology

## 2022-11-14 NOTE — Telephone Encounter (Signed)
6/7 @ 11:15 am Left voicemail for patient to call our office to be schedule for consult.

## 2022-11-17 ENCOUNTER — Ambulatory Visit: Payer: Medicare Other | Admitting: Hematology and Oncology

## 2022-11-17 NOTE — Progress Notes (Deleted)
Thoracic Location of Tumor / Histology:   NM PET Image Restage (PS) Skull Base to Thigh 10/31/2022  IMPRESSION: 1. Faint metabolic activity associated with LEFT upper lobe pulmonary nodule. Findings similar comparison PET-CT scan. 2. Faint metabolic activity associated with RIGHT upper lobe pulmonary nodule. Findings similar to comparison PET-CT exam. 3. No metastatic lymphadenopathy. 4. No evidence of head neck cancer recurrence within the or oropharynx or hypopharynx.  CT Super D Chest Wo Contrast 09/26/2022  IMPRESSION: Mildly progressive left upper lobe nodule, suspicious for metastasis versus primary bronchogenic neoplasm. Additional right upper lobe nodule is grossly unchanged.   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  Patient presented with symptoms of:  Per Dr. Kavin Leech note on 10-02-22 I saw him for an abnormal CT scan of the chest with new bilateral infiltrates, nodular disease. No known history of aspiration. I treated him with Augmentin for 2 weeks and plans to reimage. He had a CT chest done 09/26/2022 as below. He saw ENT on 09/11/2022 and did not have any ulceration or mass identified on the medial or lateral surface. No biopsies performed.   Biopsies revealed:  04-03-21 FINAL MICROSCOPIC DIAGNOSIS:   A. LARYNX, RIGHT, BIOPSY:  -  Invasive squamous cell carcinoma  -  See comment   COMMENT:   Based on the biopsy the carcinoma appears moderately differentiated.  Dr. Kenard Gower reviewed the case and agrees with the above diagnosis.  Dr.  Annalee Genta was notified of these results on April 04, 2021.   FINAL MICROSCOPIC DIAGNOSIS:   A. NECK, RIGHT, FINE NEEDLE ASPIRATION:  -  Malignant cells consistent with squamous cell carcinoma   SPECIMEN ADEQUACY:  A. Satisfactory for Evaluation   10-13-22 FINAL MICROSCOPIC DIAGNOSIS:  D. LUNG, RUL TARGET #2, FINE NEEDLE ASPIRATION  BIOPSIES:  - Non-small cell carcinoma  - See comment   E.  LUNG, RUL TARGET #2, BRUSHING:  - Nondiagnostic material  - See comment   COMMENT:  Dr. Luisa Hart reviewed the case and agrees with the above diagnosis.  Dr.  Delton Coombes was notified on 10/14/2022 at 4:33 PM.   Tobacco/Marijuana/Snuff/ETOH use: history of heavy tobacco use  Smoking status: Every Day       Packs/day: 2      Types: Cigarettes      Start date: 06/09/1956   Smokeless tobacco: Never   Alcohol use: Yes       Alcohol/week: 14.0 standard drinks of alcohol      Types: 14 Standard drinks or equivalent per week      Comment: daily 2-3 mixed drinks. vodka with juices    Drug use: Yes      Types: Marijuana      Comment: smokes on occasion    Past/Anticipated interventions by cardiothoracic surgery, if any:  Video Bronchoscopy with Robotic Assisted Bronchoscopic Navigation    Date of Operation: 10/13/2022    Pre-op Diagnosis: Bilateral pulmonary nodules   Post-op Diagnosis: Same   Surgeon: Levy Pupa  Dr. Delton Coombes on 11-18-22 Called patient's grandson [name], left him a voicemail reviewing the PET scan results. No evidence of distant disease, just mild hypermetabolism in the bilateral upper lobe pulmonary nodules. The patient has follow-up arranged with radiation oncology and oncology.    Past/Anticipated interventions by medical oncology, if any:  Benjiman Core, MD 04/01/2022   Impression and Plan:   77 year old with:  1. 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   He is currently on active surveillance after achieving  a complete response to therapy.  PET scan obtained in May 2023 showed no evidence of relapsed disease.  I recommended continued surveillance at this time and intervene only if he developed relapsed disease.    2.  IV access: Port-A-Cath has been removed without any issues.   3.  Nutritional status: He is eating adequately and has gained weight at this time.   4.  Follow-up: will be as needed in the future.  He is  following with Dr. Basilio Cairo regarding his cancer.

## 2022-11-19 NOTE — Telephone Encounter (Signed)
Called patient's grandson [name], left him a voicemail reviewing the PET scan results.  No evidence of distant disease, just mild hypermetabolism in the bilateral upper lobe pulmonary nodules.  The patient has follow-up arranged with radiation oncology and oncology.

## 2022-11-27 ENCOUNTER — Telehealth: Payer: Self-pay
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
## 2022-11-27 NOTE — Telephone Encounter (Signed)
Rn called pt and attempted to get nurse evaluation information without success. Pt stated he needed his grandson to help with the information and he was not available. Nursing staff will have to obtain this information in the morning.

## 2022-11-28 ENCOUNTER — Ambulatory Visit
Admission: RE | Admit: 2022-11-28 | Discharge: 2022-11-28 | Disposition: A | Discharge status: Home / Self Care | Payer: Medicare HMO | Source: Ambulatory Visit | Attending: Radiation Oncology | Admitting: Radiation Oncology

## 2022-11-28 ENCOUNTER — Encounter: Payer: Self-pay | Admitting: Nutrition

## 2022-11-28 ENCOUNTER — Encounter: Payer: Self-pay | Admitting: Radiation Oncology

## 2022-11-28 ENCOUNTER — Ambulatory Visit: Admission: RE | Admit: 2022-11-28 | Payer: Medicare HMO | Source: Ambulatory Visit

## 2022-11-28 ENCOUNTER — Other Ambulatory Visit: Payer: Self-pay

## 2022-11-28 VITALS — BP 158/84 | HR 75 | Resp 18 | Wt 123.2 lb

## 2022-11-28 DIAGNOSIS — C3411 Malignant neoplasm of upper lobe, right bronchus or lung: Secondary | ICD-10-CM | POA: Insufficient documentation

## 2022-11-28 DIAGNOSIS — J449 Chronic obstructive pulmonary disease, unspecified: Secondary | ICD-10-CM | POA: Diagnosis not present

## 2022-11-28 DIAGNOSIS — K219 Gastro-esophageal reflux disease without esophagitis: Secondary | ICD-10-CM | POA: Diagnosis not present

## 2022-11-28 DIAGNOSIS — F17210 Nicotine dependence, cigarettes, uncomplicated: Secondary | ICD-10-CM | POA: Insufficient documentation

## 2022-11-28 DIAGNOSIS — I10 Essential (primary) hypertension: Secondary | ICD-10-CM | POA: Diagnosis not present

## 2022-11-28 DIAGNOSIS — Z800 Family history of malignant neoplasm of digestive organs: Secondary | ICD-10-CM | POA: Insufficient documentation

## 2022-11-28 DIAGNOSIS — Z79899 Other long term (current) drug therapy: Secondary | ICD-10-CM | POA: Diagnosis not present

## 2022-11-28 DIAGNOSIS — C3412 Malignant neoplasm of upper lobe, left bronchus or lung: Secondary | ICD-10-CM | POA: Diagnosis present

## 2022-11-28 DIAGNOSIS — I700 Atherosclerosis of aorta: Secondary | ICD-10-CM | POA: Insufficient documentation

## 2022-11-28 DIAGNOSIS — Z801 Family history of malignant neoplasm of trachea, bronchus and lung: Secondary | ICD-10-CM | POA: Diagnosis not present

## 2022-11-28 DIAGNOSIS — M129 Arthropathy, unspecified: Secondary | ICD-10-CM | POA: Diagnosis not present

## 2022-11-28 LAB — ACID FAST CULTURE WITH REFLEXED SENSITIVITIES (MYCOBACTERIA)
Acid Fast Culture: NEGATIVE
Acid Fast Culture: NEGATIVE

## 2022-11-28 NOTE — Progress Notes (Signed)
Thoracic Location of Tumor / Histology:   NM PET Image Restage (PS) Skull Base to Thigh 10/31/2022  IMPRESSION: 1. Faint metabolic activity associated with LEFT upper lobe pulmonary nodule. Findings similar comparison PET-CT scan. 2. Faint metabolic activity associated with RIGHT upper lobe pulmonary nodule. Findings similar to comparison PET-CT exam. 3. No metastatic lymphadenopathy. 4. No evidence of head neck cancer recurrence within the or oropharynx or hypopharynx.  CT Super D Chest Wo Contrast 09/26/2022  IMPRESSION: Mildly progressive left upper lobe nodule, suspicious for metastasis versus primary bronchogenic neoplasm. Additional right upper lobe nodule is grossly unchanged.   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  Patient presented with symptoms of:  Per Dr. Kavin Leech note on 10-02-22 I saw him for an abnormal CT scan of the chest with new bilateral infiltrates, nodular disease. No known history of aspiration. I treated him with Augmentin for 2 weeks and plans to reimage. He had a CT chest done 09/26/2022 as below. He saw ENT on 09/11/2022 and did not have any ulceration or mass identified on the medial or lateral surface. No biopsies performed.   Biopsies revealed:  04-03-21 FINAL MICROSCOPIC DIAGNOSIS:   A. LARYNX, RIGHT, BIOPSY:  -  Invasive squamous cell carcinoma  -  See comment   COMMENT:   Based on the biopsy the carcinoma appears moderately differentiated.  Dr. Kenard Gower reviewed the case and agrees with the above diagnosis.  Dr.  Annalee Genta was notified of these results on April 04, 2021.   FINAL MICROSCOPIC DIAGNOSIS:   A. NECK, RIGHT, FINE NEEDLE ASPIRATION:  -  Malignant cells consistent with squamous cell carcinoma   SPECIMEN ADEQUACY:  A. Satisfactory for Evaluation   10-13-22 FINAL MICROSCOPIC DIAGNOSIS:  D. LUNG, RUL TARGET #2, FINE NEEDLE ASPIRATION  BIOPSIES:  - Non-small cell carcinoma  - See comment   E.  LUNG, RUL TARGET #2, BRUSHING:  - Nondiagnostic material  - See comment   COMMENT:  Dr. Luisa Hart reviewed the case and agrees with the above diagnosis.  Dr.  Delton Coombes was notified on 10/14/2022 at 4:33 PM.   Tobacco/Marijuana/Snuff/ETOH use: history of heavy tobacco use  Smoking status: Every Day       Packs/day: 2      Types: Cigarettes      Start date: 06/09/1956   Smokeless tobacco: Never   Alcohol use: Yes       Alcohol/week: 14.0 standard drinks of alcohol      Types: 14 Standard drinks or equivalent per week      Comment: daily 2-3 mixed drinks. vodka with juices    Drug use: Yes      Types: Marijuana      Comment: smokes on occasion    Past/Anticipated interventions by cardiothoracic surgery, if any:  Video Bronchoscopy with Robotic Assisted Bronchoscopic Navigation    Date of Operation: 10/13/2022    Pre-op Diagnosis: Bilateral pulmonary nodules   Post-op Diagnosis: Same   Surgeon: Levy Pupa  Dr. Delton Coombes on 11-18-22 Called patient's grandson [name], left him a voicemail reviewing the PET scan results. No evidence of distant disease, just mild hypermetabolism in the bilateral upper lobe pulmonary nodules. The patient has follow-up arranged with radiation oncology and oncology.    Past/Anticipated interventions by medical oncology, if any:  Benjiman Core, MD 04/01/2022   Impression and Plan:   77 year old with:  1. 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   He is currently on active surveillance after achieving  a complete response to therapy.  PET scan obtained in May 2023 showed no evidence of relapsed disease.  I recommended continued surveillance at this time and intervene only if he developed relapsed disease.    2.  IV access: Port-A-Cath has been removed without any issues.   3.  Nutritional status: He is eating adequately and has gained weight at this time.   4.  Follow-up: will be as needed in the future.  He is  following with Dr. Basilio Cairo regarding his cancer.  Signs/Symptoms Weight changes, if any: yes ,lost about 30-40 pound in the last 6 months Respiratory complaints, if any: no Hemoptysis, if any: no Pain issues, if any:  yes,reports generalized pain.  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 no  Possible current pregnancy?no, pt male Is the patient on methotrexate? no  Current Complaints / other details:   Vitals:   11/28/22 0730  BP: (!) 158/84  Pulse: 75  Resp: 18  SpO2: 96%  Weight: 55.9 kg

## 2022-11-28 NOTE — Progress Notes (Signed)
Patient provided with one complimentary case of ensure due to one or more of the following reasons:  malnutrition, weight loss/maintenance, poor appetite AND food insecurity or financial hardship.    

## 2022-12-01 ENCOUNTER — Encounter: Payer: Self-pay | Admitting: *Deleted

## 2022-12-02 ENCOUNTER — Encounter: Payer: Self-pay | Admitting: *Deleted

## 2022-12-02 NOTE — Progress Notes (Signed)
Follow-up call made to Dr. Lucky Rathke office to check on appointment regarding issues with Donald Larson's left ear.  Appointment made for 12/16/22 at 1:10 PM.  I called his grandson, Donald Larson and left a message with appointment information and call back numbers.

## 2022-12-08 DIAGNOSIS — Z510 Encounter for antineoplastic radiation therapy: Secondary | ICD-10-CM | POA: Diagnosis present

## 2022-12-08 DIAGNOSIS — C3412 Malignant neoplasm of upper lobe, left bronchus or lung: Secondary | ICD-10-CM | POA: Insufficient documentation

## 2022-12-08 DIAGNOSIS — C3411 Malignant neoplasm of upper lobe, right bronchus or lung: Secondary | ICD-10-CM | POA: Insufficient documentation

## 2022-12-22 ENCOUNTER — Ambulatory Visit: Payer: Medicare HMO

## 2022-12-22 ENCOUNTER — Ambulatory Visit
Admission: RE | Admit: 2022-12-22 | Discharge: 2022-12-22 | Disposition: A | Discharge status: Home / Self Care | Payer: Medicare HMO | Source: Ambulatory Visit | Attending: Radiation Oncology | Admitting: Radiation Oncology

## 2022-12-22 ENCOUNTER — Other Ambulatory Visit: Payer: Self-pay

## 2022-12-22 DIAGNOSIS — Z510 Encounter for antineoplastic radiation therapy: Secondary | ICD-10-CM | POA: Diagnosis not present

## 2022-12-22 LAB — RAD ONC ARIA SESSION SUMMARY
Course Elapsed Days: 0
Plan Fractions Treated to Date: 1
Plan Fractions Treated to Date: 1
Plan Prescribed Dose Per Fraction: 12 Gy
Plan Prescribed Dose Per Fraction: 18 Gy
Plan Total Fractions Prescribed: 3
Plan Total Fractions Prescribed: 5
Plan Total Prescribed Dose: 54 Gy
Plan Total Prescribed Dose: 60 Gy
Reference Point Dosage Given to Date: 12 Gy
Reference Point Dosage Given to Date: 18 Gy
Reference Point Session Dosage Given: 12 Gy
Reference Point Session Dosage Given: 18 Gy
Session Number: 1

## 2022-12-23 ENCOUNTER — Ambulatory Visit: Payer: Medicare HMO | Admitting: Radiation Oncology

## 2022-12-24 ENCOUNTER — Other Ambulatory Visit: Payer: Self-pay

## 2022-12-24 ENCOUNTER — Inpatient Hospital Stay: Payer: Medicare HMO | Attending: Specialist

## 2022-12-24 ENCOUNTER — Ambulatory Visit
Admission: RE | Admit: 2022-12-24 | Discharge: 2022-12-24 | Disposition: A | Discharge status: Home / Self Care | Payer: Medicare HMO | Source: Ambulatory Visit | Attending: Radiation Oncology | Admitting: Radiation Oncology

## 2022-12-24 DIAGNOSIS — Z510 Encounter for antineoplastic radiation therapy: Secondary | ICD-10-CM | POA: Diagnosis not present

## 2022-12-24 LAB — RAD ONC ARIA SESSION SUMMARY
Course Elapsed Days: 2
Plan Fractions Treated to Date: 2
Plan Fractions Treated to Date: 2
Plan Prescribed Dose Per Fraction: 12 Gy
Plan Prescribed Dose Per Fraction: 18 Gy
Plan Total Fractions Prescribed: 3
Plan Total Fractions Prescribed: 5
Plan Total Prescribed Dose: 54 Gy
Plan Total Prescribed Dose: 60 Gy
Reference Point Dosage Given to Date: 24 Gy
Reference Point Dosage Given to Date: 36 Gy
Reference Point Session Dosage Given: 12 Gy
Reference Point Session Dosage Given: 18 Gy
Session Number: 2

## 2022-12-25 ENCOUNTER — Ambulatory Visit: Payer: Medicare HMO | Admitting: Radiation Oncology

## 2022-12-26 ENCOUNTER — Other Ambulatory Visit: Payer: Self-pay

## 2022-12-26 ENCOUNTER — Ambulatory Visit
Admission: RE | Admit: 2022-12-26 | Discharge: 2022-12-26 | Disposition: A | Discharge status: Home / Self Care | Payer: Medicare HMO | Source: Ambulatory Visit | Attending: Radiation Oncology | Admitting: Radiation Oncology

## 2022-12-26 ENCOUNTER — Inpatient Hospital Stay: Payer: Medicare HMO

## 2022-12-26 DIAGNOSIS — Z510 Encounter for antineoplastic radiation therapy: Secondary | ICD-10-CM | POA: Diagnosis not present

## 2022-12-26 LAB — RAD ONC ARIA SESSION SUMMARY
Course Elapsed Days: 4
Plan Fractions Treated to Date: 3
Plan Prescribed Dose Per Fraction: 12 Gy
Plan Total Fractions Prescribed: 5
Plan Total Prescribed Dose: 60 Gy
Reference Point Dosage Given to Date: 36 Gy
Reference Point Session Dosage Given: 12 Gy
Session Number: 3

## 2022-12-29 ENCOUNTER — Ambulatory Visit: Payer: Medicare HMO

## 2022-12-29 ENCOUNTER — Ambulatory Visit: Admission: RE | Admit: 2022-12-29 | Payer: Medicare HMO | Source: Ambulatory Visit | Admitting: Radiation Oncology

## 2022-12-29 ENCOUNTER — Inpatient Hospital Stay: Payer: Medicare HMO

## 2022-12-29 ENCOUNTER — Encounter: Payer: Self-pay | Admitting: Nutrition

## 2022-12-29 ENCOUNTER — Other Ambulatory Visit: Payer: Self-pay

## 2022-12-29 ENCOUNTER — Inpatient Hospital Stay: Payer: Medicare HMO | Admitting: Nutrition

## 2022-12-29 DIAGNOSIS — Z510 Encounter for antineoplastic radiation therapy: Secondary | ICD-10-CM | POA: Diagnosis not present

## 2022-12-29 LAB — RAD ONC ARIA SESSION SUMMARY
Course Elapsed Days: 7
Plan Fractions Treated to Date: 3
Plan Fractions Treated to Date: 4
Plan Prescribed Dose Per Fraction: 12 Gy
Plan Prescribed Dose Per Fraction: 18 Gy
Plan Total Fractions Prescribed: 3
Plan Total Fractions Prescribed: 5
Plan Total Prescribed Dose: 54 Gy
Plan Total Prescribed Dose: 60 Gy
Reference Point Dosage Given to Date: 48 Gy
Reference Point Dosage Given to Date: 54 Gy
Reference Point Session Dosage Given: 12 Gy
Reference Point Session Dosage Given: 18 Gy
Session Number: 4

## 2022-12-29 NOTE — Progress Notes (Signed)
Patient did not come to nutrition appointment.

## 2022-12-30 ENCOUNTER — Ambulatory Visit: Admission: RE | Admit: 2022-12-30 | Payer: Medicare HMO | Source: Ambulatory Visit | Admitting: Radiation Oncology

## 2022-12-30 ENCOUNTER — Inpatient Hospital Stay: Payer: Medicare HMO

## 2022-12-30 ENCOUNTER — Other Ambulatory Visit: Payer: Self-pay

## 2022-12-30 ENCOUNTER — Ambulatory Visit
Admission: RE | Admit: 2022-12-30 | Discharge: 2022-12-30 | Disposition: A | Discharge status: Home / Self Care | Payer: Medicare HMO | Source: Ambulatory Visit | Attending: Radiation Oncology | Admitting: Radiation Oncology

## 2022-12-30 ENCOUNTER — Ambulatory Visit: Payer: Medicare HMO

## 2022-12-30 ENCOUNTER — Ambulatory Visit: Payer: Medicare HMO | Admitting: Radiation Oncology

## 2022-12-30 ENCOUNTER — Ambulatory Visit: Payer: Self-pay | Admitting: Radiation Oncology

## 2022-12-30 DIAGNOSIS — C321 Malignant neoplasm of supraglottis: Secondary | ICD-10-CM

## 2022-12-30 DIAGNOSIS — Z510 Encounter for antineoplastic radiation therapy: Secondary | ICD-10-CM | POA: Diagnosis not present

## 2022-12-30 LAB — RAD ONC ARIA SESSION SUMMARY
Course Elapsed Days: 8
Plan Fractions Treated to Date: 5
Plan Prescribed Dose Per Fraction: 12 Gy
Plan Total Fractions Prescribed: 5
Plan Total Prescribed Dose: 60 Gy
Reference Point Dosage Given to Date: 60 Gy
Reference Point Session Dosage Given: 12 Gy
Session Number: 5

## 2022-12-30 NOTE — Progress Notes (Signed)
Ct neck and ct chest with contrast ordered at request of Dr. Basilio Cairo.

## 2022-12-31 NOTE — Radiation Completion Notes (Signed)
Patient Name: Donald Larson, Donald Larson MRN: [id number] Date of Birth: 1945-09-14 Referring Physician: Levy Pupa, M.D. Date of Service: 2022-12-31 Radiation Oncologist: Lonie Peak, M.D. [facility]                             RADIATION ONCOLOGY END OF TREATMENT NOTE     Diagnosis: C34.12 Malignant neoplasm of upper lobe, left bronchus or lung; C34.11 Malignant neoplasm of upper lobe, right bronchus or lung Staging on 2021-04-30: Cancer of supraglottis (HCC) T=cT1, N=cN2a, M=cM0 Intent: Curative     ==========DELIVERED PLANS==========  First Treatment Date: 2022-12-22 - Last Treatment Date: 2022-12-30   Plan Name: Lung_L_SBRT Site: Lung, Left Technique: SBRT/SRT-IMRT Mode: Photon Dose Per Fraction: 12 Gy Prescribed Dose (Delivered / Prescribed): 60 Gy / 60 Gy Prescribed Fxs (Delivered / Prescribed): 5 / 5   Plan Name: Lung_R_SBRT Site: Lung, Right Technique: SBRT/SRT-IMRT Mode: Photon Dose Per Fraction: 18 Gy Prescribed Dose (Delivered / Prescribed): 54 Gy / 54 Gy Prescribed Fxs (Delivered / Prescribed): 3 / 3     ==========ON TREATMENT VISIT DATES========== 2022-12-22, 2022-12-22, 2022-12-24, 2022-12-24, 2022-12-26, 2022-12-29, 2022-12-29, 2022-12-30, 2022-12-30     ==========UPCOMING VISITS==========       ==========APPENDIX - ON TREATMENT VISIT NOTES==========   See weekly On Treatment Notes in Epic for details.

## 2023-01-27 ENCOUNTER — Telehealth: Payer: Self-pay | Admitting: *Deleted

## 2023-01-27 ENCOUNTER — Telehealth: Payer: Self-pay | Admitting: Emergency Medicine

## 2023-01-27 NOTE — Telephone Encounter (Signed)
Called patient to inform of CT for 03-31-23- arrival time- 12:45 pm @ [facility], patient to have water only - 4 hrs. prior to test, patient to receive results from Dr. Basilio Cairo on 04-03-23 @  2:20 pm, spoke with patient and he is aware of these appts. and the instructions

## 2023-01-27 NOTE — Telephone Encounter (Signed)
Patient's grandson is calling stating that Donald Larson is experiencing a bad cough and phlem build up. He was wondering if a prescription could be called in to assist with this. Please call and advise his grandson at [phone] [name]

## 2023-01-29 ENCOUNTER — Telehealth: Payer: Self-pay

## 2023-01-29 ENCOUNTER — Ambulatory Visit: Payer: Medicare HMO | Admitting: Radiation Oncology

## 2023-01-29 NOTE — Telephone Encounter (Signed)
Rn left messages with pt and with grandson. Rn left call back information with these messages. This was an attempt to reach pt for telephone follow up.

## 2023-01-29 NOTE — Telephone Encounter (Signed)
Rn left message for pt after failed attempt to reach pt for telephone follow up. Rn left call back information with this message. Rn will attempt to call back later as well.

## 2023-01-30 ENCOUNTER — Telehealth: Payer: Self-pay

## 2023-01-30 ENCOUNTER — Ambulatory Visit: Payer: Self-pay | Admitting: Radiation Oncology

## 2023-01-30 NOTE — Telephone Encounter (Signed)
RN called pt and pt grandson after not being able to reach them yesterday. Rn was able to contact both of them today. According to pt grandson pt has been declining in his health overall and has lost a considerable amount of weight. He weighs about 120 lbs now and is continuing to lose weight.  His family reports he eats and drinks, but only eats small amounts at a time. They are also using ensures for pt as well. The family reports he is more forgetful as well. He also loses his balance more and has fallen at home. Family states he tells them he is very tired and feels like his cancer remains. Family also reports he continues to be depressed due to losing his sister last year and having to move in with his grandson (feels like he lost his independence). Finally the family noted that the pt has a gurgling sound coming from his throat/ chest area when he lays down to sleep especially. He will have to wake up to try to clear the congested feeling, but due to a weak cough he cannot. Overall the pt grandson has seen a decline in his grandfather and is very concerned. He did contact Dr. Kavin Leech office a couple of days ago to see about getting another scan to see what may be going on in his lungs. Pt grandson also told Rn he has an appointment with his PCP (Dr. Mayford Knife) early next month. Rn encouraged pt and family to have an honest conversation about everything going on and what actions pt would even want taken (ex.hospice,etc.). Rn will give this information to Dr. Basilio Cairo to see if she would like to move up his scans and appointments for [place] as well. Rn will reach out to family with this feedback.

## 2023-01-30 NOTE — Telephone Encounter (Signed)
Rn called pt grandson to let him know we still start working on moving up scans and follow up with Dr. Basilio Cairo in response to pt current condition. We will also get palliative involved as well. Pt grandson was grateful for the phone call and actions taken. Rn will reach out to family next week.

## 2023-02-01 ENCOUNTER — Emergency Department (HOSPITAL_COMMUNITY): Payer: Medicare HMO

## 2023-02-01 ENCOUNTER — Observation Stay (HOSPITAL_COMMUNITY)
Admission: EM | Admit: 2023-02-01 | Discharge: 2023-02-02 | Disposition: A | Discharge status: Home / Self Care | Payer: Medicare HMO | Attending: Internal Medicine | Admitting: Internal Medicine

## 2023-02-01 DIAGNOSIS — R410 Disorientation, unspecified: Secondary | ICD-10-CM | POA: Diagnosis not present

## 2023-02-01 DIAGNOSIS — E876 Hypokalemia: Secondary | ICD-10-CM | POA: Insufficient documentation

## 2023-02-01 DIAGNOSIS — F17210 Nicotine dependence, cigarettes, uncomplicated: Secondary | ICD-10-CM | POA: Insufficient documentation

## 2023-02-01 DIAGNOSIS — Z85819 Personal history of malignant neoplasm of unspecified site of lip, oral cavity, and pharynx: Secondary | ICD-10-CM | POA: Diagnosis not present

## 2023-02-01 DIAGNOSIS — F17200 Nicotine dependence, unspecified, uncomplicated: Secondary | ICD-10-CM | POA: Diagnosis not present

## 2023-02-01 DIAGNOSIS — G9340 Encephalopathy, unspecified: Secondary | ICD-10-CM | POA: Diagnosis not present

## 2023-02-01 DIAGNOSIS — R4182 Altered mental status, unspecified: Secondary | ICD-10-CM | POA: Diagnosis present

## 2023-02-01 DIAGNOSIS — J449 Chronic obstructive pulmonary disease, unspecified: Secondary | ICD-10-CM | POA: Insufficient documentation

## 2023-02-01 DIAGNOSIS — D696 Thrombocytopenia, unspecified: Secondary | ICD-10-CM | POA: Insufficient documentation

## 2023-02-01 DIAGNOSIS — I10 Essential (primary) hypertension: Secondary | ICD-10-CM | POA: Insufficient documentation

## 2023-02-01 DIAGNOSIS — Z85818 Personal history of malignant neoplasm of other sites of lip, oral cavity, and pharynx: Secondary | ICD-10-CM | POA: Diagnosis not present

## 2023-02-01 DIAGNOSIS — Z79899 Other long term (current) drug therapy: Secondary | ICD-10-CM | POA: Insufficient documentation

## 2023-02-01 DIAGNOSIS — C3411 Malignant neoplasm of upper lobe, right bronchus or lung: Secondary | ICD-10-CM | POA: Diagnosis not present

## 2023-02-01 LAB — I-STAT CHEM 8, ED
BUN: 17 mg/dL (ref 8–23)
Calcium, Ion: 1 mmol/L — ABNORMAL LOW (ref 1.15–1.40)
Chloride: 107 mmol/L (ref 98–111)
Creatinine, Ser: 1 mg/dL (ref 0.61–1.24)
Glucose, Bld: 85 mg/dL (ref 70–99)
HCT: 38 % — ABNORMAL LOW (ref 39.0–52.0)
Hemoglobin: 12.9 g/dL — ABNORMAL LOW (ref 13.0–17.0)
Potassium: 3.9 mmol/L (ref 3.5–5.1)
Sodium: 139 mmol/L (ref 135–145)
TCO2: 22 mmol/L (ref 22–32)

## 2023-02-01 LAB — COMPREHENSIVE METABOLIC PANEL
ALT: 9 U/L (ref 0–44)
AST: 17 U/L (ref 15–41)
Albumin: 3.4 g/dL — ABNORMAL LOW (ref 3.5–5.0)
Alkaline Phosphatase: 56 U/L (ref 38–126)
Anion gap: 14 (ref 5–15)
BUN: 17 mg/dL (ref 8–23)
CO2: 23 mmol/L (ref 22–32)
Calcium: 8.6 mg/dL — ABNORMAL LOW (ref 8.9–10.3)
Chloride: 103 mmol/L (ref 98–111)
Creatinine, Ser: 0.96 mg/dL (ref 0.61–1.24)
GFR, Estimated: >60 mL/min (ref >60)
Glucose, Bld: 90 mg/dL (ref 70–99)
Potassium: 4 mmol/L (ref 3.5–5.1)
Sodium: 140 mmol/L (ref 135–145)
Total Bilirubin: 0.5 mg/dL (ref 0.3–1.2)
Total Protein: 5.7 g/dL — ABNORMAL LOW (ref 6.5–8.1)

## 2023-02-01 LAB — CBG MONITORING, ED: Glucose-Capillary: 88 mg/dL (ref 70–99)

## 2023-02-01 LAB — CBC WITH DIFFERENTIAL/PLATELET
Abs Immature Granulocytes: 0.01 10*3/uL (ref 0.00–0.07)
Basophils Absolute: 0 10*3/uL (ref 0.0–0.1)
Basophils Relative: 1 %
Eosinophils Absolute: 0.3 10*3/uL (ref 0.0–0.5)
Eosinophils Relative: 5 %
HCT: 38.3 % — ABNORMAL LOW (ref 39.0–52.0)
Hemoglobin: 12.7 g/dL — ABNORMAL LOW (ref 13.0–17.0)
Immature Granulocytes: 0 %
Lymphocytes Relative: 39 %
Lymphs Abs: 2.2 10*3/uL (ref 0.7–4.0)
MCH: 32.4 pg (ref 26.0–34.0)
MCHC: 33.2 g/dL (ref 30.0–36.0)
MCV: 97.7 fL (ref 80.0–100.0)
Monocytes Absolute: 0.5 10*3/uL (ref 0.1–1.0)
Monocytes Relative: 10 %
Neutro Abs: 2.5 10*3/uL (ref 1.7–7.7)
Neutrophils Relative %: 45 %
Platelets: 129 10*3/uL — ABNORMAL LOW (ref 150–400)
RBC: 3.92 MIL/uL — ABNORMAL LOW (ref 4.22–5.81)
RDW: 13.8 % (ref 11.5–15.5)
WBC: 5.5 10*3/uL (ref 4.0–10.5)
nRBC: 0 % (ref 0.0–0.2)

## 2023-02-01 LAB — ETHANOL: Alcohol, Ethyl (B): <10 mg/dL (ref <10)

## 2023-02-01 LAB — PROTIME-INR
INR: 1 (ref 0.8–1.2)
Prothrombin Time: 13.8 seconds (ref 11.4–15.2)

## 2023-02-01 LAB — APTT: aPTT: 28 seconds (ref 24–36)

## 2023-02-01 MED ORDER — LACTATED RINGERS IV SOLN: INTRAVENOUS | Status: DC

## 2023-02-01 NOTE — ED Provider Notes (Signed)
[name] EMERGENCY DEPARTMENT AT [facility] Provider Note   CSN: [id number] Arrival date & time: 02/01/23  1832     History  Chief Complaint  Patient presents with   stroke like symptoms     Donald Larson is a 77 y.o. male.  77 year old male presents with family due to altered status x 2 days.  Patient has history of throat cancer.  Family states patient has had some expressive aphasia.  He is somewhat able to follow commands.  Has had decreased oral intake for several weeks but worse today.  Does seem to have some choking episodes.  No reported fever.  Has been stumbling more.  At baseline, patient is ANO x 4.  No recent history of falls.  Called EMS and blood sugar was 97 and transported here       Home Medications Prior to Admission medications   Medication Sig Start Date End Date Taking? Authorizing Provider  amLODipine (NORVASC) 10 MG tablet Take 10 mg by mouth daily. Not taking Patient not taking: Reported on 11/28/2022    [provider]  Aspirin-Caffeine (BC FAST PAIN RELIEF PO) Take 2 packets by mouth daily as needed (pain). Patient not taking: Reported on 11/28/2022    [provider]  lidocaine (XYLOCAINE) 2 % solution Patient: Mix 1part 2% viscous lidocaine, 1part H20. Swish & swallow 10mL of diluted mixture, before meals and at bedtime, up to QID for soreness. Patient not taking: Reported on 11/28/2022 05/27/21   Lonie Peak, MD  omeprazole (PRILOSEC OTC) 20 MG tablet Take 20 mg by mouth See admin instructions. Take 20 mg daily, may take a second 20 mg dose as needed for acid reflux Patient not taking: Reported on 11/28/2022    [provider]  omeprazole (PRILOSEC) 20 MG capsule Take 20 mg by mouth daily.    [provider]  Oxycodone HCl 10 MG TABS Take 10 mg by mouth 2 (two) times daily. 04/21/21   [provider]      Allergies    Patient has no known allergies.    Review of Systems   Review of  Systems  All other systems reviewed and are negative.   Physical Exam Updated Vital Signs BP (!) 164/84 (BP Location: Right Arm)   Pulse 70   Temp 97.9 F (36.6 C)   Resp 12   SpO2 100%  Physical Exam Vitals and nursing note reviewed.  Constitutional:      General: He is not in acute distress.    Appearance: Normal appearance. He is well-developed. He is not toxic-appearing.  HENT:     Head: Normocephalic and atraumatic.  Eyes:     General: Lids are normal.     Conjunctiva/sclera: Conjunctivae normal.     Pupils: Pupils are equal, round, and reactive to light.  Neck:     Thyroid: No thyroid mass.     Trachea: No tracheal deviation.  Cardiovascular:     Rate and Rhythm: Normal rate and regular rhythm.     Heart sounds: Normal heart sounds. No murmur heard.    No gallop.  Pulmonary:     Effort: Pulmonary effort is normal. No respiratory distress.     Breath sounds: Normal breath sounds. No stridor. No decreased breath sounds, wheezing, rhonchi or rales.  Abdominal:     General: There is no distension.     Palpations: Abdomen is soft.     Tenderness: There is no abdominal tenderness. There is no  rebound.  Musculoskeletal:        General: No tenderness. Normal range of motion.     Cervical back: Normal range of motion and neck supple.  Skin:    General: Skin is warm and dry.     Findings: No abrasion or rash.  Neurological:     Mental Status: He is alert. Mental status is at baseline. He is disoriented.     GCS: GCS eye subscore is 4. GCS verbal subscore is 5. GCS motor subscore is 6.     Cranial Nerves: Cranial nerves are intact. No cranial nerve deficit.     Sensory: No sensory deficit.     Motor: Motor function is intact. No tremor.     Comments: Expressive aphasia noted Strength is 5 of 5 in upper and lower extremities  Psychiatric:        Attention and Perception: He is inattentive.        Speech: Speech is tangential.     ED Results / Procedures / Treatments    Labs (all labs ordered are listed, but only abnormal results are displayed) Labs Reviewed  CBC WITH DIFFERENTIAL/PLATELET  COMPREHENSIVE METABOLIC PANEL  URINALYSIS, ROUTINE W REFLEX MICROSCOPIC  ETHANOL  PROTIME-INR  APTT  DIFFERENTIAL  RAPID URINE DRUG SCREEN, HOSP PERFORMED  I-STAT CHEM 8, ED    EKG EKG Interpretation Date/Time:  Sunday February 01 2023 18:41:32 EDT Ventricular Rate:  66 PR Interval:  174 QRS Duration:  134 QT Interval:  421 QTC Calculation: 442 R Axis:   86  Text Interpretation: Sinus rhythm Right bundle branch block No significant change since last tracing Confirmed by Lorre Nick ([id number]) on 02/01/2023 7:19:00 PM  Radiology No results found.  Procedures Procedures    Medications Ordered in ED Medications  lactated ringers infusion (has no administration in time range)    ED Course/ Medical Decision Making/ A&P                                 Medical Decision Making Amount and/or Complexity of Data Reviewed Labs: ordered. Radiology: ordered.  Risk Prescription drug management.   Patient here with altered mental status.  Concern for possible CVA.  CT head without acute findings per interpretation.  Subsequently had MRI of his brain.  That also 2 did not show any clear etiology.  No new medications.  No concern for meningitis.  Possible TIAs.  Will need to be admitted for encephalopathy workup        Final Clinical Impression(s) / ED Diagnoses Final diagnoses:  None    Rx / DC Orders ED Discharge Orders     None         Lorre Nick, MD 02/01/23 2339

## 2023-02-01 NOTE — ED Triage Notes (Signed)
Pt BIB [facility] from home d/t stroke like symptoms that started 2 days ago. Pt lives with his family. Per family, pt's baseline is A&O X4. Normal speech. Pt was  not able to light his cigaret or hold his spoon which is abnormal for him. Pt also has aphasia.   160/76 64 HR  20 RR  97% RA  97 CBG
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
## 2023-02-01 NOTE — ED Notes (Signed)
Patient transported to CT 

## 2023-02-02 ENCOUNTER — Other Ambulatory Visit: Payer: Self-pay

## 2023-02-02 ENCOUNTER — Observation Stay (HOSPITAL_COMMUNITY): Payer: Medicare HMO

## 2023-02-02 ENCOUNTER — Encounter (HOSPITAL_COMMUNITY): Payer: Self-pay | Admitting: Family Medicine

## 2023-02-02 DIAGNOSIS — R4182 Altered mental status, unspecified: Secondary | ICD-10-CM | POA: Diagnosis not present

## 2023-02-02 DIAGNOSIS — C3411 Malignant neoplasm of upper lobe, right bronchus or lung: Secondary | ICD-10-CM | POA: Diagnosis not present

## 2023-02-02 DIAGNOSIS — Z85819 Personal history of malignant neoplasm of unspecified site of lip, oral cavity, and pharynx: Secondary | ICD-10-CM | POA: Diagnosis not present

## 2023-02-02 DIAGNOSIS — Z515 Encounter for palliative care: Secondary | ICD-10-CM

## 2023-02-02 DIAGNOSIS — Z7189 Other specified counseling: Secondary | ICD-10-CM

## 2023-02-02 LAB — CBC
HCT: 36.8 % — ABNORMAL LOW (ref 39.0–52.0)
Hemoglobin: 12.6 g/dL — ABNORMAL LOW (ref 13.0–17.0)
MCH: 32.6 pg (ref 26.0–34.0)
MCHC: 34.2 g/dL (ref 30.0–36.0)
MCV: 95.3 fL (ref 80.0–100.0)
Platelets: 124 10*3/uL — ABNORMAL LOW (ref 150–400)
RBC: 3.86 MIL/uL — ABNORMAL LOW (ref 4.22–5.81)
RDW: 14 % (ref 11.5–15.5)
WBC: 4.9 10*3/uL (ref 4.0–10.5)
nRBC: 0 % (ref 0.0–0.2)

## 2023-02-02 LAB — URINALYSIS, ROUTINE W REFLEX MICROSCOPIC
Bilirubin Urine: NEGATIVE
Glucose, UA: NEGATIVE mg/dL
Hgb urine dipstick: NEGATIVE
Ketones, ur: NEGATIVE mg/dL
Leukocytes,Ua: NEGATIVE
Nitrite: NEGATIVE
Protein, ur: NEGATIVE mg/dL
Specific Gravity, Urine: 1.024 (ref 1.005–1.030)
pH: 5 (ref 5.0–8.0)

## 2023-02-02 LAB — BASIC METABOLIC PANEL
Anion gap: 12 (ref 5–15)
BUN: 13 mg/dL (ref 8–23)
CO2: 23 mmol/L (ref 22–32)
Calcium: 8.3 mg/dL — ABNORMAL LOW (ref 8.9–10.3)
Chloride: 103 mmol/L (ref 98–111)
Creatinine, Ser: 0.85 mg/dL (ref 0.61–1.24)
GFR, Estimated: >60 mL/min (ref >60)
Glucose, Bld: 84 mg/dL (ref 70–99)
Potassium: 3.4 mmol/L — ABNORMAL LOW (ref 3.5–5.1)
Sodium: 138 mmol/L (ref 135–145)

## 2023-02-02 LAB — RAPID URINE DRUG SCREEN, HOSP PERFORMED
Amphetamines: NOT DETECTED
Barbiturates: NOT DETECTED
Benzodiazepines: NOT DETECTED
Cocaine: NOT DETECTED
Opiates: NOT DETECTED
Tetrahydrocannabinol: NOT DETECTED

## 2023-02-02 MED ORDER — POTASSIUM CHLORIDE CRYS ER 20 MEQ PO TBCR
20.0000 meq | EXTENDED_RELEASE_TABLET | Freq: Once | ORAL | Status: DC
  Filled 2023-02-02: qty 1

## 2023-02-02 MED ORDER — ENOXAPARIN SODIUM 40 MG/0.4ML IJ SOSY
40.0000 mg | PREFILLED_SYRINGE | Freq: Every day | INTRAMUSCULAR | Status: DC
  Filled 2023-02-02: qty 0.4

## 2023-02-02 NOTE — Evaluation (Signed)
Occupational Therapy Evaluation Patient Details Name: Donald Larson MRN: [id number] DOB: 10-19-1945 Today's Date: 02/02/2023   History of Present Illness Donald Larson 77 y.o. male who presented with AMS x2 days. CT demonstrated  Severe chronic small vessel ischemic disease with age  indeterminate lacunar infarcts in the deep gray nuclei. 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 He reports being mod I with a few falls and lives with his grandson who assists as needed at baseline. Upon evaluation the pt was limited by unsteady gait, decreased activity tolerance and limited insight. Overall he needed up to min A for mobility without AD due to LOBs. Due to the deficits listed below the pt also needs up to min A for LB and standing ADLs. Pt will benefit from continued acute OT services and support of family as pt is not interested in follow up therapy.        If plan is discharge home, recommend the following: A little help with walking and/or transfers;A little help with bathing/dressing/bathroom;Assistance with cooking/housework;Direct supervision/assist for medications management;Direct supervision/assist for financial management;Assist for transportation;Help with stairs or ramp for entrance    Functional Status Assessment  Patient has had a recent decline in their functional status and demonstrates the ability to make significant improvements in function in a reasonable and predictable amount of time.  Equipment Recommendations  Other (comment);Tub/shower seat (RW)       Precautions / Restrictions Precautions Precautions: Fall Restrictions Weight Bearing Restrictions: No      Mobility Bed Mobility Overal bed mobility: Needs Assistance Bed Mobility: Supine to Sit, Sit to Supine      Supine to sit: Supervision Sit to supine: Supervision        Transfers Overall transfer level: Needs assistance Equipment used: None Transfers: Sit to/from Stand Sit to Stand: Contact guard assist                  Balance Overall balance assessment: Needs assistance Sitting-balance support: Feet supported Sitting balance-Leahy Scale: Good     Standing balance support: No upper extremity supported, During functional activity Standing balance-Leahy Scale: Poor Standing balance comment: multiple LOBs                           ADL either performed or assessed with clinical judgement   ADL Overall ADL's : Needs assistance/impaired Eating/Feeding: NPO   Grooming: Contact guard assist;Standing   Upper Body Bathing: Set up;Sitting   Lower Body Bathing: Minimal assistance;Sit to/from stand   Upper Body Dressing : Set up;Sitting   Lower Body Dressing: Minimal assistance;Sit to/from stand   Toilet Transfer: Minimal assistance;Ambulation Toilet Transfer Details (indicate cue type and reason): stood to urinate Toileting- Clothing Manipulation and Hygiene: Contact guard assist;Sit to/from stand       Functional mobility during ADLs: Minimal assistance General ADL Comments: min A for all standing tasks for balance, pt declined use of Rw     Vision Baseline Vision/History: 0 No visual deficits Vision Assessment?: No apparent visual deficits     Perception Perception: Not tested       Praxis Praxis: Not tested       Pertinent Vitals/Pain Pain Assessment Pain Assessment: No/denies pain     Extremity/Trunk Assessment Upper Extremity Assessment Upper Extremity Assessment: Generalized weakness (pt declines  MMT, ROM and coordination testing)   Lower Extremity Assessment Lower Extremity Assessment: Defer to PT evaluation   Cervical / Trunk Assessment Cervical / Trunk Assessment: Kyphotic   Communication Communication Communication: No apparent  difficulties   Cognition Arousal: Alert Behavior During Therapy: Impulsive Overall Cognitive Status: No family/caregiver present to determine baseline cognitive functioning                                 General Comments: pt attempting to get OOB upon arrival with bed alarm sounding, declining to use RW despite unsteady gait. Adamantly asking to go home. Declining much of the OT evaluation     General Comments  VSS     Home Living Family/patient expects to be discharged to:: Private residence Living Arrangements: Other relatives Available Help at Discharge: Family;Available PRN/intermittently Type of Home: House Home Access: Stairs to enter [employer] of Steps: 2 Entrance Stairs-Rails: Right;Left Home Layout: One level     Bathroom Shower/Tub: Tub/shower unit;Walk-in shower   Bathroom Toilet: Standard     Home Equipment: None          Prior Functioning/Environment Prior Level of Function : Independent/Modified Independent             Mobility Comments: no AD, reports recent falls but attributes them to drinking ADLs Comments: indep BADLs, family assists with IADLs        OT Problem List: Decreased strength;Decreased range of motion;Impaired balance (sitting and/or standing);Decreased activity tolerance;Decreased cognition;Decreased safety awareness;Decreased knowledge of use of DME or AE;Decreased knowledge of precautions      OT Treatment/Interventions: Self-care/ADL training;Neuromuscular education;DME and/or AE instruction;Therapeutic activities;Patient/family education;Balance training    OT Goals(Current goals can be found in the care plan section) Acute Rehab OT Goals Patient Stated Goal: home today OT Goal Formulation: With patient Time For Goal Achievement: 02/16/23 Potential to Achieve Goals: Good ADL Goals Pt Will Perform Lower Body Dressing: with modified independence;sit to/from stand Pt Will Transfer to Toilet: with  modified independence;ambulating Additional ADL Goal #1: Pt will indep navigate hospital environment without LOB given LR DME  OT Frequency: Min 1X/week       AM-PAC OT "6 Clicks" Daily Activity     Outcome Measure Help from another person eating meals?: Total (NPO) Help from another person taking care of personal grooming?: A Little Help from another person toileting, which includes using toliet, bedpan, or urinal?: A Little Help from another person bathing (including washing, rinsing, drying)?: A Little Help from another person to put on and taking off regular upper body clothing?: A Little Help from another person to put on and taking off regular lower body clothing?: A Little 6 Click Score: 16   End of Session Nurse Communication: Mobility status  Activity Tolerance: Patient tolerated treatment well Patient left: in bed;with call bell/phone within reach;with bed alarm set  OT Visit Diagnosis: Unsteadiness on feet (R26.81);Other abnormalities of gait and mobility (R26.89);Repeated falls (R29.6);Muscle weakness (generalized) (M62.81);History of falling (Z91.81)                Time: 3016-0109 OT Time Calculation (min): 19 min Charges:  OT General Charges $OT Visit: 1 Visit OT Evaluation $OT Eval Moderate Complexity: 1 Mod  Derenda Mis, OTR/L Acute Rehabilitation Services Office [phone] Secure Chat Communication Preferred   Donia Pounds 02/02/2023, 11:53 AM

## 2023-02-02 NOTE — Evaluation (Signed)
Physical Therapy Evaluation  Patient Details Name: Donald Larson MRN: [id number] DOB: 04/08/1946 Today's Date: 02/02/2023  History of Present Illness  Donald Larson 77 y.o. male who presented with AMS x2 days. CT demonstrated  Severe chronic small vessel ischemic disease with age  indeterminate lacunar infarcts in the deep gray nuclei. 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 Pt currently with functional limitations due to the deficits listed below (see PT Problem List). At the time of PT eval pt was able to perform transfers and ambulation with gross CGA and no AD. Pt's family present during session and reports he is near baseline of function. Pt eager to d/c today. Discussed functional deficits seen during evaluation with pt and family however pt declining any follow up therapies. Acutely, pt will benefit from acute skilled PT to increase their independence and safety with mobility to allow discharge.           If plan is discharge home, recommend the following: A little help with walking and/or transfers;Assistance with cooking/housework;Assist for transportation;Help with stairs or ramp for entrance   Can travel by private vehicle        Equipment Recommendations None recommended by PT  Recommendations for Other Services       Functional Status Assessment Patient has had a recent decline in their functional status and demonstrates the ability to make significant improvements in function in a reasonable and predictable amount of time.     Precautions / Restrictions Precautions Precautions: Fall Restrictions Weight Bearing Restrictions: No      Mobility  Bed Mobility Overal bed mobility: Modified Independent Bed Mobility: Supine to Sit           General bed mobility comments: Increased time  but no assist required. Pt able to manage linen and lines without assist.    Transfers Overall transfer level: Needs assistance Equipment used: None Transfers: Sit to/from Stand Sit to Stand: Contact guard assist           General transfer comment: No overt LOB but trunk flexed and close guard provided for safety.    Ambulation/Gait Ambulation/Gait assistance: Contact guard assist Gait Distance (Feet): 200 Feet Assistive device: None Gait Pattern/deviations: Step-through pattern, Decreased stride length, Trunk flexed Gait velocity: Decreased Gait velocity interpretation: <1.8 ft/sec, indicate of risk for recurrent falls   General Gait Details: Pt appears mildly unsteady at times but no overt LOB noted. Pt able to recover without assist with every bout of imbalance. No AD utilized.  Stairs Stairs: Yes Stairs assistance: Contact guard assist Stair Management: Two rails, Alternating pattern, Forwards Number of Stairs: 5 General stair comments: Practice stairs in the rehab gym. No LOB noted. Close guard provided for safety.  Wheelchair Mobility     Tilt Bed    Modified Rankin (Stroke Patients Only)       Balance Overall balance assessment: Needs assistance Sitting-balance support: Feet supported Sitting balance-Leahy Scale: Good     Standing balance support: No upper extremity supported, During functional activity Standing balance-Leahy Scale: Poor                               Pertinent Vitals/Pain Pain Assessment Pain Assessment: No/denies pain    Home Living Family/patient expects to be discharged to:: Private residence Living Arrangements: Other  relatives Available Help at Discharge: Family;Available PRN/intermittently Type of Home: House Home Access: Stairs to enter Entrance Stairs-Rails: Right;Left Entrance Stairs-Number of Steps: 2   Home Layout: One level Home Equipment: Agricultural consultant (2 wheels);Cane - single point      Prior  Function Prior Level of Function : Independent/Modified Independent             Mobility Comments: no AD, reports recent falls but attributes them to drinking ADLs Comments: indep BADLs, family assists with IADLs     Extremity/Trunk Assessment   Upper Extremity Assessment Upper Extremity Assessment: Overall WFL for tasks assessed (4+/5 strength in B shoulder flexors, biceps/triceps. Strong grip bilaterally. Denies any sensation changes.)    Lower Extremity Assessment Lower Extremity Assessment: RLE deficits/detail RLE Deficits / Details: With MMT, muscle groups are strong but muscular endurance is decreased with prolonged activity. Grossly, quads, hamstrings, ankle DF 4+/5. hip flexors 4/5. Pt denies sensation changes bilaterally    Cervical / Trunk Assessment Cervical / Trunk Assessment: Kyphotic  Communication   Communication Communication: No apparent difficulties  Cognition Arousal: Alert Behavior During Therapy: Impulsive Overall Cognitive Status: Impaired/Different from baseline Area of Impairment: Orientation, Following commands, Safety/judgement, Awareness, Problem solving                 Orientation Level: Disoriented to, Situation     Following Commands: Follows one step commands consistently, Follows multi-step commands inconsistently Safety/Judgement: Decreased awareness of deficits, Decreased awareness of safety Awareness: Emergent Problem Solving: Slow processing, Requires verbal cues          General Comments General comments (skin integrity, edema, etc.): VSS    Exercises     Assessment/Plan    PT Assessment Patient needs continued PT services  PT Problem List Decreased strength;Decreased activity tolerance;Decreased balance;Decreased mobility;Decreased knowledge of use of DME;Decreased safety awareness;Decreased knowledge of precautions       PT Treatment Interventions DME instruction;Gait training;Stair training;Functional mobility  training;Therapeutic activities;Therapeutic exercise;Balance training;Cognitive remediation;Patient/family education    PT Goals (Current goals can be found in the Care Plan section)  Acute Rehab PT Goals Patient Stated Goal: Home today, ASAP PT Goal Formulation: With patient/family Time For Goal Achievement: 02/09/23 Potential to Achieve Goals: Good    Frequency Min 1X/week     Co-evaluation               AM-PAC PT "6 Clicks" Mobility  Outcome Measure Help needed turning from your back to your side while in a flat bed without using bedrails?: None Help needed moving from lying on your back to sitting on the side of a flat bed without using bedrails?: None Help needed moving to and from a bed to a chair (including a wheelchair)?: A Little Help needed standing up from a chair using your arms (e.g., wheelchair or bedside chair)?: A Little Help needed to walk in hospital room?: A Little Help needed climbing 3-5 steps with a railing? : A Little 6 Click Score: 20    End of Session Equipment Utilized During Treatment: Gait belt Activity Tolerance: Patient tolerated treatment well Patient left: in chair;with call bell/phone within reach;with chair alarm set;with family/visitor present Nurse Communication: Mobility status PT Visit Diagnosis: Unsteadiness on feet (R26.81);Difficulty in walking, not elsewhere classified (R26.2)    Time: 1610-9604 PT Time Calculation (min) (ACUTE ONLY): 27 min   Charges:   PT Evaluation $PT Eval Moderate Complexity: 1 Mod PT Treatments $Gait Training: 8-22 mins PT General Charges $$ ACUTE PT VISIT: 1 Visit  Conni Slipper, PT, DPT Acute Rehabilitation Services Secure Chat Preferred Office: [phone]   Marylynn Pearson 02/02/2023, 2:54 PM

## 2023-02-02 NOTE — Progress Notes (Signed)
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  Patient had CT on 09/2022 demonstrated mild progression of a left upper lobe nodule suspicious for metastatic disease versus primary bronchogenic neoplasm,RUL nodule otherwise appears unchanged>subsequently underwent bronchoscopy with pulmonologist Dr. 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 e then had follow-up with radiology oncology on 11/28/2022 and treatment with bilateral SBRT radiotherapy.He followed up with [facility] Dr. Pollyann Kennedy on 12/16/2022 and had indirect laryngoscopy revealing healthy mobile cords with no supraglottic mass identified. Patient since underwent about 5 sessions of radiation for his lung cancer.  He has been doing well but has been having significant weight loss and decreased appetite.  In the past weeks he has mentioned to family that he feels tired and has thoughts of giving up on his treatment.  In the past few days, he has cut down on his alcohol use with just a sip earlier today.  Also cut down his oxycodone use from 5 times a day to this 3 times daily.  Grandsons unsure if he is having less pain or just did not want to take his medication anymore. Past few days had intermittent slurred speech and saying things that do not make sense. Family has noticed increase gargling especially in his sleep. He has not been able to cough up sputum and sometimes sticking his finger down his throat to get it out.Larey Seat and hit his abdominal a few weeks ago but has not complaint of pain since. Continues to be a chain smoker with no desire to quit.  In the ED, he  was afebrile BP elevated 164/84 on room air. He was very restless in bed and trying to take his hospital gown off.Not answering questions appropriately and just chuckles. 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 EtOH less than 10. Send UDS and UA is pending at the time of admission. CT head obtained showed no evidence of acute infarct or intracranial hemorrhage.  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 MRI brain was negative for acute intracranial process. Palliative care was consulted and admitted. Patient mental status has since improved,alert awake oriented.He is wanting to go home after his grandson arrives and after he meets with palliative care/hospice team..    Subjective: Seen and examined. he is aaox3 No complaints   Assessment and Plan: Principal Problem:   AMS (altered mental status) Active Problems:   Tobacco use disorder   Malignant neoplasm of right upper lobe of lung (HCC)   History of throat cancer   Acute encephalopathy unclear etiology- resolved In the past few days with slurred speech and now not recognizing family at bedside. Labs>no leukocytosis but on exam has labored respiration and family reports recently gar labling during sleep.CXR w/ emphysema.  UA no evidence of UTI. CT head and MRI brain otherwise without acute findings. 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  No obvious etiology-now in baseline, alert awake oriented x 3.  Minimize psychotropic medication, minimize pain medication.  Avoid alcohol use.     History of throat cancer -hx of supraglottitis cancer diagnosed  in 2022. He was in remission in 2023 following radiation.-followed up with [facility] Dr. Pollyann Kennedy on 12/16/2022 and had indirect laryngoscopy revealing healthy mobile cords with no  supraglottic mass identified.   Hypokalemia:Replaced   Thrombocytopenia:Monitor   Tobacco use disorder Encourage cessation    Malignant neoplasm of right upper lobe of lung -Dx with bronchoscopy with biopsy with pulmonologist Dr. 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  -has underwent 5 sessions of radiation. Pending follow up CT imaging in October.Family is unsure if he would want to continue to pursue aggressive treatment. Recently has expressed wanting to give up.Palliative care has been consulted> they had extensive discussion with the patient and he did not want to pursue aggressive care at this point and wanted to continue with DNR/DNI and hospice services and hospice meeting was done, patient's family grandson was very supportive of his decision.  They want to follow-up with PCP for further discharge on hospice. Patient mental status has since improved,alert awake oriented.He is wanting to go home.  Got slightly agitated patient reports he was in argument with his grandson> he follows commands appropriately.  DVT prophylaxis: enoxaparin (LOVENOX) injection 40 mg Start: 02/02/23 1000 Code Status:   Code Status: DNR Family Communication: plan of care discussed with patient at bedside. Patient status is:  admitted as observation but remains hospitalized for ongoing  because of safe disposition Level of care: Telemetry Medical   Dispo: The patient is from: [facility]            Anticipated disposition: home once family arrives today or in am. Objective: Vitals last 24 hrs: Vitals:   02/02/23 0300 02/02/23 0756 02/02/23 1202 02/02/23 1520  BP: (!) 155/71 (!) 140/101 133/74 (!) 162/72  Pulse: 65 62 64 72  Resp: 18 18 18 18   Temp: 98.1 F (36.7 C) 98.1 F (36.7 C) 98.3 F (36.8 C) 99 F (37.2 C)  TempSrc: Oral Oral Oral Oral  SpO2: 100% 97% 100% 97%   Weight change:   Physical Examination: General exam: alert awake, older than  stated age HEENT:Oral mucosa moist, Ear/Nose WNL grossly Respiratory system: bilaterally clkear BS, no use of accessory muscle Cardiovascular system: S1 & S2 +, No JVD. Gastrointestinal system: Abdomen soft,NT,ND, BS+ Nervous System:Alert, awake, moving extremities. Extremities: LE edema neg,distal peripheral pulses palpable.  Skin: No rashes,no icterus. MSK: Normal muscle bulk,tone, power  Medications reviewed:  Scheduled Meds:  enoxaparin (LOVENOX) injection  40 mg Subcutaneous Daily   potassium chloride  20 mEq Oral Once   Continuous Infusions:  lactated ringers 125 mL/hr at 02/02/23 0300      Diet Order             Diet regular Room service appropriate? Yes; Fluid consistency: Thin  Diet effective now                            Intake/Output Summary (Last 24 hours) at 02/02/2023 1609 Last data filed at 02/02/2023 0600 Gross per 24 hour  Intake 250 ml  Output 400 ml  Net -150 ml   Net IO Since Admission: -150 mL [02/02/23 1609]  Wt Readings from Last 3 Encounters:  11/28/22 55.9 kg  10/13/22 56.2 kg  10/02/22 56.2 kg     Unresulted Labs (From admission, onward)     Start     Ordered   02/01/23 1914  Differential  (Stroke Panel (PNL))  ONCE - STAT,  URGENT        02/01/23 1914          Data Reviewed: I have personally reviewed following labs and imaging studies CBC: Recent Labs  Lab 02/01/23 1919 02/01/23 1925 02/02/23 0408  WBC 5.5  --  4.9  NEUTROABS 2.5  --   --   HGB 12.7* 12.9* 12.6*  HCT 38.3* 38.0* 36.8*  MCV 97.7  --  95.3  PLT 129*  --  124*   Basic Metabolic Panel: Recent Labs  Lab 02/01/23 1919 02/01/23 1925 02/02/23 0408  NA 140 139 138  K 4.0 3.9 3.4*  CL 103 107 103  CO2 23  --  23  GLUCOSE 90 85 84  BUN 17 17 13   CREATININE 0.96 1.00 0.85  CALCIUM 8.6*  --  8.3*   GFR: CrCl cannot be calculated (Unknown ideal weight.). Liver Function Tests: Recent Labs  Lab 02/01/23 1919  AST 17  ALT 9  ALKPHOS 56   BILITOT 0.5  PROT 5.7*  ALBUMIN 3.4*   No results for input(s): "LIPASE", "AMYLASE" in the last 168 hours. No results for input(s): "AMMONIA" in the last 168 hours. Coagulation Profile: Recent Labs  Lab 02/01/23 1919  INR 1.0   BNP (last 3 results) No results for input(s): "PROBNP" in the last 8760 hours. HbA1C: No results for input(s): "HGBA1C" in the last 72 hours. CBG: Recent Labs  Lab 02/01/23 1920  GLUCAP 88   Lipid Profile: No results for input(s): "CHOL", "HDL", "LDLCALC", "TRIG", "CHOLHDL", "LDLDIRECT" in the last 72 hours. Thyroid Function Tests: No results for input(s): "TSH", "T4TOTAL", "FREET4", "T3FREE", "THYROIDAB" in the last 72 hours. Sepsis Labs: No results for input(s): "PROCALCITON", "LATICACIDVEN" in the last 168 hours.  No results found for this or any previous visit (from the past 240 hour(s)).  Antimicrobials: Anti-infectives (From admission, onward)    None      Culture/Microbiology    Component Value Date/Time   SDES BRONCHIAL ALVEOLAR LAVAGE 10/13/2022 1014   SDES BRONCHIAL ALVEOLAR LAVAGE 10/13/2022 1014   SPECREQUEST NONE 10/13/2022 1014   SPECREQUEST NONE 10/13/2022 1014   CULT  10/13/2022 1014    NO GROWTH 2 DAYS Performed at [facility], [address], [place], [place] [address]    CULT  10/13/2022 1014    No growth aerobically or anaerobically. Performed at [facility], [address], [place], [place] [address]    REPTSTATUS 10/15/2022 FINAL 10/13/2022 1014   REPTSTATUS 10/18/2022 FINAL 10/13/2022 1014    Other culture-see note  Radiology Studies: DG Chest Port 1 View  Result Date: 02/02/2023 CLINICAL DATA:  Altered mental status. EXAM: PORTABLE CHEST 1 VIEW COMPARISON:  Oct 13, 2022 FINDINGS: The heart size and mediastinal contours are within normal limits. The lungs are hyperinflated with evidence of emphysematous lung disease and mild, chronic appearing increased interstitial lung markings. Small biopsy  clips are seen within the bilateral upper lobes. There is no evidence of acute infiltrate, pleural effusion or pneumothorax. The visualized skeletal structures are unremarkable. IMPRESSION: Emphysematous lung disease and post biopsy changes, as described above, without acute or active cardiopulmonary disease. Electronically Signed   By: Aram Candela M.D.   On: 02/02/2023 00:58   MR BRAIN WO CONTRAST  Result Date: 02/01/2023 CLINICAL DATA:  Stroke suspected, altered mental status, aphasia EXAM: MRI HEAD WITHOUT CONTRAST TECHNIQUE: Multiplanar, multiecho pulse sequences of the brain and surrounding structures were obtained without intravenous contrast. COMPARISON:  No prior MRI head  available, correlation is made with CT head 02/01/2023 FINDINGS: Brain: No restricted diffusion to suggest acute or subacute infarct. No acute hemorrhage, mass, mass effect, or midline shift. No hydrocephalus or extra-axial collection. Normal pituitary and craniocervical junction. Scattered foci of hemosiderin deposition in the bilateral cerebral hemispheres, deep gray structures, and right cerebellum, which are nonspecific but likely the sequela of prior hypertensive microhemorrhages are associated with remote lacunar infarcts. Remote lacunar infarcts are noted in the bilateral basal ganglia thalami, and corona radiata. Confluent T2 hyperintense signal in the periventricular white matter, likely the sequela of severe chronic small vessel ischemic disease. Mildly advanced cerebral atrophy for age. Dilated perivascular spaces in the bilateral basal ganglia. Vascular: Normal arterial flow voids. Skull and upper cervical spine: Normal marrow signal. Sinuses/Orbits: Mild mucosal thickening in the ethmoid air cells. Status post bilateral lens replacements. Other: The mastoid air cells are well aerated. IMPRESSION: No acute intracranial process. No evidence of acute or subacute infarct. Electronically Signed   By: Wiliam Ke M.D.    On: 02/01/2023 23:04   CT Head Wo Contrast  Result Date: 02/01/2023 CLINICAL DATA:  Mental status change, unknown cause. Stroke-like symptoms beginning 2 days ago. Aphasia. EXAM: CT HEAD WITHOUT CONTRAST TECHNIQUE: Contiguous axial images were obtained from the base of the skull through the vertex without intravenous contrast. RADIATION DOSE REDUCTION: This exam was performed according to the departmental dose-optimization program which includes automated exposure control, adjustment of the mA and/or kV according to patient size and/or use of iterative reconstruction technique. COMPARISON:  Head CT 05/20/2007 FINDINGS: Brain: There is no evidence of an acute cortically based infarct, intracranial hemorrhage, mass, midline shift, or extra-axial fluid collection. There is progressive, mild-to-moderate cerebral atrophy. Confluent hypodensities throughout the cerebral white matter bilaterally have greatly progressed from the prior study and are nonspecific but compatible with severe chronic small vessel ischemic disease. A new lacunar infarct involving the left corona radiata and external capsule is chronic in appearance. There are additional age indeterminate lacunar infarcts in the basal ganglia and thalami bilaterally. Vascular: Calcified atherosclerosis at the skull base. No hyperdense vessel. Skull: No acute fracture or suspicious osseous lesion. Sinuses/Orbits: No significant inflammatory changes in the included paranasal sinuses. Clear mastoid air cells. Bilateral cataract extraction. Other: None. IMPRESSION: 1. No evidence of an acute cortically based infarct or intracranial hemorrhage. 2. Severe chronic small vessel ischemic disease with age indeterminate lacunar infarcts in the deep gray nuclei. Electronically Signed   By: Sebastian Ache M.D.   On: 02/01/2023 20:12     LOS: 0 days   Lanae Boast, MD [facility]  02/02/2023, 4:09 PM

## 2023-02-02 NOTE — Progress Notes (Signed)
Writer went over discharge instructions with patient and family at bedside, no further questions at this time.

## 2023-02-02 NOTE — Assessment & Plan Note (Addendum)
-  acute mental status change in the past few days with slurred speech and today not recognizing family at bedside -no leukocytosis but on exam has labored respiration and family reports recently gargling during sleep. Obtain stat CXR.  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 He is not tachycardic or tremulous on exam to suggest that his AMS is related to withdraw symptoms.  -He has also cut down on his oxycodone use from 5 to 3 a pill. Again doubt this drastic change in mental status is from any withdrawal effects.  -Had in depth discussion with family regarding his recent cancer diagnosis and that if we are unable to discover any identifiable source of his acute decline he could likely be dying. Family understands and expecting this could be a possibility. They wish for him to be DNR status at this time.

## 2023-02-02 NOTE — ED Notes (Signed)
Patient assisted with urinal, changed bed sheets, new blankets provided.

## 2023-02-02 NOTE — Hospital Course (Addendum)
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 Patient had CT on 09/2022 demonstrated mild progression of a left upper lobe nodule suspicious for metastatic disease versus primary bronchogenic neoplasm,RUL nodule otherwise appears unchanged>subsequently underwent bronchoscopy with pulmonologist Dr. 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 e then had follow-up with radiology oncology on 11/28/2022 and treatment with bilateral SBRT radiotherapy.He followed up with [facility] Dr. Pollyann Kennedy on 12/16/2022 and had indirect laryngoscopy revealing healthy mobile cords with no supraglottic mass identified. Patient since underwent about 5 sessions of radiation for his lung cancer.  He has been doing well but has been having significant weight loss and decreased appetite. In the past weeks he has mentioned to family that he feels tired and has thoughts of giving up on his treatment.  In the past few days, he has cut down on his alcohol use with just a sip earlier today.  Also cut down his oxycodone use from 5 times a day to this 3 times daily.  Grandsons unsure if he is having less pain or just did not want to take his medication anymore. Past few days had intermittent slurred speech and saying things that do not make sense. Family has noticed increase gargling especially in his sleep. He has not been able to cough up sputum and sometimes sticking his finger down his throat to get it out.Larey Seat and hit his abdominal a few weeks ago but has not complaint of pain since. Continues to be a chain smoker with no desire to quit.  In the ED, he was afebrile ,restless,CBC without leukocytosis.Hb stable at 12.7.Has chronic thrombocytopenia also stable at 129.CMP unremarkable. EtOH less than  10. Send UDS and UA is pending at the time of admission. CT head obtained showed no evidence of acute infarct or intracranial hemorrhage.  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 MRI brain was negative for acute intracranial process. Palliative care was consulted and admitted.

## 2023-02-02 NOTE — Assessment & Plan Note (Signed)
-  continues to smoke up to 2 packs daily with no desire for cessation

## 2023-02-02 NOTE — Discharge Summary (Signed)
Physician Discharge Summary  Donald Larson JWJ:[id number] DOB: 1945/06/20 DOA: 02/01/2023  PCP: Jearld Lesch, MD  Admit date: 02/01/2023 Discharge date: 02/02/2023 Recommendations for Outpatient Follow-up:  Follow up with PCP in 1 weeks-call for appointment Please obtain BMP/CBC in one week  Discharge Dispo: Home w/ grandson. Discharge Condition: Stable Code Status:   Code Status: DNR Diet recommendation:  Diet Order             Diet regular Room service appropriate? 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 Patient had CT on 09/2022 demonstrated mild progression of a left upper lobe nodule suspicious for metastatic disease versus primary bronchogenic neoplasm,RUL nodule otherwise appears unchanged>subsequently underwent bronchoscopy with pulmonologist Dr. 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 e then had follow-up with radiology oncology on 11/28/2022 and treatment with bilateral SBRT radiotherapy.He followed up with [facility] Dr. Pollyann Kennedy on 12/16/2022 and had indirect laryngoscopy revealing healthy mobile cords with no supraglottic mass identified. Patient since underwent about 5 sessions of radiation for his lung cancer.  He has been doing well but has been having significant weight loss and decreased appetite. In the past weeks he has mentioned to family that he feels tired and has thoughts of giving up on his treatment.  In the past few days, he has cut down on his alcohol use with just a sip earlier today.  Also cut down his oxycodone use from 5 times a day to this 3 times daily.  Grandsons unsure if he is having less pain or just did  not want to take his medication anymore. Past few days had intermittent slurred speech and saying things that do not make sense. Family has noticed increase gargling especially in his sleep. He has not been able to cough up sputum and sometimes sticking his finger down his throat to get it out.Larey Seat and hit his abdominal a few weeks ago but has not complaint of pain since. Continues to be a chain smoker with no desire to quit.  In the ED, he was afebrile ,restless,CBC without leukocytosis.Hb stable at 12.7.Has chronic thrombocytopenia also stable at 129.CMP unremarkable. EtOH less than 10. Send UDS and UA is pending at the time of admission. CT head obtained showed no evidence of acute infarct or intracranial hemorrhage.  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 MRI brain was negative for acute intracranial process. Palliative care was consulted and admitted.  Subsequently patient grandson arrived patient appeared calm earlier he had an argument with his grandson. They will be taking him home tonight,Patient is being discharged  Discharge Diagnoses:  Principal Problem:   AMS (altered mental status) Active Problems:   Tobacco use disorder   Malignant neoplasm of right upper lobe of lung (HCC)   History of throat cancer   Acute encephalopathy unclear etiology- resolved In the past few days with slurred speech and now not recognizing family at bedside. Labs>no leukocytosis but on exam has labored respiration and family reports recently gar labling during sleep.CXR w/ emphysema.  UA no evidence of UTI. CT head and MRI brain otherwise without acute findings. 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  No obvious etiology-now in baseline, alert awake oriented x 3.  Minimize psychotropic medication, minimize pain medication.  Avoid alcohol use.     History of throat cancer -hx of supraglottitis  cancer diagnosed in 2022. He was in remission in 2023 following radiation.-followed up with [facility] Dr. Pollyann Kennedy on 12/16/2022 and had indirect laryngoscopy revealing healthy mobile cords with no supraglottic mass identified.   Hypokalemia:Replaced   Thrombocytopenia:Monitor   Tobacco use disorder Encourage cessation    Malignant neoplasm of right upper lobe of lung -Dx with bronchoscopy with biopsy with pulmonologist Dr. 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  -has underwent 5 sessions of radiation. Pending follow up CT imaging in October.Family is unsure if he would want to continue to pursue aggressive treatment. Recently has expressed wanting to give up.Palliative care has been consulted> they had extensive discussion with the patient and he did not want to pursue aggressive care at this point and wanted to continue with DNR/DNI and hospice services and hospice meeting was done, patient's family grandson was very supportive of his decision.  They want to follow-up with PCP for further discharge on hospice. Patient mental status has since improved,alert awake oriented.He is wanting to go home.  Got slightly agitated patient reports he was in argument with his grandson> he follows commands appropriately.rtive of his decision.      Consults: Pmt  Subjective: Aaox3 Resting comfortably. No complaints  Discharge Exam: Vitals:   02/02/23 1202 02/02/23 1520  BP: 133/74 (!) 162/72  Pulse: 64 72  Resp: 18 18  Temp: 98.3 F (36.8 C) 99 F (37.2 C)  SpO2: 100% 97%   General: Pt is alert, awake, not in acute distress Cardiovascular: RRR, S1/S2 +, no rubs, no gallops Respiratory: CTA bilaterally, no wheezing, no rhonchi Abdominal: Soft, NT, ND, bowel sounds + Extremities: no edema, no cyanosis  Discharge Instructions  Discharge Instructions     Discharge instructions   Complete by: As directed    Please call call MD or  return to ER for similar or worsening recurring problem that brought you to hospital or if any fever,nausea/vomiting,abdominal pain, uncontrolled pain, chest pain,  shortness of breath or any other alarming symptoms.  Please follow-up your doctor as instructed in a week time and call the office for appointment.  Please avoid alcohol, smoking, or any other illicit substance and maintain healthy habits including taking your regular medications as prescribed.  You were cared for by a hospitalist during your hospital stay. If you have any questions about your discharge medications or the care you received while you were in the hospital after you are discharged, you can call the unit and ask to speak with the hospitalist on call if the hospitalist that took care of you is not available.  Once you are discharged, your primary care physician will handle any further medical issues. Please note that NO REFILLS for any discharge medications will be authorized once you are discharged, as it is imperative that you return to your primary care physician (or establish a relationship with a primary care physician if you do not have one) for your aftercare needs so that they can reassess your need for medications and monitor your lab values   Increase activity slowly   Complete by: As directed       Allergies as of 02/02/2023   No Known Allergies  Medication List     STOP taking these medications    traZODone 50 MG tablet Commonly known as: DESYREL       TAKE these medications    BC FAST PAIN RELIEF PO Take 2 packets by mouth daily as needed (pain).   omeprazole 20 MG capsule Commonly known as: PRILOSEC Take 20 mg by mouth daily.   Oxycodone HCl 10 MG Tabs Take 10 mg by mouth every 6 (six) hours as needed (pain).        Follow-up Information     Jearld Lesch, MD Follow up in 1 week(s).   Specialty: Family Medicine Contact information: [address] [place]  [address] [phone]                No Known Allergies  The results of significant diagnostics from this hospitalization (including imaging, microbiology, ancillary and laboratory) are listed below for reference.    Microbiology: No results found for this or any previous visit (from the past 240 hour(s)).  Procedures/Studies: DG Chest Port 1 View  Result Date: 02/02/2023 CLINICAL DATA:  Altered mental status. EXAM: PORTABLE CHEST 1 VIEW COMPARISON:  Oct 13, 2022 FINDINGS: The heart size and mediastinal contours are within normal limits. The lungs are hyperinflated with evidence of emphysematous lung disease and mild, chronic appearing increased interstitial lung markings. Small biopsy clips are seen within the bilateral upper lobes. There is no evidence of acute infiltrate, pleural effusion or pneumothorax. The visualized skeletal structures are unremarkable. IMPRESSION: Emphysematous lung disease and post biopsy changes, as described above, without acute or active cardiopulmonary disease. Electronically Signed   By: Aram Candela M.D.   On: 02/02/2023 00:58   MR BRAIN WO CONTRAST  Result Date: 02/01/2023 CLINICAL DATA:  Stroke suspected, altered mental status, aphasia EXAM: MRI HEAD WITHOUT CONTRAST TECHNIQUE: Multiplanar, multiecho pulse sequences of the brain and surrounding structures were obtained without intravenous contrast. COMPARISON:  No prior MRI head available, correlation is made with CT head 02/01/2023 FINDINGS: Brain: No restricted diffusion to suggest acute or subacute infarct. No acute hemorrhage, mass, mass effect, or midline shift. No hydrocephalus or extra-axial collection. Normal pituitary and craniocervical junction. Scattered foci of hemosiderin deposition in the bilateral cerebral hemispheres, deep gray structures, and right cerebellum, which are nonspecific but likely the sequela of prior hypertensive microhemorrhages are associated with remote lacunar infarcts.  Remote lacunar infarcts are noted in the bilateral basal ganglia thalami, and corona radiata. Confluent T2 hyperintense signal in the periventricular white matter, likely the sequela of severe chronic small vessel ischemic disease. Mildly advanced cerebral atrophy for age. Dilated perivascular spaces in the bilateral basal ganglia. Vascular: Normal arterial flow voids. Skull and upper cervical spine: Normal marrow signal. Sinuses/Orbits: Mild mucosal thickening in the ethmoid air cells. Status post bilateral lens replacements. Other: The mastoid air cells are well aerated. IMPRESSION: No acute intracranial process. No evidence of acute or subacute infarct. Electronically Signed   By: Wiliam Ke M.D.   On: 02/01/2023 23:04   CT Head Wo Contrast  Result Date: 02/01/2023 CLINICAL DATA:  Mental status change, unknown cause. Stroke-like symptoms beginning 2 days ago. Aphasia. EXAM: CT HEAD WITHOUT CONTRAST TECHNIQUE: Contiguous axial images were obtained from the base of the skull through the vertex without intravenous contrast. RADIATION DOSE REDUCTION: This exam was performed according to the departmental dose-optimization program which includes automated exposure control, adjustment of the mA and/or kV according to patient size and/or use of iterative reconstruction technique. COMPARISON:  Head CT 05/20/2007 FINDINGS: Brain: There is no evidence of an acute cortically based infarct, intracranial hemorrhage, mass, midline shift, or extra-axial fluid collection. There is progressive, mild-to-moderate cerebral atrophy. Confluent hypodensities throughout the cerebral white matter bilaterally have greatly progressed from the prior study and are nonspecific but compatible with severe chronic small vessel ischemic disease. A new lacunar infarct involving the left corona radiata and external capsule is chronic in appearance. There are additional age indeterminate lacunar infarcts in the basal ganglia and thalami  bilaterally. Vascular: Calcified atherosclerosis at the skull base. No hyperdense vessel. Skull: No acute fracture or suspicious osseous lesion. Sinuses/Orbits: No significant inflammatory changes in the included paranasal sinuses. Clear mastoid air cells. Bilateral cataract extraction. Other: None. IMPRESSION: 1. No evidence of an acute cortically based infarct or intracranial hemorrhage. 2. Severe chronic small vessel ischemic disease with age indeterminate lacunar infarcts in the deep gray nuclei. Electronically Signed   By: Sebastian Ache M.D.   On: 02/01/2023 20:12    Labs: BNP (last 3 results) No results for input(s): "BNP" in the last 8760 hours. Basic Metabolic Panel: Recent Labs  Lab 02/01/23 1919 02/01/23 1925 02/02/23 0408  NA 140 139 138  K 4.0 3.9 3.4*  CL 103 107 103  CO2 23  --  23  GLUCOSE 90 85 84  BUN 17 17 13   CREATININE 0.96 1.00 0.85  CALCIUM 8.6*  --  8.3*   Liver Function Tests: Recent Labs  Lab 02/01/23 1919  AST 17  ALT 9  ALKPHOS 56  BILITOT 0.5  PROT 5.7*  ALBUMIN 3.4*   No results for input(s): "LIPASE", "AMYLASE" in the last 168 hours. No results for input(s): "AMMONIA" in the last 168 hours. CBC: Recent Labs  Lab 02/01/23 1919 02/01/23 1925 02/02/23 0408  WBC 5.5  --  4.9  NEUTROABS 2.5  --   --   HGB 12.7* 12.9* 12.6*  HCT 38.3* 38.0* 36.8*  MCV 97.7  --  95.3  PLT 129*  --  124*  Cardiac Enzymes: No results for input(s): "CKTOTAL", "CKMB", "CKMBINDEX", "TROPONINI" in the last 168 hours. BNP: Invalid input(s): "POCBNP" CBG: Recent Labs  Lab 02/01/23 1920  GLUCAP 88      Component Value Date/Time   COLORURINE YELLOW 02/02/2023 0016   APPEARANCEUR CLEAR 02/02/2023 0016   LABSPEC 1.024 02/02/2023 0016   PHURINE 5.0 02/02/2023 0016   GLUCOSEU NEGATIVE 02/02/2023 0016   HGBUR NEGATIVE 02/02/2023 0016   BILIRUBINUR NEGATIVE 02/02/2023 0016   KETONESUR NEGATIVE 02/02/2023 0016   PROTEINUR NEGATIVE 02/02/2023 0016   NITRITE  NEGATIVE 02/02/2023 0016   LEUKOCYTESUR NEGATIVE 02/02/2023 0016   Sepsis Labs Recent Labs  Lab 02/01/23 1919 02/02/23 0408  WBC 5.5 4.9   Microbiology No results found for this or any previous visit (from the past 240 hour(s)).  Time coordinating discharge: 25 minutes  SIGNED: Lanae Boast, MD  [facility] 02/02/2023, 4:32 PM  If 7PM-7AM, please contact night-coverage [email or web address]

## 2023-02-02 NOTE — Assessment & Plan Note (Addendum)
-  Dx with bronchoscopy with biopsy with pulmonologist Dr. 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   -has underwent 5 sessions of radiation. Pending follow up CT imaging in October.  -Family is unsure if he would want to continue to pursue aggressive treatment. Recently has expressed wanting to give up. Possible signs of depression.  -could benefit from palliative care if no treatment conditions are discovered during this admission.
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
## 2023-02-02 NOTE — Plan of Care (Signed)

## 2023-02-02 NOTE — Assessment & Plan Note (Signed)
-  hx of supraglottitis cancer diagnosed in 2022. He was in remission in 2023 following radiation.  -followed up with [facility] Dr. Pollyann Kennedy on 12/16/2022 and had indirect laryngoscopy revealing healthy mobile cords with no supraglottic mass identified.

## 2023-02-02 NOTE — Consult Note (Addendum)
Palliative Medicine Inpatient Consult Note  Consulting Provider: Anselm Jungling, DO   Reason for consult:   Palliative Care Consult Services Palliative Medicine Consult  Reason for Consult? recent dx of NSCLC Cx s/p radiation presents with rapidly decline, AMS without clear cause   02/02/2023  HPI:  Per intake H&P --> Donald Larson is a 77 y.o. 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   Palliative care asked to get involved to support additional goals of care conversations.   Clinical Assessment/Goals of Care:  *Please note that this is a verbal dictation therefore any spelling or grammatical errors are due to the "Dragon Medical One" system interpretation.  I have reviewed medical records including EPIC notes, labs and imaging, received report from bedside RN, assessed the patient who is resting in bed.    I met with Donald Larson to further discuss diagnosis prognosis, GOC, EOL wishes, disposition and options.   I introduced Palliative Medicine as specialized medical care for people living with serious illness. It focuses on providing relief from the symptoms and stress of a serious illness. The goal is to improve quality of life for both the patient and the family.  Medical History Review and Understanding:  Discussed Caliber's history of supraglottis cancer and pulmonary nodules w/ biopsy indicating non-small cell lung cancer.  Reviewed additional history of arthritis, left eye blindness, shingles, GERD, COPD still active smoking.  Social History:  Steveson shares that he is from [facility] originally.  He presently lives in [place] with his grandson.  He is divorced.  He shares that he has raised 2 sons and 2 daughters though they are not biologically his.  He shares  that his closest relationship is with a grandson Geographical information systems officer.  He used to work replacing windows.  He is a man of faith and practices within Christianity.  Functional and Nutritional State:  Preceding hospitalization Donald Larson lives with his grandson Donald Larson who helped him with the majority of his BADLs.  Advance Directives:  A detailed discussion was had today regarding advanced directives.    Code Status:  Concepts specific to code status, artifical feeding and hydration, continued IV antibiotics and rehospitalization was had.  The difference between a aggressive medical intervention path  and a palliative comfort care path for this patient at this time was had.   Encouraged patient/family to consider DNR/DNI status understanding evidenced based poor outcomes in similar hospitalized patient, as the cause of arrest is likely associated with advanced chronic/terminal illness rather than an easily reversible acute cardio-pulmonary event. I explained that DNR/DNI does not change the medical plan and it only comes into effect after a person has arrested (died).  It is a protective measure to keep [place] from harming the patient in their last moments of life.  [name] does not want resuscitation under any circumstances.  He shares a readiness to die when it is time.  Discussion:  Discussed Donald Larson's previous and present health.  Reviewed his wishes given all he has been through in terms of his head and neck cancer as well as his lung cancer.  He shares that he is already been through radiation therapy and chemotherapy and is done everything he can do to eradicate the cancer though he recognizes that this might not be realistically possible.  When asked the question of whether or not  he wants to continue more aggressive treatments or focus on comfort at the end of his life he is fairly clear that he wants to go home.  I did gently introduce the topic of hospice care which is something he is familiar with.  I  described hospice as a service for patients who have a life expectancy of 6 months or less. The goal of hospice is the preservation of dignity and quality at the end phases of life.  Under hospice care, the focus changes from curative to symptom relief.  Donald Larson is very much aligned with going home with hospice care and at peace with his limited life expectancy.  We reviewed it is never clear how long someone's time may be on this earth though Donald Larson is vocal that he realizes this time is short.  Discussed the importance of continued conversation with family and their  medical providers regarding overall plan of care and treatment options, ensuring decisions are within the context of the patients values and GOCs. _______________________________ Addendum:  I spoke to [facility] grandson Donald Larson and described the above conversation.  Donald Larson was exceptionally emotional during his time on the phone with me given how important [name] is to him.  I observed his emotions through therapeutic listening.  Donald Larson is agreeable to meet with hospice his preference is [facility] [place] as his aunt passed away on their services about 1 year ago and it was an exceptionally pleasant experience for he and his family.  I shared I would reach out to the liaison to set up a meeting time Donald Larson would like is not to be present also. __________________________________ Addendum 2:  I met with Donald Larson, his daughter, Donald Larson, and grandson, Donald Larson this afternoon. We again, discussed options moving forward. Patient shares that he has a good amount of pressure on him to stay alive. He states that if he is going to die then he may as well, "die already". He shares great anxiety and the hope for discharge.  I expressed the importance of understanding [name]'s desires in the presence of his family. I shared that it is important we honor ad respect his wishes.  Patients grandson vocalizes that he plans to reach out to [place], of  [facility] in the oncoming days. They would first like to meet with patients primary doctor tomorrow and make further decisions from there.  Hospice was discussed in great detail. Family share that they will talk further among themselves and for the time being at least for feel supportive measures are in place to provide safe care for [place].   Trever states he is ready to leave - I shared that I would reach out to the hospitalist.   Additional Time: 30  Decision Maker: Doss,[name] Lucila [place]): [phone] (Home Phone)   SUMMARY OF RECOMMENDATIONS   DNAR/DNI  Open and honest conversations held in the setting of patient's advanced cancer  Patients grandson plans to speak to family physician tomorrow  Donald Larson has [facility] information for home hospice should it be desired in the near future  Ongoing palliative care support  Code Status/Advance Care Planning: DNAR/DNI    Palliative Prophylaxis:  Aspiration, Bowel Regimen, Delirium Protocol, Frequent Pain Assessment, Oral Care, Palliative Wound Care, and Turn Reposition  Additional Recommendations (Limitations, Scope, Preferences): Continue present measures  Psycho-social/Spiritual:  Desire for further Chaplaincy support: Yes Additional Recommendations: Education on disease burden   Prognosis: Limited to months is hospice appropriate candidate  Discharge Planning: Discharge home once medically optimized  Vitals:  02/02/23 0300 02/02/23 0756  BP: (!) 155/71 (!) 140/101  Pulse: 65 62  Resp: 18 18  Temp: 98.1 F (36.7 C) 98.1 F (36.7 C)  SpO2: 100% 97%    Intake/Output Summary (Last 24 hours) at 02/02/2023 1052 Last data filed at 02/02/2023 0600 Gross per 24 hour  Intake 250 ml  Output 400 ml  Net -150 ml   Gen: Elderly Caucasian male in no acute distress HEENT: moist mucous membranes CV: Regular rate and rhythm PULM: On room air breathing is even and nonlabored ABD: soft/nontender EXT: No edema Neuro: Alert and  oriented x3  PPS: 40%   This conversation/these recommendations were discussed with patient primary care team, Dr. Jonathon Bellows  Total Time: 92 Billing based on MDM: High  Problems Addressed: One acute or chronic illness or injury that poses a threat to life or bodily function  Amount and/or Complexity of Data: Category 3:Discussion of management or test interpretation with external physician/other qualified health care professional/appropriate source (not separately reported)  Risks: Decision not to resuscitate or to de-escalate care because of poor prognosis ______________________________________________________ Lamarr Lulas [place] Palliative Medicine Team Team Cell Phone: [phone] Please utilize secure chat with additional questions, if there is no response within 30 minutes please call the above phone number  Palliative Medicine Team providers are available by phone from 7am to 7pm daily and can be reached through the team cell phone.  Should this patient require assistance outside of these hours, please call the patient's attending physician.

## 2023-02-05 NOTE — Telephone Encounter (Signed)
Spoke with grandson.  Patient ended up going to the hospital. He is doing better now.  Follow up appointment made.

## 2023-02-17 ENCOUNTER — Inpatient Hospital Stay: Payer: Medicare HMO | Attending: Specialist | Admitting: Hematology and Oncology

## 2023-02-18 ENCOUNTER — Other Ambulatory Visit: Payer: Self-pay

## 2023-02-18 DIAGNOSIS — C3411 Malignant neoplasm of upper lobe, right bronchus or lung: Secondary | ICD-10-CM

## 2023-02-18 DIAGNOSIS — C321 Malignant neoplasm of supraglottis: Secondary | ICD-10-CM

## 2023-03-11 ENCOUNTER — Ambulatory Visit: Payer: Medicare HMO | Admitting: Adult Health

## 2023-03-13 ENCOUNTER — Encounter: Payer: Self-pay | Admitting: Adult Health

## 2023-03-20 NOTE — Progress Notes (Signed)
Mr. Cronister presents to clinic today following completion of radiation treatment for lung cancer. He completed treatment on 12-30-22. Pt will have Chest and neck CT on 03-31-23.   CT Soft Tissue Neck W Contrast 03/31/2023  IMPRESSION: No evidence of residual or recurrent disease in the neck.  NIRADS 1.   CT Chest W Contrast 03-31-23 IMPRESSION: 1. Enlarging right middle lobe pulmonary nodule currently 6 by 6 by 6 mm, previously 4 by 3 by 3 mm on 09/26/2022. This is suspicious for a metastatic lesion. 2. Reduced size and solidity of the left upper lobe nodule, currently 6 by 5 mm and previously 10 by 8 mm. Adjacent fiducial noted. Minimally reduced size of the right upper lobe nodule with adjacent fiducial noted. Mild adjacent stranding along both of these nodules may be treatment related. 3. New 5 by 2 mm left upper lobe nodule. New trace right lower lobe subpleural nodularity measuring about 2 by 3 mm. 4. Abnormal right gastric and porta hepatis adenopathy. 5. Coronary, aortic arch, and branch vessel atherosclerotic vascular disease. 6. Airway thickening is present, suggesting bronchitis or reactive airways disease. 7. Centrilobular emphysema. 8. Degenerative glenohumeral arthropathy bilaterally.   Aortic Atherosclerosis (ICD10-I70.0) and Emphysema (ICD10-J43.9).  PAIN:  Pt denies pain at this time.   RESPIRATORY: Shortness of Breath  and cough at baseline. Walking and Coughing  Dry. Pt is on room air. No skin concerns at this time.   SWALLOWING/DIET: Pt denies dysphagia . The patient eats a regular, healthy diet.. Wt Readings from Last 3 Encounters:  04/03/23 121 lb 12.8 oz (55.2 kg)  11/28/22 123 lb 4 oz (55.9 kg)  10/13/22 124 lb (56.2 kg)   OTHER: Pt complains of fatigue and weakness. Baseline for pt. No questions or concerns at this time. States his food intake is improving and trying to maintain a good weight.   There were no vitals taken for this  visit.   Vitals:   04/03/23 1412  BP: 139/62  Pulse: 74  Resp: (!) 24  Temp: (!) 97.4 F (36.3 C)  SpO2: 99%     Wt Readings from Last 3 Encounters:  11/28/22 123 lb 4 oz (55.9 kg)  10/13/22 124 lb (56.2 kg)  10/02/22 124 lb (56.2 kg)

## 2023-03-31 ENCOUNTER — Inpatient Hospital Stay: Payer: Medicare HMO | Attending: Specialist

## 2023-03-31 ENCOUNTER — Ambulatory Visit (HOSPITAL_COMMUNITY)
Admission: RE | Admit: 2023-03-31 | Discharge: 2023-03-31 | Disposition: A | Discharge status: Home / Self Care | Payer: Medicare HMO | Source: Ambulatory Visit | Attending: Radiation Oncology | Admitting: Radiation Oncology

## 2023-03-31 DIAGNOSIS — C321 Malignant neoplasm of supraglottis: Secondary | ICD-10-CM | POA: Diagnosis present

## 2023-03-31 MED ORDER — SODIUM CHLORIDE (PF) 0.9 % IJ SOLN
INTRAMUSCULAR | Status: AC
  Filled 2023-03-31: qty 50

## 2023-03-31 MED ORDER — IOHEXOL 300 MG/ML  SOLN
100.0000 mL | Freq: Once | INTRAMUSCULAR | Status: AC | PRN
  Administered 2023-03-31: 100 mL via INTRAVENOUS

## 2023-04-03 ENCOUNTER — Encounter: Payer: Self-pay | Admitting: Radiation Oncology

## 2023-04-03 ENCOUNTER — Inpatient Hospital Stay: Payer: Medicare HMO

## 2023-04-03 ENCOUNTER — Ambulatory Visit
Admission: RE | Admit: 2023-04-03 | Discharge: 2023-04-03 | Disposition: A | Discharge status: Home / Self Care | Payer: Medicare HMO | Source: Ambulatory Visit | Attending: Radiation Oncology | Admitting: Radiation Oncology

## 2023-04-03 ENCOUNTER — Other Ambulatory Visit: Payer: Self-pay

## 2023-04-03 VITALS — BP 139/62 | HR 74 | Temp 97.4°F | Resp 24 | Ht 68.0 in | Wt 121.8 lb

## 2023-04-03 DIAGNOSIS — I2510 Atherosclerotic heart disease of native coronary artery without angina pectoris: Secondary | ICD-10-CM | POA: Insufficient documentation

## 2023-04-03 DIAGNOSIS — M47816 Spondylosis without myelopathy or radiculopathy, lumbar region: Secondary | ICD-10-CM | POA: Insufficient documentation

## 2023-04-03 DIAGNOSIS — I700 Atherosclerosis of aorta: Secondary | ICD-10-CM | POA: Diagnosis not present

## 2023-04-03 DIAGNOSIS — J432 Centrilobular emphysema: Secondary | ICD-10-CM | POA: Insufficient documentation

## 2023-04-03 DIAGNOSIS — M19012 Primary osteoarthritis, left shoulder: Secondary | ICD-10-CM | POA: Insufficient documentation

## 2023-04-03 DIAGNOSIS — Z79899 Other long term (current) drug therapy: Secondary | ICD-10-CM | POA: Insufficient documentation

## 2023-04-03 DIAGNOSIS — Z85118 Personal history of other malignant neoplasm of bronchus and lung: Secondary | ICD-10-CM | POA: Diagnosis not present

## 2023-04-03 DIAGNOSIS — Z8521 Personal history of malignant neoplasm of larynx: Secondary | ICD-10-CM | POA: Diagnosis present

## 2023-04-03 DIAGNOSIS — M50322 Other cervical disc degeneration at C5-C6 level: Secondary | ICD-10-CM | POA: Insufficient documentation

## 2023-04-03 DIAGNOSIS — R918 Other nonspecific abnormal finding of lung field: Secondary | ICD-10-CM | POA: Diagnosis not present

## 2023-04-03 DIAGNOSIS — M4856XA Collapsed vertebra, not elsewhere classified, lumbar region, initial encounter for fracture: Secondary | ICD-10-CM | POA: Insufficient documentation

## 2023-04-03 DIAGNOSIS — C3411 Malignant neoplasm of upper lobe, right bronchus or lung: Secondary | ICD-10-CM

## 2023-04-03 DIAGNOSIS — M5031 Other cervical disc degeneration,  high cervical region: Secondary | ICD-10-CM | POA: Insufficient documentation

## 2023-04-03 DIAGNOSIS — M51369 Other intervertebral disc degeneration, lumbar region without mention of lumbar back pain or lower extremity pain: Secondary | ICD-10-CM | POA: Diagnosis not present

## 2023-04-03 DIAGNOSIS — R599 Enlarged lymph nodes, unspecified: Secondary | ICD-10-CM | POA: Insufficient documentation

## 2023-04-03 DIAGNOSIS — C321 Malignant neoplasm of supraglottis: Secondary | ICD-10-CM

## 2023-04-05 NOTE — Progress Notes (Signed)
Radiation Oncology         [phone] ________________________________  Name: Donald Larson MRN: [id number]  Date: 04/03/2023  DOB: 1946/01/14  Follow-Up Visit Note  CC: Donald Lesch, MD  Donald Lesch, MD  Diagnosis and Prior Radiotherapy:       ICD-10-CM   1. Malignant neoplasm of right upper lobe of lung (HCC)  C34.11 NM PET Image Restag (PS) Skull Base To Thigh    2. 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  He received 46 Gray in 23 fractions.  He then expressed reluctance to complete treatment.  He did not show for appointments as scheduled. Therefore upon further discussion with Dr. Basilio Larson, he agreed to continue treatment at a hypofractionated technique so that he could come in for less visits.  He tolerated an additional 6 fractions at 2.7 Gray per fraction to yield 16.2 Donald Larson more.   Total dose received was 62.2 Gray in 29 fractions.  He refused further treatment after this point in time.  His treatment elapsed over 2 months with breaks that were against medical advice.   Site: Supraglottis and bilateral neck nodes /// Beams/energy:  IMRT / 6 MV photons        ==========DELIVERED PLANS==========  First Treatment Date: 2022-12-22 - Last Treatment Date: 2022-12-30   Plan Name: Lung_L_SBRT Site: Lung, Left Technique: SBRT/SRT-IMRT Mode: Photon Dose Per Fraction: 12 Gy Prescribed Dose (Delivered / Prescribed): 60 Gy / 60 Gy Prescribed Fxs (Delivered / Prescribed): 5 / 5   Plan Name: Lung_R_SBRT Site: Lung, Right Technique: SBRT/SRT-IMRT Mode: Photon Dose Per Fraction: 18 Gy Prescribed Dose  (Delivered / Prescribed): 54 Gy / 54 Gy Prescribed Fxs (Delivered / Prescribed): 3 / 3    CHIEF COMPLAINT:  Here for follow-up and surveillance of lung/ throat cancer  Narrative:       Mr. Donald Larson presents to clinic today following completion of radiation treatment for lung cancer. He completed treatment on 12-30-22. Pt will have Chest and neck CT on 03-31-23.   CT Soft Tissue Neck W Contrast 03/31/2023  IMPRESSION: No evidence of residual or recurrent disease in the neck.  NIRADS 1.   CT Chest W Contrast 03-31-23 IMPRESSION: 1. Enlarging right middle lobe pulmonary nodule currently 6 by 6 by 6 mm, previously 4 by 3 by 3 mm on 09/26/2022. This is suspicious for a metastatic lesion. 2. Reduced size and solidity of the left upper lobe nodule, currently 6 by 5 mm and previously 10 by 8 mm. Adjacent fiducial noted. Minimally reduced size of the right upper lobe nodule with adjacent fiducial noted. Mild adjacent stranding along both of these nodules may be treatment related. 3. New 5 by 2 mm left upper lobe nodule. New trace right lower lobe subpleural nodularity measuring about 2 by 3 mm. 4. Abnormal right gastric and porta hepatis adenopathy. 5. Coronary, aortic arch, and branch vessel atherosclerotic vascular disease. 6. Airway thickening is present, suggesting bronchitis or reactive airways disease. 7. Centrilobular emphysema. 8. Degenerative glenohumeral arthropathy bilaterally.   Aortic Atherosclerosis (ICD10-I70.0) and Emphysema (ICD10-J43.9).  PAIN:  Pt denies pain at this time.   RESPIRATORY: Shortness of Breath  and cough at baseline. Walking and Coughing  Dry. Pt is on room air. No skin concerns at this time.   SWALLOWING/DIET: Pt denies dysphagia . The patient eats a regular, healthy diet.. Wt Readings from Last 3 Encounters:  04/03/23 121 lb 12.8 oz (55.2 kg)  11/28/22 123 lb 4 oz (55.9 kg)  10/13/22 124 lb (56.2 kg)   OTHER: Pt complains of fatigue and  weakness. Baseline for pt. No questions or concerns at this time. States his food intake is improving and trying to maintain a good weight.   BP 139/62 (BP Location: Left Arm, Patient Position: Sitting, Cuff Size: Normal)   Pulse 74   Temp (!) 97.4 F (36.3 C)   Resp (!) 24   Ht 5\' 8"  (1.727 m)   Wt 121 lb 12.8 oz (55.2 kg)   SpO2 99%   BMI 18.52 kg/m    Vitals:   04/03/23 1412  BP: 139/62  Pulse: 74  Resp: (!) 24  Temp: (!) 97.4 F (36.3 C)  SpO2: 99%     Wt Readings from Last 3 Encounters:  04/03/23 121 lb 12.8 oz (55.2 kg)  11/28/22 123 lb 4 oz (55.9 kg)  10/13/22 124 lb (56.2 kg)          ALLERGIES:  has No Known Allergies.  Meds: Current Outpatient Medications  Medication Sig Dispense Refill   Aspirin-Caffeine (BC FAST PAIN RELIEF PO) Take 2 packets by mouth daily as needed (pain).     omeprazole (PRILOSEC) 20 MG capsule Take 20 mg by mouth daily.     Oxycodone HCl 10 MG TABS Take 10 mg by mouth every 6 (six) hours as needed (pain).     No current facility-administered medications for this encounter.    Physical Findings: The patient is in no acute distress. Patient is alert and oriented. Wt Readings from Last 3 Encounters:  04/03/23 121 lb 12.8 oz (55.2 kg)  11/28/22 123 lb 4 oz (55.9 kg)  10/13/22 124 lb (56.2 kg)    height is 5\' 8"  (1.727 m) and weight is 121 lb 12.8 oz (55.2 kg). His temperature is 97.4 F (36.3 C) (abnormal). His blood pressure is 139/62 and his pulse is 74. His respiration is 24 (abnormal) and oxygen saturation is 99%. .  General: Alert and oriented, in no acute distress HEENT: Head is normocephalic.   Neck: Neck is notable for no masses Heart: Regular in rate and rhythm   Chest: Decreased breath sounds bilaterally Lymphatics: see Neck Exam Psychiatric:  Affect is appropriate.  MSK: muscle wasting  Lab Findings: Lab Results  Component Value Date   WBC 4.9 02/02/2023   HGB 12.6 (L) 02/02/2023   HCT 36.8 (L) 02/02/2023    MCV 95.3 02/02/2023   PLT 124 (L) 02/02/2023    Lab Results  Component Value Date   TSH 7.805 (H) 04/29/2022    Radiographic Findings: CT Chest W Contrast  Result Date: 04/02/2023 CLINICAL DATA:  Supraglottic malignancy restaging. Single upper lobe pulmonary nodules demonstrating low-grade metabolic activity. * Tracking Code: BO * EXAM: CT CHEST WITH CONTRAST TECHNIQUE: Multidetector CT imaging of the chest was performed during intravenous contrast administration. RADIATION DOSE REDUCTION: This exam was performed according to the departmental dose-optimization program which includes automated exposure control, adjustment of the mA and/or kV according to patient size and/or use of iterative reconstruction technique. CONTRAST:  OMNIPAQUE IOHEXOL 300 MG/ML  SOLN  COMPARISON:  Multiple exams, including 09/26/2022 and PET-CT of 10/31/2022 FINDINGS: Cardiovascular: Coronary, aortic arch, and branch vessel atherosclerotic vascular disease. Mediastinum/Nodes: Lower para-aortic lymph node 0.9 cm in short axis on image 58 series 3, previously 0.8 cm. Lungs/Pleura: Centrilobular emphysema. 5 by 4 mm right upper lobe pulmonary nodule on image 45 series 4, previously 6 by 4 mm by my measurements on 09/26/2022. Mildly increased interstitial accentuation around this nodule, possibly therapy related. Suspected adjacent fiducial. Enlarging right middle lobe pulmonary nodule currently 6 by 6 by 6 mm, previously 4 by 3 by 3 mm on 09/26/2022. The medial airspace opacity and other reticulonodular opacity in the right middle lobe shown on 09/26/2022 has resolved. Airway thickening is present, suggesting bronchitis or reactive airways disease. Trace right lower lobe subpleural nodularity measuring about 2 by 3 mm on image 73 series 4, new from previous. Reduced size and solidity of the left upper lobe nodule, currently 6 by 5 mm on image 69 series 4 and previously 10 by 8 mm. Adjacent fiducial noted. Mild adjacent  stranding may be treatment related. New 5 by 2 mm left upper lobe nodule on image 50 series 4. Biapical pleuroparenchymal scarring, right greater than left. Upper Abdomen: Abnormal right gastric and porta hepatis adenopathy, clustered large right gastric lymph nodes measure up to about 1.4 cm in diameter. Porta hepatis lymph nodes are indistinct but a 1.4 cm portacaval node is present on image 71 series 3. Small upper periaortic lymph nodes are identified. Old granulomatous disease involving the spleen noted. Abdominal aortic atherosclerosis. Musculoskeletal: Degenerative glenohumeral arthropathy bilaterally. Subtle anterior wedging at L1 and L2 with associated spondylosis and degenerative disc disease at the L1-2 level. Lower cervical spondylosis. Schmorl's node along the superior endplate of T12. IMPRESSION: 1. Enlarging right middle lobe pulmonary nodule currently 6 by 6 by 6 mm, previously 4 by 3 by 3 mm on 09/26/2022. This is suspicious for a metastatic lesion. 2. Reduced size and solidity of the left upper lobe nodule, currently 6 by 5 mm and previously 10 by 8 mm. Adjacent fiducial noted. Minimally reduced size of the right upper lobe nodule with adjacent fiducial noted. Mild adjacent stranding along both of these nodules may be treatment related. 3. New 5 by 2 mm left upper lobe nodule. New trace right lower lobe subpleural nodularity measuring about 2 by 3 mm. 4. Abnormal right gastric and porta hepatis adenopathy. 5. Coronary, aortic arch, and branch vessel atherosclerotic vascular disease. 6. Airway thickening is present, suggesting bronchitis or reactive airways disease. 7. Centrilobular emphysema. 8. Degenerative glenohumeral arthropathy bilaterally. Aortic Atherosclerosis (ICD10-I70.0) and Emphysema (ICD10-J43.9). Electronically Signed   By: Gaylyn Rong M.D.   On: 04/02/2023 09:49   CT Soft Tissue Neck W Contrast  Result Date: 04/02/2023 CLINICAL DATA:  Supraglottic cancer. EXAM: CT NECK  WITH CONTRAST TECHNIQUE: Multidetector CT imaging of the neck was performed using the standard protocol following the bolus administration of intravenous contrast. RADIATION DOSE REDUCTION: This exam was performed according to the departmental dose-optimization program which includes automated exposure control, adjustment of the mA and/or kV according to patient size and/or use of iterative reconstruction technique. CONTRAST:  OMNIPAQUE IOHEXOL 300 MG/ML  SOLN COMPARISON:  CT neck 02/21/2021 FINDINGS: Pharynx and larynx: The nasal cavity and nasopharynx are unremarkable. The oral cavity and oropharynx are unremarkable. The parapharyngeal spaces are clear. The hypopharynx and larynx are unremarkable. The epiglottis is normal in appearance. The previously seen supraglottic mass is no longer identified. There is no retropharyngeal  collection. The airway is patent. Salivary glands: The parotid and submandibular glands are unremarkable. Thyroid: Unremarkable. Lymph nodes: There is no pathologic lymphadenopathy in the neck. Prominent left axillary lymph nodes are similar to the prior PET-CT from 10/31/2022 and were not FDG avid at that time. Vascular: There is bulky plaque at the carotid bifurcations, possible hemodynamically significant on the right. Limited intracranial: Unremarkable. Visualized orbits: Bilateral lens implants are in place. The globes and orbits are otherwise unremarkable. Mastoids and visualized paranasal sinuses: Clear. Skeleton: There is no acute osseous abnormality or suspicious osseous lesion. There is advanced disc degeneration at C5-C6 and C6-C7 with grade 1 anterolisthesis of C3 on C4 and C4 on C5, similar to the prior study. Upper chest: Assessed on the separately dictated CT chest. Other: None. IMPRESSION: No evidence of residual or recurrent disease in the neck.  NIRADS 1. Electronically Signed   By: Lesia Hausen M.D.   On: 04/02/2023 09:42    Impression/Plan:     Head and Neck  Cancer Status: CT imaging shows NED in neck/throat; Chest CT shows nodules warranting followup and adenopathy incidentally in upper abdomen. Pt denies history of colonoscopy and states he will refuse biopsies in abdomen at this time. Willing to undergo PET after the Holidays (in early 2025). Will arrange.   Thyroid function: Pt left before labwork could be obtained. Nursing arrange TSH for Jan 2025 visit  Lab Results  Component Value Date   TSH 7.805 (H) 04/29/2022    On date of service, in total, I spent 20 minutes on this encounter. Patient was seen in person. Note signed after encounter date; minutes pertain to date of service, only.  _____________________________________   Donald Peak, MD

## 2023-04-06 ENCOUNTER — Other Ambulatory Visit: Payer: Self-pay

## 2023-04-06 DIAGNOSIS — C321 Malignant neoplasm of supraglottis: Secondary | ICD-10-CM

## 2023-04-06 NOTE — Progress Notes (Signed)
TSH ordered per Dr. Basilio [place]

## 2023-04-07 ENCOUNTER — Other Ambulatory Visit: Payer: Self-pay

## 2023-04-07 NOTE — Progress Notes (Signed)
Palliative referral was previously deferred per radiation oncology team until follow up with radiation oncologist. Appointment with radiation oncology deemed no symptom management needs at this time, palliative referral closed. Palliative services available upon reorder from provider.

## 2023-04-22 ENCOUNTER — Encounter: Payer: Self-pay | Admitting: Oncology

## 2023-06-24 ENCOUNTER — Encounter (HOSPITAL_COMMUNITY): Payer: Self-pay

## 2023-06-24 ENCOUNTER — Encounter (HOSPITAL_COMMUNITY)
Admission: RE | Admit: 2023-06-24 | Discharge: 2023-06-24 | Disposition: A | Discharge status: Home / Self Care | Payer: Medicare HMO | Source: Ambulatory Visit | Attending: Radiation Oncology | Admitting: Radiation Oncology

## 2023-06-24 DIAGNOSIS — C3411 Malignant neoplasm of upper lobe, right bronchus or lung: Secondary | ICD-10-CM | POA: Insufficient documentation

## 2023-06-24 LAB — GLUCOSE, CAPILLARY: Glucose-Capillary: 90 mg/dL (ref 70–99)

## 2023-06-24 MED ORDER — FLUDEOXYGLUCOSE F - 18 (FDG) INJECTION
6.0000 | Freq: Once | INTRAVENOUS | Status: AC | PRN
  Administered 2023-06-24: 6.3 via INTRAVENOUS

## 2023-06-29 ENCOUNTER — Telehealth: Payer: Self-pay | Admitting: *Deleted

## 2023-06-29 NOTE — Telephone Encounter (Signed)
CALLED PATIENT TO INFORM OF LAB AND PET SCAN FOR 07-07-23- 2 PM- LAB @ [facility] AND HIS PET SCAN ON 07-07-23- ARRIVAL TIME- 2:30 PM @ [facility], PATIENT TO HAVE WATER ONLY- 6 HRS. PRIOR TO SCAN, PATIENT TO RECEIVE RESULTS ON 07-08-23 @ 11:20 AM FROM DR. SQUIRE, SPOKE WITH PATIENT'S GRANDSON- [name] AND HE IS AWARE OF THESE APPTS. AND THE INSTRUCTIONS

## 2023-06-30 ENCOUNTER — Ambulatory Visit: Payer: Medicare HMO

## 2023-06-30 ENCOUNTER — Ambulatory Visit: Payer: Medicare HMO | Admitting: Radiation Oncology

## 2023-07-07 ENCOUNTER — Encounter (HOSPITAL_COMMUNITY)
Admission: RE | Admit: 2023-07-07 | Discharge: 2023-07-07 | Disposition: A | Discharge status: Home / Self Care | Payer: Medicare HMO | Source: Ambulatory Visit | Attending: Radiation Oncology | Admitting: Radiation Oncology

## 2023-07-07 ENCOUNTER — Ambulatory Visit
Admission: RE | Admit: 2023-07-07 | Discharge: 2023-07-07 | Disposition: A | Discharge status: Home / Self Care | Payer: Medicare HMO | Source: Ambulatory Visit | Attending: Radiation Oncology | Admitting: Radiation Oncology

## 2023-07-07 DIAGNOSIS — F17210 Nicotine dependence, cigarettes, uncomplicated: Secondary | ICD-10-CM | POA: Diagnosis not present

## 2023-07-07 DIAGNOSIS — C3412 Malignant neoplasm of upper lobe, left bronchus or lung: Secondary | ICD-10-CM | POA: Insufficient documentation

## 2023-07-07 DIAGNOSIS — C321 Malignant neoplasm of supraglottis: Secondary | ICD-10-CM

## 2023-07-07 DIAGNOSIS — G8929 Other chronic pain: Secondary | ICD-10-CM | POA: Insufficient documentation

## 2023-07-07 DIAGNOSIS — C3411 Malignant neoplasm of upper lobe, right bronchus or lung: Secondary | ICD-10-CM | POA: Insufficient documentation

## 2023-07-07 DIAGNOSIS — Z79899 Other long term (current) drug therapy: Secondary | ICD-10-CM | POA: Insufficient documentation

## 2023-07-07 DIAGNOSIS — M5416 Radiculopathy, lumbar region: Secondary | ICD-10-CM | POA: Insufficient documentation

## 2023-07-07 LAB — GLUCOSE, CAPILLARY: Glucose-Capillary: 97 mg/dL (ref 70–99)

## 2023-07-07 LAB — TSH: TSH: 20.089 u[IU]/mL — ABNORMAL HIGH (ref 0.350–4.500)

## 2023-07-07 MED ORDER — FLUDEOXYGLUCOSE F - 18 (FDG) INJECTION: 6.2000 | Freq: Once | INTRAVENOUS | Status: DC

## 2023-07-08 ENCOUNTER — Ambulatory Visit
Admission: RE | Admit: 2023-07-08 | Discharge: 2023-07-08 | Disposition: A | Discharge status: Home / Self Care | Payer: Medicare HMO | Source: Ambulatory Visit | Attending: Radiation Oncology | Admitting: Radiation Oncology

## 2023-07-08 ENCOUNTER — Other Ambulatory Visit: Payer: Self-pay | Admitting: Radiation Oncology

## 2023-07-08 ENCOUNTER — Encounter: Payer: Self-pay | Admitting: Radiation Oncology

## 2023-07-08 VITALS — BP 123/78 | HR 76 | Temp 97.3°F | Resp 18 | Ht 68.0 in | Wt 122.5 lb

## 2023-07-08 DIAGNOSIS — C321 Malignant neoplasm of supraglottis: Secondary | ICD-10-CM

## 2023-07-08 DIAGNOSIS — C342 Malignant neoplasm of middle lobe, bronchus or lung: Secondary | ICD-10-CM | POA: Insufficient documentation

## 2023-07-08 DIAGNOSIS — C3411 Malignant neoplasm of upper lobe, right bronchus or lung: Secondary | ICD-10-CM

## 2023-07-08 DIAGNOSIS — C3412 Malignant neoplasm of upper lobe, left bronchus or lung: Secondary | ICD-10-CM

## 2023-07-08 LAB — T4, FREE: Free T4: 0.91 ng/dL (ref 0.61–1.12)

## 2023-07-08 NOTE — Progress Notes (Addendum)
Donald Larson is here for a follow up for completing radiation treatment for lung cancer. His last treatment was on 12/30/2022.He had a chest and neck CT scan on 03/31/2023.    Does the patient complain of any of the following: Pain: None Shortness of breath w/wo exertion: None Cough: Yes Hemoptysis: None Pain with swallowing: None Swallowing/choking concerns: None Appetite: Patients appetite has been low. Encouraged to eat high calorie foods. Energy Level: Patients energy level is low and has been experiencing fatigue Post radiation skin Changes: None  Additional comments if applicable: None   BP 123/78 (BP Location: Left Arm, Patient Position: Sitting)   Pulse 76   Temp (!) 97.3 F (36.3 C) (Temporal)   Resp 18   Ht 5\' 8"  (1.727 m)   Wt 122 lb 8 oz (55.6 kg)   SpO2 99%   BMI 18.63 kg/m     Filed Weights   07/08/23 1134  Weight: 122 lb 8 oz (55.6 kg)

## 2023-07-08 NOTE — Progress Notes (Addendum)
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 Malignant neoplasm of right upper lobe of lung (HCC)  C34.11     4. Malignant neoplasm of middle lobe of right lung [facility])  C34.2        Cancer Staging  Cancer of supraglottis [facility]) Staging form: Larynx - Supraglottis, AJCC 8th Edition - Clinical stage from 04/30/2021: Stage IVA (cT1, cN2a, cM0) - Signed by Lonie Peak, MD on 04/30/2021 Stage prefix: Initial diagnosis       Radiation Treatment Dates: 05-21-21 to 07-23-21   Dose: The patient initially was treated at 2 Gray per fraction.  He received 46 Gray in 23 fractions.  He then expressed reluctance to complete treatment.  He did not show for appointments as scheduled. Therefore upon further discussion with Dr. Basilio Cairo, he agreed to continue treatment at a hypofractionated technique so that he could come in for less visits.  He tolerated an additional 6 fractions at 2.7 Gray per fraction to yield 16.2 Wallace Cullens more.   Total dose received was 62.2 Gray in 29 fractions.  He refused further treatment after this point in time.  His treatment elapsed over 2 months with breaks that were against medical advice.   Site: Supraglottis and bilateral neck nodes /// Beams/energy:  IMRT / 6 MV photons     ==========DELIVERED PLANS==========  First Treatment Date: 2022-12-22 - Last Treatment Date: 2022-12-30   Plan Name: Lung_L_SBRT Site: Lung, Left Technique: SBRT/SRT-IMRT Mode: Photon Dose Per Fraction: 12 Gy Prescribed Dose (Delivered / Prescribed): 60 Gy / 60 Gy Prescribed Fxs (Delivered / Prescribed): 5 / 5   Plan Name: Lung_R_SBRT Site: Lung,  Right Technique: SBRT/SRT-IMRT Mode: Photon Dose Per Fraction: 18 Gy Prescribed Dose (Delivered / Prescribed): 54 Gy / 54 Gy Prescribed Fxs (Delivered / Prescribed): 3 / 3    CHIEF COMPLAINT:  Here for follow-up and surveillance of lung/ throat cancer  Narrative:     The patient comes in today unaccompanied to review the results to his PET scan/  Does the patient complain of any of the following: Pain: None Shortness of breath w/wo exertion: None Cough: Yes Hemoptysis: None Pain with swallowing: None Swallowing/choking concerns: None Appetite: Patients appetite has been low. Encouraged to eat high calorie foods. Energy Level: Patients energy level is low and has been experiencing fatigue Post radiation skin Changes: None  Additional comments if applicable: Weight has been relatively stable over the past several months Wt Readings from Last 3 Encounters:  07/08/23 122 lb 8 oz (55.6 kg)  04/03/23 121 lb 12.8 oz (55.2 kg)  11/28/22 123 lb 4 oz (55.9 kg)       ALLERGIES:  has no known allergies.  Meds: Current Outpatient Medications  Medication Sig Dispense Refill   Aspirin-Caffeine (BC FAST PAIN RELIEF PO) Take 2 packets by mouth daily as needed (pain).     omeprazole (PRILOSEC) 20 MG capsule Take 20 mg by mouth daily.     Oxycodone HCl 10 MG TABS Take 10 mg by mouth every 6 (six) hours as needed (pain).  No current facility-administered medications for this encounter.   Facility-Administered Medications Ordered in Other Encounters  Medication Dose Route Frequency Provider Last Rate Last Admin   fludeoxyglucose F - 18 (FDG) injection 6.2 millicurie  6.2 millicurie Intravenous Once Roque Lias, MD        Physical Findings: The patient is in no acute distress. Patient is alert and oriented. Wt Readings from Last 3 Encounters:  07/08/23 122 lb 8 oz (55.6 kg)  04/03/23 121 lb 12.8 oz (55.2 kg)  11/28/22 123 lb 4 oz (55.9 kg)    height is 5\' 8"  (1.727 m) and weight  is 122 lb 8 oz (55.6 kg). His temporal temperature is 97.3 F (36.3 C) (abnormal). His blood pressure is 123/78 and his pulse is 76. His respiration is 18 and oxygen saturation is 99%. .  General: Alert and oriented, in no acute distress HEENT: Head is normocephalic.   Neck: Neck is notable for no masses Heart: Regular in rate and rhythm   Chest: Decreased breath sounds bilaterally Lymphatics: see Neck Exam Psychiatric:  Affect is appropriate.  MSK: muscle wasting, ambulatory.  There is a subcutaneous 3 to 4 cm soft tissue mass over the right deltoid that is mobile and soft, consistent with lipoma.  Patient states he has had it " forever"  Lab Findings: Lab Results  Component Value Date   WBC 4.9 02/02/2023   HGB 12.6 (L) 02/02/2023   HCT 36.8 (L) 02/02/2023   MCV 95.3 02/02/2023   PLT 124 (L) 02/02/2023    Lab Results  Component Value Date   TSH 20.089 (H) 07/07/2023    Radiographic Findings: NM PET Image Restag (PS) Skull Base To Thigh Result Date: 07/08/2023 CLINICAL DATA:  Subsequent treatment strategy for non-small cell lung cancer. EXAM: NUCLEAR MEDICINE PET SKULL BASE TO THIGH TECHNIQUE: 6.1 mCi F-18 FDG was injected intravenously. Full-ring PET imaging was performed from the skull base to thigh after the radiotracer. CT data was obtained and used for attenuation correction and anatomic localization. Fasting blood glucose: Ninety-seven mg/dl COMPARISON:  16/03/9603 FINDINGS: Mediastinal blood pool activity: SUV max 2.1 Liver activity: SUV max NA NECK: No significant abnormal hypermetabolic activity in this region. Incidental CT findings: Bilateral common carotid atheromatous vascular calcification. CHEST: The enlarging right middle lobe pulmonary nodule of concern from the 03/31/2023 CT exam has a maximum SUV of 3.8, compatible with malignancy. A right infrahilar lymph node or nodule with short axis of about 0.8 cm on image 85 series 4 has maximum SUV of 9.2, this is new compared  to the prior PET-CT and is compatible with malignancy. 6 by 3 mm left upper lobe nodule on image 20 series 7 (previously 5 by 2 mm on 03/31/2023), maximum SUV 1.1, but below sensitive PET-CT size thresholds. Only hazy nodularity in the vicinity of the left upper lobe fiducial, maximum SUV 1.2, favoring treated lesion. Newly appreciable peribronchovascular nodule in the right lower lobe measuring about 4 by 3 mm on image 52 series 7, maximum SUV 2.0, concerning for potential malignancy. Hazy interstitial accentuation with central fiducial in the right upper lobe, maximum SUV 1.5 likely reflecting post treatment related findings. Incidental CT findings: Emphysema. Coronary, aortic arch, and branch vessel atherosclerotic vascular disease. ABDOMEN/PELVIS: Low-grade metabolic activity along the stomach antrum, maximum SUV 3.4 and previously 4.0, nonspecific although probably physiologic. Incidental CT findings: Atherosclerosis is present, including aortoiliac atherosclerotic disease. Atheromatous plaque noted proximally in the superior mesenteric artery. Sigmoid colon diverticulosis. Mild prominence of rectal stool. Notable  pelvic lymph nodes including a 1.4 cm right external iliac node on image 169 series 4 (maximum SUV 2.0), a 1.6 cm left external iliac lymph node on image 168 series 4 (maximum SUV 1.6), and a 1.5 cm right inguinal lymph node on image 176 series 4 (maximum SUV 1.7). The less than blood pool activity of these lymph nodes favors a benign or reactive etiology, in the lymph nodes were similar-sized previously. Likewise there is mild but non hypermetabolic retroperitoneal adenopathy. SKELETON: No significant abnormal hypermetabolic activity in this region. Incidental CT findings: Cervical and lumbar spondylosis and degenerative disc disease. IMPRESSION: 1. The enlarging right middle lobe pulmonary nodule of concern from the 03/31/2023 CT exam has a maximum SUV of 3.8, compatible with malignancy. 2. A right  infrahilar lymph node or nodule with short axis of about 0.8 cm has maximum SUV of 9.2, new compared to the prior PET-CT and is compatible with malignancy. 3. Newly appreciable peribronchovascular nodule in the right lower lobe measuring about 4 by 3 mm, maximum SUV 2.0, concerning for potential malignancy given the activity relative to the small size. 4. 6 by 3 mm left upper lobe nodule (previously 5 by 2 mm on 03/31/2023), maximum SUV 1.1, but below sensitive PET-CT size thresholds. Surveillance recommended. 5. Hazy interstitial accentuation with central fiducial in the right upper lobe, maximum SUV 1.5 likely reflecting post treatment related findings. 6. Stable mildly enlarged pelvic and retroperitoneal lymph nodes with less than blood pool activity, favoring a benign or reactive etiology. 7. Emphysema. 8. Coronary, aortic arch, and branch vessel atherosclerotic vascular disease. 9. Sigmoid colon diverticulosis. 10. Mild prominence of rectal stool. 11. Cervical and lumbar spondylosis and degenerative disc disease. Aortic Atherosclerosis (ICD10-I70.0) and Emphysema (ICD10-J43.9). Electronically Signed   By: Gaylyn Rong M.D.   On: 07/08/2023 09:19    Impression/Plan:    Previous lung SBRT in 12/2022:          This is a very pleasant 78 year old gentleman with a history of head and neck cancer as well as bilateral lung cancers.  He has received radiation to all sites.  The lung lesions above do not appear to be active after SBRT.  However he does have a growing nodule in the peripheral right middle lobe of his lung that is highly suspicious for cancer.  He also has a subcentimeter hilar lymph node that is hypermetabolic and suspicious.  I talked to the patient about potentially biopsying both sites.  He is not interested in biopsies at this time.  We again talked about his goals of care and he would like to get treatment but not necessarily go through a rigorous workup or highly invasive  treatments that could cause significant side effects and affect his quality of life.  Given that the lymph node in the hilum is not suspicious enough to treat empirically without biopsy, I recommended that we treat him with SBRT to the right middle lobe lung tumor and then restage him with another scan approximately 3 months after radiation.  If the lymph node is growing or behaving suspiciously we can then decide whether to treat it or biopsy it.  He is pleased with this plan.  We will get him scheduled in the near future for radiation planning and 3-5 fractions of SBRT.  Consent form was signed and placed in his chart today after thorough discussion of the risks benefits and side effects of radiation which may include but not necessarily be limited to skin irritation, fatigue, rib  fracture, injury to lung or other intrathoracic organs.   Thyroid function: TSH is elevated today.  Reflex Free T4 pending.  Anticipate we will need to start him on levothyroxine.  Lab Results  Component Value Date   TSH 20.089 (H) 07/07/2023     I called his grandson, Mr. Tami Ribas, to touch base about all the plans above.  Voicemail left with contact information asking him to return my call  On date of service, in total, I spent 40 minutes on this encounter. Patient was seen in person.    _____________________________________   Lonie Peak, MD

## 2023-07-13 ENCOUNTER — Other Ambulatory Visit: Payer: Self-pay | Admitting: Radiation Oncology

## 2023-07-13 ENCOUNTER — Other Ambulatory Visit: Payer: Self-pay

## 2023-07-13 DIAGNOSIS — E038 Other specified hypothyroidism: Secondary | ICD-10-CM

## 2023-07-13 DIAGNOSIS — C321 Malignant neoplasm of supraglottis: Secondary | ICD-10-CM

## 2023-07-13 DIAGNOSIS — C3412 Malignant neoplasm of upper lobe, left bronchus or lung: Secondary | ICD-10-CM

## 2023-07-13 MED ORDER — LEVOTHYROXINE SODIUM 25 MCG PO TABS: ORAL_TABLET | ORAL | 3 refills | Status: DC

## 2023-07-13 NOTE — Progress Notes (Signed)
I spoke w/ pt's grandson [name] today to let him know about Rx for levothyroxine and how his granddad needs to take it daily.  I also notified our staff how to best reach Mr Tami Ribas for radiation planning scheduling. -----------------------------------  Lonie Peak, MD

## 2023-07-22 ENCOUNTER — Ambulatory Visit
Admission: RE | Admit: 2023-07-22 | Discharge: 2023-07-22 | Disposition: A | Discharge status: Home / Self Care | Payer: Medicare HMO | Source: Ambulatory Visit | Attending: Radiation Oncology | Admitting: Radiation Oncology

## 2023-07-22 ENCOUNTER — Inpatient Hospital Stay: Payer: Medicare HMO | Attending: Internal Medicine

## 2023-07-22 DIAGNOSIS — F17210 Nicotine dependence, cigarettes, uncomplicated: Secondary | ICD-10-CM | POA: Diagnosis not present

## 2023-07-22 DIAGNOSIS — C3411 Malignant neoplasm of upper lobe, right bronchus or lung: Secondary | ICD-10-CM | POA: Insufficient documentation

## 2023-07-22 DIAGNOSIS — C3412 Malignant neoplasm of upper lobe, left bronchus or lung: Secondary | ICD-10-CM | POA: Insufficient documentation

## 2023-07-22 DIAGNOSIS — Z79899 Other long term (current) drug therapy: Secondary | ICD-10-CM | POA: Diagnosis not present

## 2023-07-29 DIAGNOSIS — C3411 Malignant neoplasm of upper lobe, right bronchus or lung: Secondary | ICD-10-CM | POA: Diagnosis not present

## 2023-08-03 ENCOUNTER — Inpatient Hospital Stay: Payer: Medicare HMO

## 2023-08-03 ENCOUNTER — Ambulatory Visit: Payer: Medicare HMO | Admitting: Radiation Oncology

## 2023-08-03 ENCOUNTER — Ambulatory Visit: Payer: Medicare HMO

## 2023-08-04 ENCOUNTER — Ambulatory Visit: Payer: Medicare HMO | Admitting: Radiation Oncology

## 2023-08-05 ENCOUNTER — Ambulatory Visit: Payer: Medicare HMO | Admitting: Radiation Oncology

## 2023-08-05 ENCOUNTER — Inpatient Hospital Stay: Payer: Medicare HMO

## 2023-08-06 ENCOUNTER — Ambulatory Visit: Payer: Medicare HMO | Admitting: Radiation Oncology

## 2023-08-07 ENCOUNTER — Inpatient Hospital Stay: Payer: Medicare HMO

## 2023-08-07 ENCOUNTER — Ambulatory Visit
Admission: RE | Admit: 2023-08-07 | Discharge: 2023-08-07 | Disposition: A | Discharge status: Home / Self Care | Payer: Medicare HMO | Source: Ambulatory Visit | Attending: Radiation Oncology | Admitting: Radiation Oncology

## 2023-08-07 ENCOUNTER — Ambulatory Visit: Payer: Medicare HMO | Admitting: Radiation Oncology

## 2023-08-07 ENCOUNTER — Other Ambulatory Visit: Payer: Self-pay

## 2023-08-07 DIAGNOSIS — C3411 Malignant neoplasm of upper lobe, right bronchus or lung: Secondary | ICD-10-CM | POA: Diagnosis not present

## 2023-08-07 LAB — RAD ONC ARIA SESSION SUMMARY
Course Elapsed Days: 0
Plan Fractions Treated to Date: 1
Plan Prescribed Dose Per Fraction: 18 Gy
Plan Total Fractions Prescribed: 3
Plan Total Prescribed Dose: 54 Gy
Reference Point Dosage Given to Date: 18 Gy
Reference Point Session Dosage Given: 18 Gy
Session Number: 1

## 2023-08-10 ENCOUNTER — Other Ambulatory Visit: Payer: Self-pay

## 2023-08-10 ENCOUNTER — Ambulatory Visit
Admission: RE | Admit: 2023-08-10 | Discharge: 2023-08-10 | Disposition: A | Discharge status: Home / Self Care | Payer: Medicare HMO | Source: Ambulatory Visit | Attending: Radiation Oncology

## 2023-08-10 ENCOUNTER — Ambulatory Visit
Admission: RE | Admit: 2023-08-10 | Discharge: 2023-08-10 | Disposition: A | Discharge status: Home / Self Care | Payer: Medicare HMO | Source: Ambulatory Visit | Attending: Radiation Oncology | Admitting: Radiation Oncology

## 2023-08-10 DIAGNOSIS — C3412 Malignant neoplasm of upper lobe, left bronchus or lung: Secondary | ICD-10-CM | POA: Diagnosis present

## 2023-08-10 DIAGNOSIS — C3411 Malignant neoplasm of upper lobe, right bronchus or lung: Secondary | ICD-10-CM | POA: Insufficient documentation

## 2023-08-10 DIAGNOSIS — Z79899 Other long term (current) drug therapy: Secondary | ICD-10-CM | POA: Diagnosis not present

## 2023-08-10 DIAGNOSIS — F17210 Nicotine dependence, cigarettes, uncomplicated: Secondary | ICD-10-CM | POA: Diagnosis not present

## 2023-08-10 DIAGNOSIS — C321 Malignant neoplasm of supraglottis: Secondary | ICD-10-CM

## 2023-08-10 LAB — RAD ONC ARIA SESSION SUMMARY
Course Elapsed Days: 3
Plan Fractions Treated to Date: 2
Plan Prescribed Dose Per Fraction: 18 Gy
Plan Total Fractions Prescribed: 3
Plan Total Prescribed Dose: 54 Gy
Reference Point Dosage Given to Date: 36 Gy
Reference Point Session Dosage Given: 18 Gy
Session Number: 2

## 2023-08-11 ENCOUNTER — Ambulatory Visit: Payer: Medicare HMO | Admitting: Radiation Oncology

## 2023-08-12 ENCOUNTER — Ambulatory Visit: Admission: RE | Admit: 2023-08-12 | Payer: Medicare HMO | Source: Ambulatory Visit | Admitting: Radiation Oncology

## 2023-08-13 ENCOUNTER — Ambulatory Visit: Payer: Medicare HMO | Admitting: Radiation Oncology

## 2023-08-14 ENCOUNTER — Other Ambulatory Visit: Payer: Self-pay

## 2023-08-14 ENCOUNTER — Ambulatory Visit
Admission: RE | Admit: 2023-08-14 | Discharge: 2023-08-14 | Disposition: A | Discharge status: Home / Self Care | Source: Ambulatory Visit | Attending: Radiation Oncology | Admitting: Radiation Oncology

## 2023-08-14 DIAGNOSIS — C3411 Malignant neoplasm of upper lobe, right bronchus or lung: Secondary | ICD-10-CM | POA: Diagnosis not present

## 2023-08-14 LAB — RAD ONC ARIA SESSION SUMMARY
Course Elapsed Days: 7
Plan Fractions Treated to Date: 3
Plan Prescribed Dose Per Fraction: 18 Gy
Plan Total Fractions Prescribed: 3
Plan Total Prescribed Dose: 54 Gy
Reference Point Dosage Given to Date: 54 Gy
Reference Point Session Dosage Given: 18 Gy
Session Number: 3

## 2023-08-17 NOTE — Radiation Completion Notes (Signed)
 Patient Name: Donald Larson, Donald Larson MRN: [id number] Date of Birth: 11-Nov-1945 Referring Physician: Lerry Liner, M.D. Date of Service: 2023-08-17 Radiation Oncologist: Lonie Peak, M.D. [facility]                             RADIATION ONCOLOGY END OF TREATMENT NOTE     Diagnosis: C34.2 Malignant neoplasm of middle lobe, bronchus or lung Staging on 2021-04-30: Cancer of supraglottis (HCC) T=cT1, N=cN2a, M=cM0 Intent: Curative     ==========DELIVERED PLANS==========  First Treatment Date: 2023-08-07 Last Treatment Date: 2023-08-14   Plan Name: MidLbe_R_SBRT Site: Lung, Right Technique: SBRT/SRT-IMRT Mode: Photon Dose Per Fraction: 18 Gy Prescribed Dose (Delivered / Prescribed): 54 Gy / 54 Gy Prescribed Fxs (Delivered / Prescribed): 3 / 3     ==========ON TREATMENT VISIT DATES========== 2023-08-07, 2023-08-10, 2023-08-10, 2023-08-14     ==========UPCOMING VISITS==========       ==========APPENDIX - ON TREATMENT VISIT NOTES==========   See weekly On Treatment Notes in Epic for details in the Media tab (listed as Progress notes on the On Treatment Visit Dates listed above).

## 2023-11-18 IMAGING — CT NM PET TUM IMG RESTAG (PS) SKULL BASE T - THIGH
6 series · 25 of 25 positions shown · non-contrast
Comparison: 04/29/2021

CLINICAL DATA: Subsequent treatment strategy for restaging of head
neck cancer. Diagnosed [DATE]. Radiation therapy and chemotherapy
complete.

EXAM:
NUCLEAR MEDICINE PET SKULL BASE TO THIGH
TECHNIQUE: 5.9 mCi F-18 FDG was injected intravenously. Full-ring PET imaging
was performed from the skull base to thigh after the radiotracer. CT
data was obtained and used for attenuation correction and anatomic
localization.
Fasting blood glucose: 106 mg/dl

[Series 3: pet sk_thigh ac · axial · 5.0mm · 4.07mm/px · z∈[-787,+109]mm · 5 of 225 slices shown]
[im 1/225]
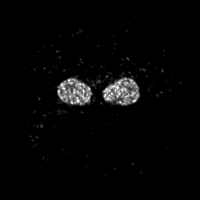
[im 57/225]
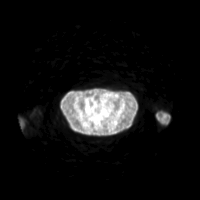
[im 113/225]
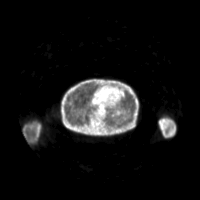
[im 169/225]
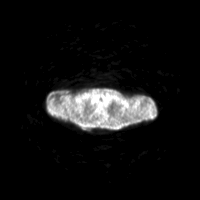
[im 225/225]
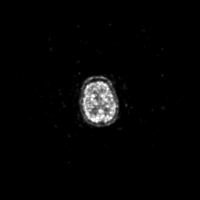

[Series 4: ct sk_thigh 5.0 br38 · axial · 5.0mm · 0.98mm/px · z∈[-787,+109]mm · 6 of 225 slices shown]
[im 1/225]
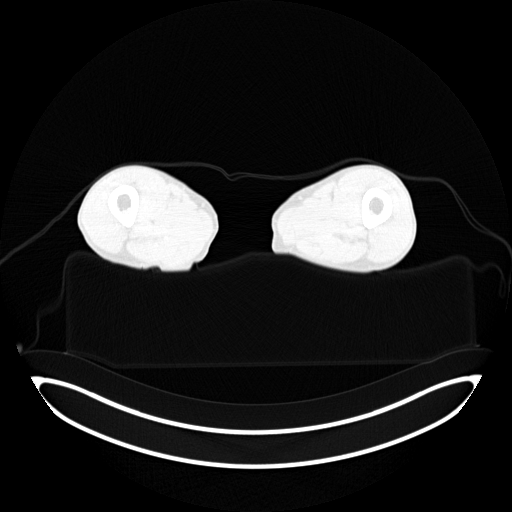
[im 45/225]
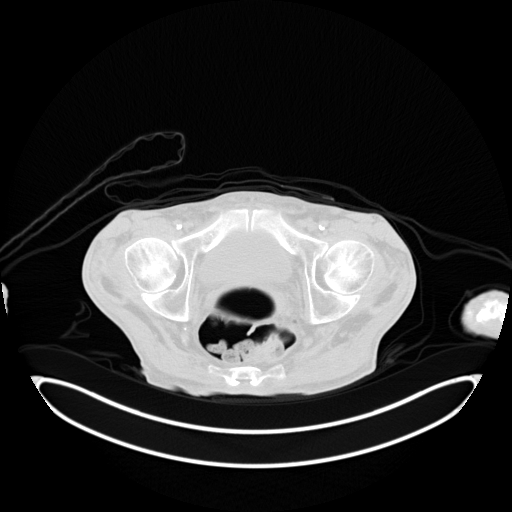
[im 90/225]
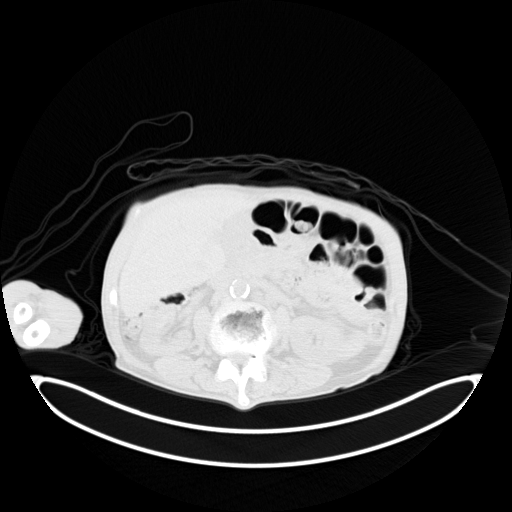
[im 135/225]
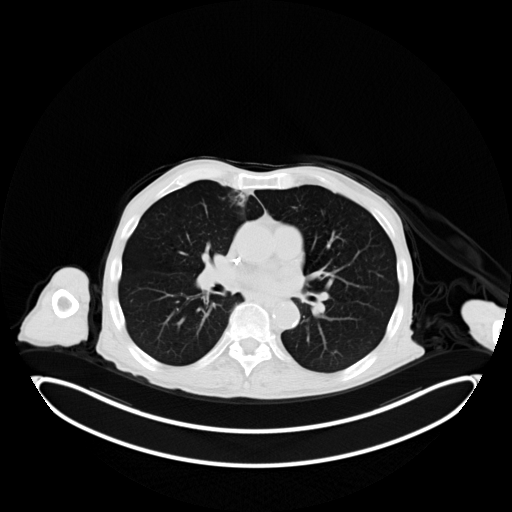
[im 180/225  brain]
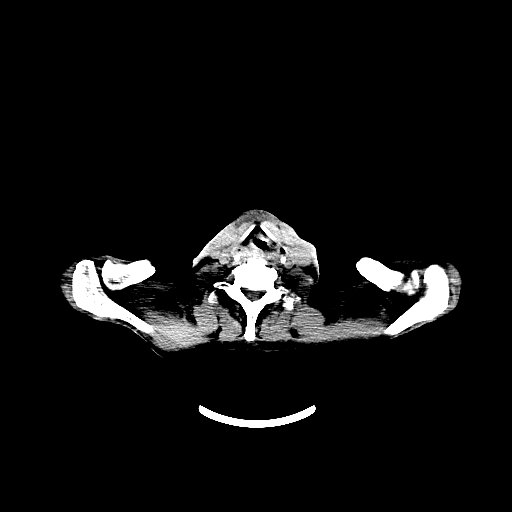
[im 225/225  brain]
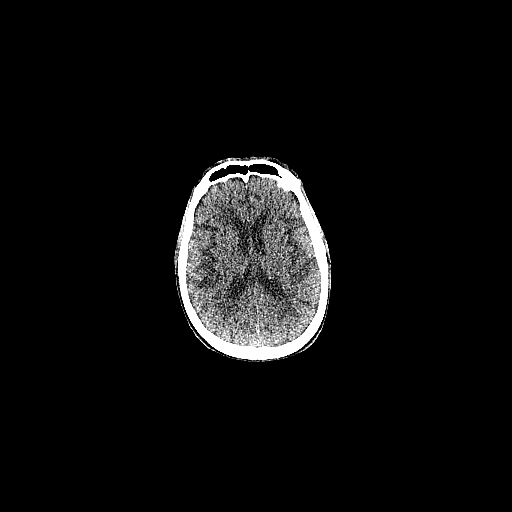

[Series 5: pet sk_thigh nac · axial · 5.0mm · 4.07mm/px · z∈[-787,+109]mm · 6 of 225 slices shown]
[im 1/225]
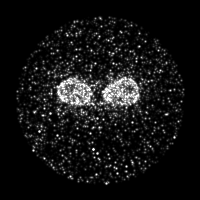
[im 45/225]
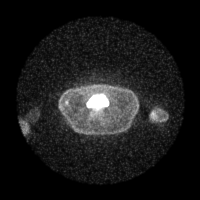
[im 90/225]
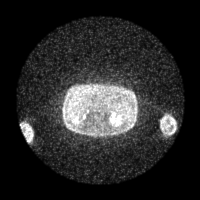
[im 135/225]
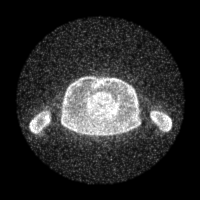
[im 180/225]
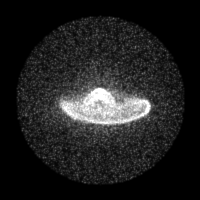
[im 225/225]
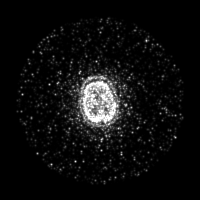

[Series 605: fused cor · 1 of 20 slices shown]
[im 1/20]
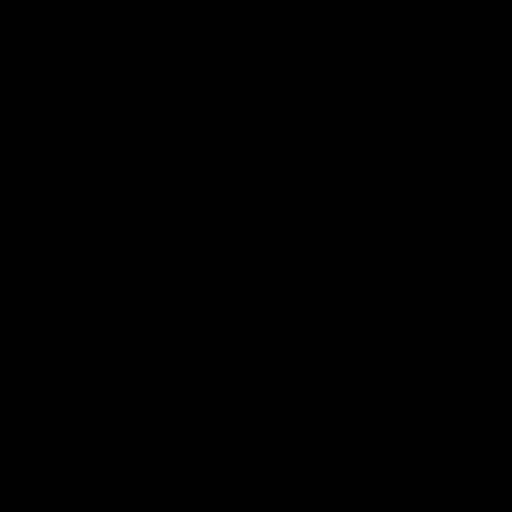

[Series 606: mip pet · coronal · 1.86mm/px · 1 of 32 slices shown]
[im 1/32]
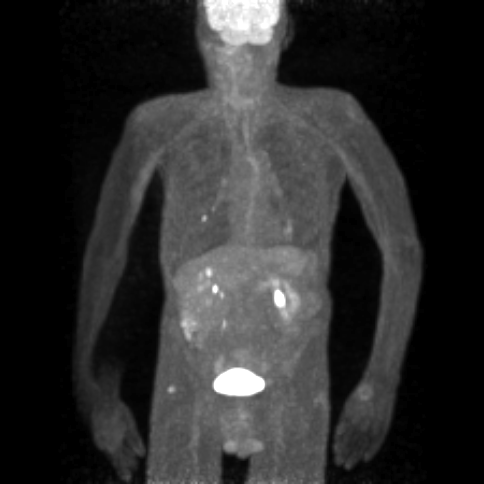

[Series 607: fused tra · 6 of 218 slices shown]
[im 1/218]
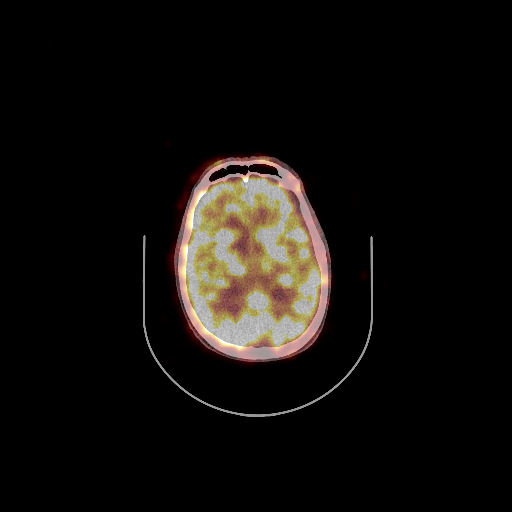
[im 44/218]
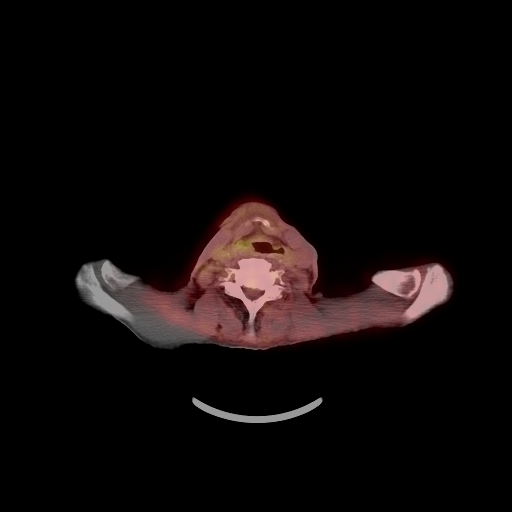
[im 87/218]
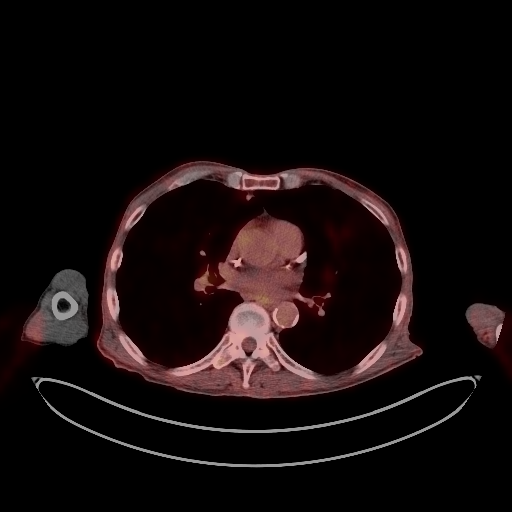
[im 131/218]
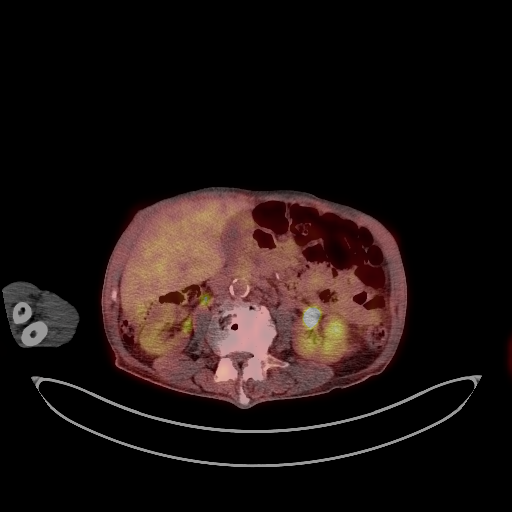
[im 174/218]
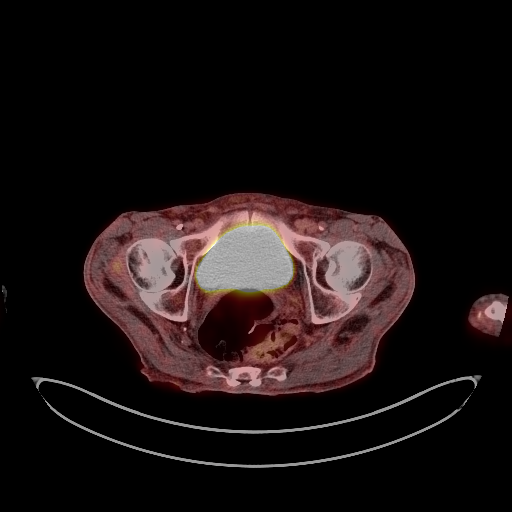
[im 218/218]
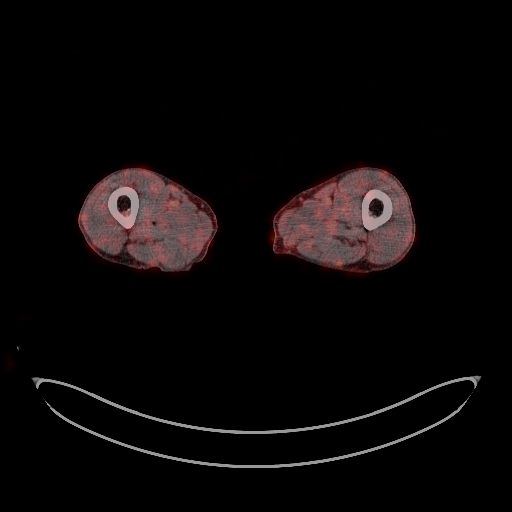

[25 of 25 positions shown; findings below may reference images not displayed]

FINDINGS: Mediastinal blood pool activity: SUV max

Liver activity: SUV max NA

NECK: The right epiglottic hypermetabolic mass is no longer
identified. There is residual soft tissue thickening in this area
which could be treatment related including on 43/4.

The right level 2 node is difficult to delineate on CT images. The
hypermetabolism in this area is markedly decreased, including at a
S.U.V. max of 2.5 on 36/4. Compare a S.U.V. max of 10.9 on the prior
exam. No new cervical nodal hypermetabolism.

Incidental CT findings: Bilateral carotid atherosclerosis.

CHEST: Minimal right upper lobe nodularity corresponding to
low-level activity at a S.U.V. max of 1.1 on 67/4, likely new.

The posterior left upper lobe pulmonary nodule detailed on the prior
exam measures 7 mm and a S.U.V. max of 1.7 on 78/4. Similar in size
and a S.U.V. max of 1.1 on the prior exam.

Right lower lobe endobronchial hypermetabolic focus measures 7 mm
and a S.U.V. max of 5.0 on 102/4, favored to represent mucoid
impaction.

Incidental CT findings: Centrilobular emphysema. Right greater than
left lower lobe and lingular peribronchovascular nodularity, new.
Example 109/4. left Port-A-Cath tip high right atrium. Aortic and
coronary artery calcification.

ABDOMEN/PELVIS: No abdominopelvic parenchymal or nodal
hypermetabolism.

Incidental CT findings: Normal noncontrast appearance of the liver,
stomach, pancreas, gallbladder, adrenal glands, left kidney.
Subcentimeter interpolar right renal low-density lesion is likely a
cyst. Abdominal aortic atherosclerosis. Normal prostate. No bowel
obstruction. Prominent abdominal retroperitoneal nodes are similar.
Right inguinal node of 1.3 cm on 185/4 is unchanged and not
hypermetabolic.

SKELETON: No abnormal marrow activity.

Incidental CT findings: none
IMPRESSION: 1. Response to therapy of right sided epiglottic primary and level
2A nodal metastasis, as detailed above.
2. Development of peribronchovascular nodularity throughout both
lungs, suspicious for infectious bronchiolitis versus (given the
history of head neck primary) aspiration.
3. The left upper lobe pulmonary nodule is similar in size and
demonstrates nonspecific slightly increased hypermetabolism.
Recommend attention on follow-up.
4. Right lower lobe endobronchial nodular focus with
hypermetabolism, likely an area of mucoid impaction.
5. Possible tiny right upper lobe pulmonary nodule with low-level
hypermetabolism, new. Recommend attention on follow-up.
6. No findings of distant metastatic disease.
7. Aortic atherosclerosis ([id number]), coronary artery
atherosclerosis and emphysema ([id number].S).

## 2023-11-27 ENCOUNTER — Ambulatory Visit (HOSPITAL_COMMUNITY)
Admission: RE | Admit: 2023-11-27 | Discharge: 2023-11-27 | Disposition: A | Discharge status: Home / Self Care | Source: Ambulatory Visit | Attending: Radiation Oncology | Admitting: Radiation Oncology

## 2023-11-27 ENCOUNTER — Encounter (HOSPITAL_COMMUNITY): Payer: Self-pay

## 2023-11-27 ENCOUNTER — Encounter (HOSPITAL_COMMUNITY)
Admission: RE | Admit: 2023-11-27 | Discharge: 2023-11-27 | Disposition: A | Discharge status: Home / Self Care | Source: Ambulatory Visit | Attending: Radiation Oncology | Admitting: Radiation Oncology

## 2023-11-27 DIAGNOSIS — C321 Malignant neoplasm of supraglottis: Secondary | ICD-10-CM

## 2023-11-27 DIAGNOSIS — C3412 Malignant neoplasm of upper lobe, left bronchus or lung: Secondary | ICD-10-CM | POA: Diagnosis present

## 2023-11-27 LAB — GLUCOSE, CAPILLARY: Glucose-Capillary: 93 mg/dL (ref 70–99)

## 2023-11-27 MED ORDER — FLUDEOXYGLUCOSE F - 18 (FDG) INJECTION
6.1000 | Freq: Once | INTRAVENOUS | Status: AC
  Administered 2023-11-27: 6.1 via INTRAVENOUS

## 2023-12-01 ENCOUNTER — Ambulatory Visit: Payer: Medicare HMO

## 2023-12-01 ENCOUNTER — Telehealth: Payer: Self-pay | Admitting: *Deleted

## 2023-12-01 ENCOUNTER — Ambulatory Visit: Payer: Self-pay | Admitting: Radiation Oncology

## 2023-12-01 NOTE — Telephone Encounter (Signed)
 CALLED PATIENT TO REMIND OF LAB AND FU FOR 12-02-23, SPOKE WITH PATIENT AND HE IS AWARE OF THESE APPTS.

## 2023-12-02 ENCOUNTER — Ambulatory Visit
Admission: RE | Admit: 2023-12-02 | Discharge: 2023-12-02 | Disposition: A | Discharge status: Home / Self Care | Source: Ambulatory Visit | Attending: Radiation Oncology | Admitting: Radiation Oncology

## 2023-12-02 ENCOUNTER — Ambulatory Visit
Admission: RE | Admit: 2023-12-02 | Discharge: 2023-12-02 | Disposition: A | Discharge status: Home / Self Care | Payer: Self-pay | Source: Ambulatory Visit | Attending: Radiation Oncology | Admitting: Radiation Oncology

## 2023-12-02 VITALS — BP 178/79 | HR 58 | Temp 97.6°F | Resp 20 | Ht 68.0 in | Wt 120.0 lb

## 2023-12-02 DIAGNOSIS — C3412 Malignant neoplasm of upper lobe, left bronchus or lung: Secondary | ICD-10-CM | POA: Insufficient documentation

## 2023-12-02 DIAGNOSIS — Z79899 Other long term (current) drug therapy: Secondary | ICD-10-CM | POA: Diagnosis not present

## 2023-12-02 DIAGNOSIS — R946 Abnormal results of thyroid function studies: Secondary | ICD-10-CM | POA: Diagnosis not present

## 2023-12-02 DIAGNOSIS — C3411 Malignant neoplasm of upper lobe, right bronchus or lung: Secondary | ICD-10-CM

## 2023-12-02 DIAGNOSIS — C321 Malignant neoplasm of supraglottis: Secondary | ICD-10-CM | POA: Insufficient documentation

## 2023-12-02 DIAGNOSIS — C342 Malignant neoplasm of middle lobe, bronchus or lung: Secondary | ICD-10-CM

## 2023-12-02 LAB — TSH: TSH: 12.2 u[IU]/mL — ABNORMAL HIGH (ref 0.350–4.500)

## 2023-12-02 NOTE — Progress Notes (Signed)
 Mr. Goetze presents in the clinic for a follow up and to review PET scan results. He completed radiation therapy for Malignant Neoplasm of the lung on 08/14/2023.  PET Scan on 11/27/2023  CT Chest Wo Contrast on 11/27/2023     PAIN: He states he has generalized pain. Takes prescribed pain medications   RESPIRATORY: Patient has an intermittent dry cough. Pt is on room air. Noted ecchymoses - generalized Bruise Easily.  SWALLOWING/DIET: Pt denies dysphagia. Patient hasn't had much of an appetite. Does eat twice a day, breakfast and lunch, skips dinner. Encouraged to drink ensure/protein drinks.  OTHER: Pt complains of fatigue and poor appetite. BP (!) 178/79 (BP Location: Right Arm, Patient Position: Sitting, Cuff Size: Normal)   Pulse (!) 58   Temp 97.6 F (36.4 C)   Resp 20   Ht 5' 8 (1.727 m)   Wt 120 lb (54.4 kg)   SpO2 97%   BMI 18.25 kg/m   Wt Readings from Last 3 Encounters:  12/02/23 120 lb (54.4 kg)  07/08/23 122 lb 8 oz (55.6 kg)  04/03/23 121 lb 12.8 oz (55.2 kg)

## 2023-12-02 NOTE — Progress Notes (Addendum)
 Radiation Oncology         [phone] ________________________________  Name: Donald Larson MRN: [id number]  Date: 12/02/2023  DOB: Nov 02, 1945  Follow-Up Visit Note  CC: Donald Larson HERO, MD  Donald Larson HERO, MD  Diagnosis and Prior Radiotherapy:    C34.2   ICD-10-CM   1. 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  He received 46 Gray in 23 fractions.  He then expressed reluctance to complete treatment.  He did not show for appointments as scheduled. Therefore upon further discussion with Dr. Izell, he agreed to continue treatment at a hypofractionated technique so that he could come in for less visits.  He tolerated an additional 6 fractions at 2.7 Gray per fraction to yield 16.2 Elnor more.   Total dose received was 62.2 Gray in 29 fractions.  He refused further treatment after this point in time.  His treatment elapsed over 2 months with breaks that were against medical advice.      ==========DELIVERED PLANS==========  First Treatment Date: 2022-12-22 - Last Treatment Date: 2022-12-30   Plan Name: Lung_L_SBRT Site: Lung, Left Technique: SBRT/SRT-IMRT Mode: Photon Dose Per Fraction: 12 Gy Prescribed Dose (Delivered / Prescribed): 60 Gy / 60 Gy Prescribed Fxs (Delivered / Prescribed): 5 / 5   Plan Name: Lung_R_SBRT Site: Lung, Right Technique: SBRT/SRT-IMRT Mode: Photon Dose Per Fraction: 18  Gy Prescribed Dose (Delivered / Prescribed): 54 Gy / 54 Gy Prescribed Fxs (Delivered / Prescribed): 3 / 3    CHIEF COMPLAINT:  Here for follow-up and surveillance of lung/ throat cancer  Narrative:    Mr. Donald Larson presents in the clinic for a follow up and to review PET scan results.  He is unaccompanied.  I tried to call his grandson Donald Larson but could only leave a voicemail.  The patient denies any new respiratory symptoms.  He is still smoking and is not motivated to quit.SABRA  He reports a chronically low appetite.    Wt Readings from Last 3 Encounters:  12/02/23 120 lb (54.4 kg)  07/08/23 122 lb 8 oz (55.6 kg)  04/03/23 121 lb 12.8 oz (55.2 kg)       ALLERGIES:  has no known allergies.  Meds: Current Outpatient Medications  Medication Sig Dispense Refill   Aspirin-Caffeine (BC FAST PAIN RELIEF PO) Take 2 packets by mouth daily as needed (pain).     levothyroxine  (SYNTHROID ) 25 MCG tablet Take 1 tablet PO on empty stomach every morning, 1 hour before other medications, vitamins, coffee, or breakfast. 90 tablet 3   omeprazole  (PRILOSEC) 20 MG capsule Take 20 mg by mouth daily.     Oxycodone  HCl 10 MG TABS Take 10 mg by mouth every 6 (six) hours as needed (pain).     No current facility-administered medications for this encounter.    Physical Findings: The patient  is in no acute distress. Patient is alert and oriented. Wt Readings from Last 3 Encounters:  12/02/23 120 lb (54.4 kg)  07/08/23 122 lb 8 oz (55.6 kg)  04/03/23 121 lb 12.8 oz (55.2 kg)    height is 5' 8 (1.727 m) and weight is 120 lb (54.4 kg). His temperature is 97.6 F (36.4 C). His blood pressure is 178/79 (abnormal) and his pulse is 58 (abnormal). His respiration is 20 and oxygen saturation is 97%. .  General: Alert and oriented, in no acute distress HEENT: Head is normocephalic.  His tongue is coated with tobacco stains.  Otherwise no lesions in the mouth or upper throat Neck: Neck is notable for no  masses Heart: Regular in rate and rhythm   Chest:  scattered wheezes and coarse sounds   Lymphatics: see Neck Exam Psychiatric:  Affect is appropriate.  MSK: muscle wasting, ambulatory. There is a subcutaneous 3 to 4 cm soft tissue mass over the right deltoid that is mobile and soft, consistent with lipoma.  Patient states he has had it  forever  Lab Findings: Lab Results  Component Value Date   WBC 4.9 02/02/2023   HGB 12.6 (L) 02/02/2023   HCT 36.8 (L) 02/02/2023   MCV 95.3 02/02/2023   PLT 124 (L) 02/02/2023    Lab Results  Component Value Date   TSH 20.089 (H) 07/07/2023    Radiographic Findings: CT Chest Wo Contrast Result Date: 11/30/2023 CLINICAL DATA:  Non-small cell lung cancer. EXAM: CT CHEST WITHOUT CONTRAST TECHNIQUE: Multidetector CT imaging of the chest was performed following the standard protocol without IV contrast. RADIATION DOSE REDUCTION: This exam was performed according to the departmental dose-optimization program which includes automated exposure control, adjustment of the mA and/or kV according to patient size and/or use of iterative reconstruction technique. COMPARISON:  Head CT scan 06/23/2018 FINDINGS: Cardiovascular: Coronary artery calcification and aortic atherosclerotic calcification. Mediastinum/Nodes: Similar lymph node along the RIGHT bronchus intermedius measures 9 mm unchanged (image 99/2 Lungs/Pleura: Again demonstrated ground-glass density adjacent fiducial markers in the RIGHT upper lobe (image 41). Nodularity. Peripheral nodule in the RIGHT upper lobe decreased in size measuring 5 mm (image 100/2) compared to 9 mm. New small peribronchial nodule in the RIGHT upper lobe measures 5 mm (image 67/2. New small nodule in the RIGHT lower lobe measures 4 mm (90/2 Within LEFT lung, pleural base nodule in the posterior superior LEFT lower lobe measures 14 mm (image 47) increased from 6 mm. Fiducial markers in the LEFT upper lobe adjacent to pleuroparenchymal  thickening is unchanged on image 63. Upper Abdomen: Limited view of the liver, kidneys, pancreas are unremarkable. Normal adrenal glands. Musculoskeletal: No aggressive osseous lesion. IMPRESSION: 1. Two new small pulmonary nodules in the RIGHT lung. 2. Interval decrease in size of RIGHT upper upper lobe nodule. 3. Interval increase in size of LEFT lower lobe pulmonary nodule. 4. Stable findings of fiducial marker treatment site in the RIGHT upper lobe and LEFT lower lobe. 5. Stable RIGHT perihilar lymph node. Electronically Signed   By: Jackquline Boxer M.D.   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 Additional history of non-small cell lung cancer. EXAM: NUCLEAR MEDICINE PET SKULL BASE TO THIGH TECHNIQUE: 6.1 mCi F-18 FDG was injected intravenously. Full-ring PET imaging was performed from the skull base to thigh after the radiotracer. CT data was obtained and used for attenuation correction  and anatomic localization. Fasting blood glucose: 93 mg/dl COMPARISON:  None Available. FINDINGS: NECK: No hypermetabolic activity in the oropharynx or hypopharynx. No hypermetabolic cervical lymph nodes. Incidental CT findings: None. CHEST: Starting in RIGHT lung apex cm persistent ground-glass density with mild metabolic activity adjacent to fiducial markers unchanged from prior. Previously hypermetabolic RIGHT upper lobe pulmonary nodule is decreased in size measuring 5 mm decreased from 9 mm. Metabolic activity is also decreased with SUV max equal 1.4 paired SUV max equal 3.8 (image 93). New RIGHT upper lobe pulmonary nodule RIGHT lower lobe pulmonary nodule measuring 7 mm on image 77 has associated metabolic activity. Very small pulmonary nodule in the RIGHT lower lobe on image 88 measuring 4 mm (image 88) has mild metabolic activity. This nodule is new from prior. Persistent intense hypermetabolic  RIGHT infrahilar lymph node with SUV max equal 13.8 on image 93. Enlarged nodule along the pleural surface of the posterior LEFT upper lobe 14 mm nodule on image 67 with SUV max equal 3.6. Nodule increased from a small 6 mm on prior with mild activity. Incidental CT findings: None. ABDOMEN/PELVIS: No abnormal hypermetabolic activity within the liver, pancreas, adrenal glands, or spleen. No hypermetabolic lymph nodes in the abdomen or pelvis. Incidental CT findings: Atherosclerotic calcification of the aorta. SKELETON: No focal hypermetabolic activity to suggest skeletal metastasis. Incidental CT findings: None. IMPRESSION: 1. No evidence of local head neck carcinoma recurrence. 2. No evidence of metastatic adenopathy in the neck. 3. Persistent intense hypermetabolic RIGHT infrahilar lymph node consistent with nodal metastasis. 4. New small hypermetabolic pulmonary nodules in the RIGHT upper lobe and RIGHT lower lobe consistent with pulmonary metastasis. 5. Enlarging hypermetabolic pleural-based nodule in the LEFT upper lobe consistent with pulmonary metastasis. 6. Stable posttreatment changes in the RIGHT lung apex. 7. Decreased in size and radiotracer activity a peripheral nodule in the RIGHT upper lobe. 8.  Aortic Atherosclerosis (ICD10-I70.0). Electronically Signed   By: Jackquline Boxer M.D.   On: 11/30/2023 10:24    Impression/Plan: Today I reviewed the patient's PET results with him.  He has some small suspicious nodules in his lungs as well as a hypermetabolic right hilar lymph node that is suspicious.  I recommend that he see pulmonology for bronchoscopy and biopsies.  I am confident that the lymph node would be accessible by bronchoscopy at the very least, and I will defer to him on whether any other suspicious sites are also accessible for biopsy.  Once we have the results from pulmonology we can determine next steps.  The patient is agreeable with this plan and our head and neck navigator will make the  referral to pulmonology and add him to our tumor board for discussion (since he is an established ENT patient) after pathology results are available.  I left a voicemail with the patient's grandson to update him on the plan  Previous lung SBRT in 12/2022:            Thyroid  function: He is on levothyroxine  due to abnormal labs in January.  Updated TSH results are pending  today  Lab Results  Component Value Date   TSH 20.089 (H) 07/07/2023        On date of service, in total, I spent 30 minutes on this encounter. Patient was seen in person.    _____________________________________   Lauraine Golden, MD

## 2023-12-03 ENCOUNTER — Other Ambulatory Visit: Payer: Self-pay

## 2023-12-14 NOTE — Progress Notes (Signed)
 Oncology Nurse Navigator Documentation   Dr. Izell requested a referral to pulmonology on 6/25 to discuss a possible biopsy of his hilar or another accessible node seen on PET scan. Donald Larson was already an established patient of Dr. Shelah so on 6/26 I called to request an appointment for Donald Larson. There was nothing available until September so they sent a message to their scheduling staff to see if he could be scheduled earlier.   Today (7/7) I called back to Dr. Lanny office to check on the status of an appointment because my call had not been returned. Their office is closed today and another note was sent to call me for an appointment. I did get an appointment scheduled for 9/10 at this time which will be moved when/if an earlier appointment can be scheduled. I have notified Donald Larson and his grandson [name]. They have my direct contact information to call me back if they have any questions.   Delon Jefferson RN, BSN, OCN Head & Neck Oncology Nurse Navigator  [facility] Phone # [phone]  Fax # [phone]

## 2023-12-23 ENCOUNTER — Telehealth: Payer: Self-pay | Admitting: Radiology

## 2023-12-23 DIAGNOSIS — E038 Other specified hypothyroidism: Secondary | ICD-10-CM

## 2023-12-23 MED ORDER — LEVOTHYROXINE SODIUM 25 MCG PO TABS: ORAL_TABLET | ORAL | 3 refills | Status: DC

## 2023-12-23 NOTE — Telephone Encounter (Addendum)
 I called the patient to review the results of his most recent TSH. His grandson reports that he has not been taking Synthroid  regularly, and that they do not have this medication. I will reorder his previous Synthroid  prescription. Shared with the patient that this should be taken an hour before food in the AM. I will recheck his thyroid  labs in 6 weeks. I also encouraged the patient to keep his scheduled follow-up with Dr. Lanny office on 01/06/2024 for further evaluation of pulmonary nodules. He expressed understanding and was in agreement with the stated plan.      Leeroy Due, PA-C

## 2023-12-28 ENCOUNTER — Other Ambulatory Visit: Payer: Self-pay | Admitting: *Deleted

## 2023-12-28 NOTE — Progress Notes (Signed)
 The proposed treatment discussed in conference is for discussion purpose only and is not a binding recommendation.  The patients have not been physically examined, or presented with their treatment options.  Therefore, final treatment plans cannot be decided.

## 2024-01-06 ENCOUNTER — Telehealth: Payer: Self-pay | Admitting: Acute Care

## 2024-01-06 ENCOUNTER — Encounter: Payer: Self-pay | Admitting: Acute Care

## 2024-01-06 ENCOUNTER — Encounter: Payer: Self-pay | Admitting: Emergency Medicine

## 2024-01-06 ENCOUNTER — Ambulatory Visit: Admitting: Acute Care

## 2024-01-06 VITALS — BP 183/79 | HR 65 | Ht 68.0 in | Wt 120.2 lb

## 2024-01-06 DIAGNOSIS — R942 Abnormal results of pulmonary function studies: Secondary | ICD-10-CM

## 2024-01-06 DIAGNOSIS — J449 Chronic obstructive pulmonary disease, unspecified: Secondary | ICD-10-CM

## 2024-01-06 DIAGNOSIS — Z720 Tobacco use: Secondary | ICD-10-CM | POA: Diagnosis not present

## 2024-01-06 DIAGNOSIS — Z85819 Personal history of malignant neoplasm of unspecified site of lip, oral cavity, and pharynx: Secondary | ICD-10-CM

## 2024-01-06 DIAGNOSIS — Z85118 Personal history of other malignant neoplasm of bronchus and lung: Secondary | ICD-10-CM

## 2024-01-06 DIAGNOSIS — R918 Other nonspecific abnormal finding of lung field: Secondary | ICD-10-CM | POA: Diagnosis not present
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
## 2024-01-06 NOTE — Telephone Encounter (Signed)
 Please schedule the following:  Provider performing procedure: Byrum Diagnosis:  Hypermetabolic lung nodules and lymph node Which side if for nodule / mass?  Right  Procedure:  Navigational bronchoscopy with biopsies and EBUS   Has patient been spoken to by Provider and given informed consent?  Yes, to Helmuth Recupero, NP Anesthesia: General  Do you need Fluro?  Yes Duration of procedure:  1.5 Date: 01/18/2024 Alternate Date: That date is available  Time: 9 am slot if possible Location:  [facility] Does patient have OSA?  No  DM? No Or Latex allergy? No Medication Restriction/ Anticoagulate/Antiplatelet:  Hold aspirin x 2 days Pre-op Labs Ordered:determined by Anesthesia Imaging request:  Last CT Chest 11/27/2023 (If, SuperDimension CT Chest, please have STAT courier sent to ENDO)

## 2024-01-06 NOTE — Telephone Encounter (Signed)
 Patient has been scheduled and letter has been given to Kaiya. Routing to [employer] to [employer].

## 2024-01-06 NOTE — Patient Instructions (Signed)
 It is good to see you today. We have reviewed your PET scan and CT Chest. Next step is for bronchoscopy with biopsies  I have placed an order for a bronchoscopy with biopsies.  We have discussed the procedure in detail.  We have reviewed the risks and benefits of the procedure. These include bleeding, infection, puncture of the lung, and adverse reaction to anesthesia. You have agreed to proceed with biopsy to evaluate the nodules of concern. Your procedure will be done by Dr. Lamar Chris. You will receive a letter today with date time and information pertaining to the procedure. You will need someone to drive you to the procedure, stay with you during the procedure, and stay with you after the procedure. You will also need someone to stay with you for 24 hours after anesthesia to ensure you have cleared and are doing well. You will follow-up with me 1 week after the procedure to review the results and to ensure you are doing well. Call if you need us  prior to the procedure or if you have any questions at all. Please contact office for sooner follow up if symptoms do not improve or worsen or seek emergency care   Please work on quitting smoking. You can receive free nicotine replacement therapy (patches, gum, or mints) by calling 1-800-QUIT NOW. Please call so we can get you on the path to becoming a non-smoker. I know it is hard, but you can do this!  Hypnosis for smoking cessation  [employer] [phone]  Acupuncture for smoking cessation  [employer] [phone]
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
## 2024-01-11 NOTE — Progress Notes (Signed)
 I agree with the plans as outlined above.   Lamar Chris, MD, PhD 01/11/2024, 12:24 PM [facility] [phone] or if no answer before 7:00PM call [phone] For any issues after 7:00PM please call eLink [phone]

## 2024-01-15 ENCOUNTER — Encounter (HOSPITAL_COMMUNITY): Payer: Self-pay

## 2024-01-15 ENCOUNTER — Other Ambulatory Visit: Payer: Self-pay

## 2024-01-15 ENCOUNTER — Encounter (HOSPITAL_COMMUNITY): Payer: Self-pay | Admitting: Emergency Medicine

## 2024-01-15 NOTE — Progress Notes (Signed)
 PCP - Donald CHRISTELLA Pouch, MD  Chest x-ray - DOS EKG - DOS  Aspirin Instructions: Stop taking BC powder 2 days prior to procedure. It contains ASA  Anesthesia review: N  Patient verbally denies any shortness of breath, fever, cough and chest pain during phone call   -------------  SDW INSTRUCTIONS given:  Your procedure is scheduled on Monday, Aug 11th.  Report to [facility] Main Entrance A at 1045 A.M., and check in at the Admitting office.  Call this number if you have problems the morning of surgery:  [phone]   Remember:  Do not eat or drink after midnight the night before your surgery    Take these medicines the morning of surgery with A SIP OF WATER  levothyroxine  (SYNTHROID )  omeprazole  (PRILOSEC)  Oxycodone -if needed  As of today, STOP taking any Aspirin (unless otherwise instructed by your surgeon) Aleve, Naproxen, Ibuprofen , Motrin , Advil , Goody's, BC's, all herbal medications, fish oil, and all vitamins.                      Do not wear jewelry, make up, or nail polish            Do not wear lotions, powders, perfumes/colognes, or deodorant.            Do not shave 48 hours prior to surgery.  Men may shave face and neck.            Do not bring valuables to the hospital.            [facility] is not responsible for any belongings or valuables.  Do NOT Smoke (Tobacco/Vaping) 24 hours prior to your procedure If you use a CPAP at night, you may bring all equipment for your overnight stay.   Contacts, glasses, dentures or bridgework may not be worn into surgery.      For patients admitted to the hospital, discharge time will be determined by your treatment team.   Patients discharged the day of surgery will not be allowed to drive home, and someone needs to stay with them for 24 hours.    Special instructions:   [place]- Preparing For Surgery  Before surgery, you can play an important role. Because skin is not sterile, your skin needs to be as free of  germs as possible. You can reduce the number of germs on your skin by washing with CHG (chlorahexidine gluconate) Soap before surgery.  CHG is an antiseptic cleaner which kills germs and bonds with the skin to continue killing germs even after washing.    Oral Hygiene is also important to reduce your risk of infection.  Remember - BRUSH YOUR TEETH THE MORNING OF SURGERY WITH YOUR REGULAR TOOTHPASTE  Please do not use if you have an allergy to CHG or antibacterial soaps. If your skin becomes reddened/irritated stop using the CHG.  Do not shave (including legs and underarms) for at least 48 hours prior to first CHG shower. It is OK to shave your face.  Please follow these instructions carefully.   Shower the NIGHT BEFORE SURGERY and the MORNING OF SURGERY with DIAL Soap.   Pat yourself dry with a CLEAN TOWEL.  Wear CLEAN PAJAMAS to bed the night before surgery  Place CLEAN SHEETS on your bed the night of your first shower and DO NOT SLEEP WITH PETS.   Day of Surgery: Please shower morning of surgery  Wear Clean/Comfortable clothing the morning of surgery Do not apply  any deodorants/lotions.   Remember to brush your teeth WITH YOUR REGULAR TOOTHPASTE.   Questions were answered. Patient verbalized understanding of instructions.

## 2024-01-18 ENCOUNTER — Ambulatory Visit (HOSPITAL_COMMUNITY): Admission: RE | Admit: 2024-01-18 | Source: Home / Self Care | Admitting: Emergency Medicine

## 2024-01-18 DIAGNOSIS — R918 Other nonspecific abnormal finding of lung field: Secondary | ICD-10-CM | POA: Insufficient documentation

## 2024-01-18 HISTORY — DX: Hypothyroidism, unspecified: E03.9

## 2024-01-18 SURGERY — VIDEO BRONCHOSCOPY WITH ENDOBRONCHIAL NAVIGATION
Anesthesia: General | Laterality: Right

## 2024-01-19 ENCOUNTER — Telehealth: Payer: Self-pay

## 2024-01-19 NOTE — Telephone Encounter (Signed)
-----   Message from Lauraine JULIANNA Lites sent at 01/18/2024  3:49 PM EDT ----- Regarding: RE: pt canceled We will need to get this patient rescheduled after he is better. Can you check on him and see when he feels he can do this? Thanks ----- Message ----- From: Maurice Clotilda ORN, RN Sent: 01/18/2024   8:34 AM EDT To: Lauraine JULIANNA Lites, NP; [name], MD; Chris# Subject: pt canceled                                    Hey, Mr Sherk called this morning to cancel his case due to illness. I'm not sure if he called the office too, but wanted to make sure you all were aware.  Clotilda Maurice, RN

## 2024-01-19 NOTE — Telephone Encounter (Signed)
 Called patient he would like to keep his appointment to reschedule bronch on 01/25/2024

## 2024-01-25 ENCOUNTER — Encounter: Payer: Self-pay | Admitting: Emergency Medicine

## 2024-01-25 ENCOUNTER — Ambulatory Visit: Admitting: Acute Care

## 2024-01-25 DIAGNOSIS — R942 Abnormal results of pulmonary function studies: Secondary | ICD-10-CM

## 2024-01-25 DIAGNOSIS — J449 Chronic obstructive pulmonary disease, unspecified: Secondary | ICD-10-CM

## 2024-01-25 DIAGNOSIS — R918 Other nonspecific abnormal finding of lung field: Secondary | ICD-10-CM

## 2024-01-25 NOTE — Telephone Encounter (Signed)
 Copied from CRM [id number]. Topic: Appointments - Scheduling Inquiry for Clinic >> Jan 25, 2024  8:11 AM Isabell A wrote: Reason for CRM: Donald Larson calling to reschedule bronchoscopy and follow up visit.    Callback number: [phone]    When would you like us  to resc his bronch

## 2024-01-25 NOTE — Telephone Encounter (Signed)
 We could reschedule for 02/02/2024 if that is possible for him.  If not then would move to future Monday/Tuesday

## 2024-01-25 NOTE — Telephone Encounter (Signed)
 Dr Shelah- please let us  know when you could do his bronch. Will then work on rescheduling this, thanks!

## 2024-01-25 NOTE — Telephone Encounter (Signed)
 Called Mr Valera and went over the appt info with him

## 2024-02-01 ENCOUNTER — Encounter (HOSPITAL_COMMUNITY): Payer: Self-pay | Admitting: Emergency Medicine

## 2024-02-01 ENCOUNTER — Other Ambulatory Visit: Payer: Self-pay

## 2024-02-01 NOTE — Progress Notes (Signed)
 SDW call  Patient's grandson, [name] was given pre-op instructions over the phone. He  verbalized understanding of instructions provided.He denied pt having an SOB, CP or fever     PCP - Dr. Vaughan Pouch Cardiologist -  Pulmonary:    PPM/ICD - denies Device Orders - na Rep Notified - na   Chest x-ray - 02/02/2023 EKG -  DOS  02/02/2023 Stress Test - ECHO -  Cardiac Cath -   Sleep Study/sleep apnea/CPAP: denies  Non-diabetic  Blood thinner Instructions: denies  Aspirin Instructions: hold 2 days   ERAS Protcol - NPO  Anesthesia review: No   Your procedure is scheduled on Tuesday February 02, 2024  Report to [facility] Main Entrance A at  0830  A.M., then check in with the Admitting office.  Call this number if you have problems the morning of surgery:  [phone]   If you have any questions prior to your surgery date call [phone]: Open Monday-Friday 8am-4pm If you experience any cold or flu symptoms such as cough, fever, chills, shortness of breath, etc. between now and your scheduled surgery, please notify us  at the above number     Remember:  Do not eat or drink after midnight the night before your surgery  Take these medicines the morning of surgery with A SIP OF WATER:  Synthroid , prilosec  As needed: oxycodone   As of today, STOP taking any Aspirin (unless otherwise instructed by your surgeon) Aleve, Naproxen, Ibuprofen , Motrin , Advil , Goody's, BC's, all herbal medications, fish oil, and all vitamins.

## 2024-02-02 ENCOUNTER — Ambulatory Visit (HOSPITAL_COMMUNITY): Admitting: Anesthesiology

## 2024-02-02 ENCOUNTER — Encounter (HOSPITAL_COMMUNITY): Payer: Self-pay | Admitting: Emergency Medicine

## 2024-02-02 ENCOUNTER — Ambulatory Visit (HOSPITAL_COMMUNITY)
Admission: RE | Admit: 2024-02-02 | Discharge: 2024-02-02 | Disposition: A | Discharge status: Home / Self Care | Attending: Emergency Medicine | Admitting: Emergency Medicine

## 2024-02-02 ENCOUNTER — Encounter (HOSPITAL_COMMUNITY)
Admission: RE | Disposition: A | Discharge status: Home / Self Care | Payer: Self-pay | Source: Home / Self Care | Attending: Emergency Medicine

## 2024-02-02 ENCOUNTER — Ambulatory Visit (HOSPITAL_COMMUNITY)

## 2024-02-02 ENCOUNTER — Other Ambulatory Visit: Payer: Self-pay

## 2024-02-02 DIAGNOSIS — C3412 Malignant neoplasm of upper lobe, left bronchus or lung: Secondary | ICD-10-CM | POA: Diagnosis not present

## 2024-02-02 DIAGNOSIS — R918 Other nonspecific abnormal finding of lung field: Secondary | ICD-10-CM

## 2024-02-02 DIAGNOSIS — K219 Gastro-esophageal reflux disease without esophagitis: Secondary | ICD-10-CM | POA: Insufficient documentation

## 2024-02-02 DIAGNOSIS — F17210 Nicotine dependence, cigarettes, uncomplicated: Secondary | ICD-10-CM | POA: Insufficient documentation

## 2024-02-02 DIAGNOSIS — Z8521 Personal history of malignant neoplasm of larynx: Secondary | ICD-10-CM | POA: Insufficient documentation

## 2024-02-02 DIAGNOSIS — C3432 Malignant neoplasm of lower lobe, left bronchus or lung: Secondary | ICD-10-CM | POA: Diagnosis not present

## 2024-02-02 DIAGNOSIS — R590 Localized enlarged lymph nodes: Secondary | ICD-10-CM

## 2024-02-02 DIAGNOSIS — J449 Chronic obstructive pulmonary disease, unspecified: Secondary | ICD-10-CM | POA: Diagnosis not present

## 2024-02-02 DIAGNOSIS — I10 Essential (primary) hypertension: Secondary | ICD-10-CM

## 2024-02-02 DIAGNOSIS — Z9221 Personal history of antineoplastic chemotherapy: Secondary | ICD-10-CM | POA: Diagnosis not present

## 2024-02-02 DIAGNOSIS — Z923 Personal history of irradiation: Secondary | ICD-10-CM | POA: Diagnosis not present

## 2024-02-02 DIAGNOSIS — R911 Solitary pulmonary nodule: Secondary | ICD-10-CM | POA: Diagnosis not present

## 2024-02-02 HISTORY — PX: VIDEO BRONCHOSCOPY WITH ENDOBRONCHIAL ULTRASOUND: SHX6177

## 2024-02-02 HISTORY — PX: VIDEO BRONCHOSCOPY WITH ENDOBRONCHIAL NAVIGATION: SHX6175

## 2024-02-02 LAB — CBC
HCT: 41.8 % (ref 39.0–52.0)
Hemoglobin: 14.3 g/dL (ref 13.0–17.0)
MCH: 33.6 pg (ref 26.0–34.0)
MCHC: 34.2 g/dL (ref 30.0–36.0)
MCV: 98.1 fL (ref 80.0–100.0)
Platelets: 136 K/uL — ABNORMAL LOW (ref 150–400)
RBC: 4.26 MIL/uL (ref 4.22–5.81)
RDW: 13.2 % (ref 11.5–15.5)
WBC: 5.2 K/uL (ref 4.0–10.5)
nRBC: 0 % (ref 0.0–0.2)

## 2024-02-02 LAB — BASIC METABOLIC PANEL WITH GFR
Anion gap: 11 (ref 5–15)
BUN: 13 mg/dL (ref 8–23)
CO2: 22 mmol/L (ref 22–32)
Calcium: 8.6 mg/dL — ABNORMAL LOW (ref 8.9–10.3)
Chloride: 105 mmol/L (ref 98–111)
Creatinine, Ser: 1 mg/dL (ref 0.61–1.24)
GFR, Estimated: >60 mL/min (ref >60)
Glucose, Bld: 88 mg/dL (ref 70–99)
Potassium: 3.7 mmol/L (ref 3.5–5.1)
Sodium: 138 mmol/L (ref 135–145)

## 2024-02-02 SURGERY — VIDEO BRONCHOSCOPY WITH ENDOBRONCHIAL NAVIGATION
Anesthesia: General | Laterality: Right

## 2024-02-02 MED ORDER — PHENYLEPHRINE HCL-NACL 20-0.9 MG/250ML-% IV SOLN
INTRAVENOUS | Status: DC | PRN
  Administered 2024-02-02: 35 ug/min via INTRAVENOUS

## 2024-02-02 MED ORDER — AMISULPRIDE (ANTIEMETIC) 5 MG/2ML IV SOLN: 10.0000 mg | Freq: Once | INTRAVENOUS | Status: DC | PRN

## 2024-02-02 MED ORDER — EPINEPHRINE 1 MG/10ML IJ SOSY
PREFILLED_SYRINGE | INTRAMUSCULAR | Status: AC
  Filled 2024-02-02: qty 10

## 2024-02-02 MED ORDER — FENTANYL CITRATE (PF) 100 MCG/2ML IJ SOLN: 25.0000 ug | INTRAMUSCULAR | Status: DC | PRN

## 2024-02-02 MED ORDER — ONDANSETRON HCL 4 MG/2ML IJ SOLN
INTRAMUSCULAR | Status: DC | PRN
  Administered 2024-02-02: 4 mg via INTRAVENOUS

## 2024-02-02 MED ORDER — ONDANSETRON HCL 4 MG/2ML IJ SOLN: 4.0000 mg | Freq: Once | INTRAMUSCULAR | Status: DC | PRN

## 2024-02-02 MED ORDER — ACETAMINOPHEN 10 MG/ML IV SOLN: 1000.0000 mg | Freq: Once | INTRAVENOUS | Status: DC | PRN

## 2024-02-02 MED ORDER — CHLORHEXIDINE GLUCONATE 0.12 % MT SOLN
15.0000 mL | Freq: Once | OROMUCOSAL | Status: AC
  Administered 2024-02-02: 15 mL via OROMUCOSAL

## 2024-02-02 MED ORDER — DEXAMETHASONE SODIUM PHOSPHATE 10 MG/ML IJ SOLN
INTRAMUSCULAR | Status: DC | PRN
  Administered 2024-02-02: 10 mg via INTRAVENOUS

## 2024-02-02 MED ORDER — SUGAMMADEX SODIUM 200 MG/2ML IV SOLN
INTRAVENOUS | Status: DC | PRN
  Administered 2024-02-02: 200 mg via INTRAVENOUS

## 2024-02-02 MED ORDER — FENTANYL CITRATE (PF) 250 MCG/5ML IJ SOLN
INTRAMUSCULAR | Status: DC | PRN
  Administered 2024-02-02 (×2): 50 ug via INTRAVENOUS

## 2024-02-02 MED ORDER — ROCURONIUM BROMIDE 10 MG/ML (PF) SYRINGE
PREFILLED_SYRINGE | INTRAVENOUS | Status: DC | PRN
  Administered 2024-02-02: 40 mg via INTRAVENOUS
  Administered 2024-02-02: 10 mg via INTRAVENOUS
  Administered 2024-02-02: 20 mg via INTRAVENOUS
  Administered 2024-02-02: 10 mg via INTRAVENOUS

## 2024-02-02 MED ORDER — SODIUM CHLORIDE (PF) 0.9 % IJ SOLN
PREFILLED_SYRINGE | INTRAMUSCULAR | Status: DC | PRN
  Administered 2024-02-02: 3 mL

## 2024-02-02 MED ORDER — LACTATED RINGERS IV SOLN: INTRAVENOUS | Status: DC

## 2024-02-02 MED ORDER — FENTANYL CITRATE (PF) 100 MCG/2ML IJ SOLN
INTRAMUSCULAR | Status: AC
Start: 2024-02-02 — End: 2024-02-02
  Filled 2024-02-02: qty 2

## 2024-02-02 MED ORDER — EPHEDRINE SULFATE (PRESSORS) 50 MG/ML IJ SOLN
INTRAMUSCULAR | Status: DC | PRN
  Administered 2024-02-02: 10 mg via INTRAVENOUS

## 2024-02-02 MED ORDER — LIDOCAINE 2% (20 MG/ML) 5 ML SYRINGE
INTRAMUSCULAR | Status: DC | PRN
  Administered 2024-02-02: 60 mg via INTRAVENOUS

## 2024-02-02 MED ORDER — PROPOFOL 10 MG/ML IV BOLUS
INTRAVENOUS | Status: DC | PRN
  Administered 2024-02-02: 200 mg via INTRAVENOUS

## 2024-02-02 SURGICAL SUPPLY — 37 items

## 2024-02-02 NOTE — Transfer of Care (Signed)
 Immediate Anesthesia Transfer of Care Note  Patient: Donald Larson  Procedure(s) Performed: VIDEO BRONCHOSCOPY WITH ENDOBRONCHIAL NAVIGATION (Right) BRONCHOSCOPY, WITH EBUS  Patient Location: PACU  Anesthesia Type:General  Level of Consciousness: sedated, drowsy, patient cooperative, and responds to stimulation  Airway & Oxygen Therapy: Patient Spontanous Breathing and Patient connected to face mask oxygen  Post-op Assessment: Report given to RN, Post -op Vital signs reviewed and stable, and Patient moving all extremities X 4  Post vital signs: Reviewed and stable  Last Vitals:  Vitals Value Taken Time  BP 161/87 02/02/24 13:14  Temp 37.1 C 02/02/24 13:14  Pulse 86 02/02/24 13:14  Resp 18 02/02/24 13:14  SpO2 96 % 02/02/24 13:14  Vitals shown include unfiled device data.  Last Pain:  Vitals:   02/02/24 0850  TempSrc:   PainSc: 0-No pain      Patients Stated Pain Goal: 0 (02/02/24 0850)  Complications: No notable events documented.

## 2024-02-02 NOTE — Discharge Instructions (Signed)

## 2024-02-02 NOTE — Anesthesia Preprocedure Evaluation (Addendum)
 Anesthesia Evaluation  Patient identified by MRN, date of birth, ID band Patient awake    Reviewed: Allergy & Precautions, NPO status , Patient's Chart, lab work & pertinent test results  Airway Mallampati: II  TM Distance: >3 FB Neck ROM: Full    Dental  (+) Edentulous Lower, Edentulous Upper   Pulmonary COPD, Current Smoker and Patient abstained from smoking.   Pulmonary exam normal        Cardiovascular hypertension, Normal cardiovascular exam     Neuro/Psych    GI/Hepatic ,GERD  Medicated and Controlled,,(+)     substance abuse    Endo/Other  Hypothyroidism    Renal/GU negative Renal ROS     Musculoskeletal   Abdominal   Peds  Hematology   Anesthesia Other Findings Multiple lung nodules  Reproductive/Obstetrics                              Anesthesia Physical Anesthesia Plan  ASA: 3  Anesthesia Plan: General   Post-op Pain Management:    Induction: Intravenous  PONV Risk Score and Plan: 1 and Ondansetron , Dexamethasone  and Treatment may vary due to age or medical condition  Airway Management Planned: Oral ETT  Additional Equipment:   Intra-op Plan:   Post-operative Plan: Extubation in OR  Informed Consent: I have reviewed the patients History and Physical, chart, labs and discussed the procedure including the risks, benefits and alternatives for the proposed anesthesia with the patient or authorized representative who has indicated his/her understanding and acceptance.       Plan Discussed with: CRNA  Anesthesia Plan Comments:          Anesthesia Quick Evaluation

## 2024-02-02 NOTE — Anesthesia Procedure Notes (Addendum)
 Procedure Name: Intubation Date/Time: 02/02/2024 11:11 AM  Performed by: Vergie Ike LABOR, CRNAPre-anesthesia Checklist: Patient identified, Emergency Drugs available, Suction available and Patient being monitored Patient Re-evaluated:Patient Re-evaluated prior to induction Oxygen Delivery Method: Circle System Utilized Preoxygenation: Pre-oxygenation with 100% oxygen Induction Type: IV induction Ventilation: Mask ventilation without difficulty Laryngoscope Size: Miller and 3 Grade View: Grade I Tube type: Oral Tube size: 8.5 mm Number of attempts: 1 Airway Equipment and Method: Stylet and Oral airway Placement Confirmation: ETT inserted through vocal cords under direct vision, positive ETCO2 and breath sounds checked- equal and bilateral Secured at: 25 cm Tube secured with: Tape Dental Injury: Teeth and Oropharynx as per pre-operative assessment

## 2024-02-02 NOTE — Op Note (Signed)
 Procedure Note  Patient: Donald Larson  [employer] Cios mobile C-arm was utilized to identify and biopsy left upper lobe and right lower lobe superior segment pulmonary nodules.  Needle-in-lesion was confirmed using real-time Cios imaging, and images were uploaded to PACS.         Lamar Chris, MD, PhD 02/02/2024, 1:23 PM [facility] [phone] or if no answer before 7:00PM call [phone] For any issues after 7:00PM please call eLink [phone]

## 2024-02-02 NOTE — Interval H&P Note (Signed)
 History and Physical Interval Note:  02/02/2024 10:52 AM  Donald Larson  has presented today for surgery, with the diagnosis of lung nodules.  The various methods of treatment have been discussed with the patient and family. After consideration of risks, benefits and other options for treatment, the patient has consented to  Procedure(s): VIDEO BRONCHOSCOPY WITH ENDOBRONCHIAL NAVIGATION (Right) and ENDOBRONCHIAL ULTRASOUND as a surgical intervention.  The patient's history has been reviewed, patient examined, no change in status, stable for surgery.  I have reviewed the patient's chart and labs.  Questions were answered to the patient's satisfaction.     [name]

## 2024-02-02 NOTE — Op Note (Signed)
 Video Bronchoscopy with Endobronchial Ultrasound and Electromagnetic Navigation Procedure Note  Date of Operation: 02/02/2024  Pre-op Diagnosis: Pulmonary nodules, right subhilar lymphadenopathy  Post-op Diagnosis: Pulmonary nodules, right subhilar lymphadenopathy, left lower lobe superior segmental endobronchial lesion  Surgeon: LAMAR CHRIS  Assistants: None  Anesthesia: General endotracheal anesthesia  Operation: Flexible video fiberoptic bronchoscopy with endobronchial ultrasound, robotic assisted navigation and biopsies.  Estimated Blood Loss: Minimal  Complications: None apparent  Indications and History: Donald Larson is a 78 y.o. male with history of tobacco use, laryngeal cancer, bilateral lung cancers found to have scattered pulmonary nodules and a right subhilar enlarged lymph node on surveillance imaging.  Recommendation was made to achieve a tissue diagnosis using endobronchial ultrasound and robotic assisted navigational bronchoscopy. The risks, benefits, complications, treatment options and expected outcomes were discussed with the patient.  The possibilities of pneumothorax, pneumonia, reaction to medication, pulmonary aspiration, perforation of a viscus, bleeding, failure to diagnose a condition and creating a complication requiring transfusion or operation were discussed with the patient who freely signed the consent.    Description of Procedure: The patient was seen in the Preoperative Area, was examined and was deemed appropriate to proceed.  The patient was taken to [facility] Endoscopy room 3, identified as [name] and the procedure verified as Flexible Video Fiberoptic Bronchoscopy with robotic assisted navigation and endobronchial ultrasound.  A Time Out was held and the above information confirmed.   Robotic assisted navigation: Prior to the date of the procedure a high-resolution CT scan of the chest was performed. Utilizing ION software program a virtual  tracheobronchial tree was generated to allow the creation of distinct navigation pathways to the patient's parenchymal abnormalities. After being taken to the operating room general anesthesia was initiated and the patient  was orally intubated. The video fiberoptic bronchoscope was introduced via the endotracheal tube and a general inspection was performed which showed normal right sided airways.  There was a small exophytic endobronchial lesion in the left superior segmental airway with enlargement and irregularity of the left lower lobe superior segmental carina.  Endobronchial forceps biopsies were performed on the carina and the endobronchial lesion to go for pathology. Aspiration of the bilateral mainstems was completed to remove any remaining secretions. Robotic catheter inserted into patient's endotracheal tube.      Media Information    Photos  Left Lower Lobe Superior Segmental carina and endobronchial lesion  02/02/2024 13:15    Target #1 left upper lobe pleural-based nodule: The distinct navigation pathways prepared prior to this procedure were then utilized to navigate to patient's lesion identified on CT scan. The robotic catheter was secured into place and the vision probe was withdrawn.  Lesion location was approximated using fluoroscopy.  Local registration and targeting was performed using [employer] Cios mobile C-arm three-dimensional imaging. Under fluoroscopic guidance transbronchial brushings, transbronchial needle biopsies, and transbronchial cryoprobe biopsies were performed to be sent for cytology and pathology.  Needle-in-lesion was confirmed using Cios mobile C-arm.  A bronchioalveolar lavage was performed in the left upper lobe adjacent to the nodule and sent for microbiology.    Target #2 right lower lobe superior segmental nodule: The distinct navigation pathways prepared prior to this procedure were then utilized to navigate to patient's lesion identified on  CT scan. The robotic catheter was secured into place and the vision probe was withdrawn.  Lesion location was approximated using fluoroscopy.  Local registration and targeting was performed using [employer] Cios mobile C-arm three-dimensional imaging. Under fluoroscopic  guidance transbronchial needle biopsies and transbronchial cryoprobe biopsies were performed to be sent for cytology and pathology.  Needle-in-lesion was confirmed using Cios mobile C-arm.   Under fluoroscopic guidance a single fiducial marker was placed adjacent to the nodule.  Endobronchial ultrasound: The robotic scope was then withdrawn and the endobronchial ultrasound was used to identify and characterize the peritracheal, hilar and bronchial lymph nodes. Inspection showed significant enlargement of multiple inferior station 7 lymph nodes. Using real-time ultrasound guidance Wang needle biopsies were take from inferior Station 7 nodes and were sent for cytology.   At the end of the procedure a general airway inspection was performed and there was no evidence of active bleeding. The bronchoscope was removed.  The patient tolerated the procedure well. There was no significant blood loss and there were no obvious complications. A post-procedural chest x-ray is pending.   Endobronchial samples: 1. Endobronchial biopsies from left lower lobe superior segment carina and endobronchial lesion  Samples Target #1: 1. Transbronchial brushings from left upper lobe nodule 2. Transbronchial Wang needle biopsies from left upper lobe nodule 3. Transbronchial cryoprobe biopsies from left upper lobe nodule 4. Bronchoalveolar lavage from left upper lobe  Samples Target #2: 1. Transbronchial Wang needle biopsies from right lower lobe nodule 2. Transbronchial cryoprobe biopsies from right lower lobe nodule  EBUS Samples: 1. Wang needle biopsies from inferior station 7 nodes    Lamar Chris, MD, PhD 02/02/2024, 1:02 PM [place]  [facility]

## 2024-02-02 NOTE — Anesthesia Postprocedure Evaluation (Signed)
 Anesthesia Post Note  Patient: Donald Larson  Procedure(s) Performed: VIDEO BRONCHOSCOPY WITH ENDOBRONCHIAL NAVIGATION (Right) BRONCHOSCOPY, WITH EBUS     Patient location during evaluation: PACU Anesthesia Type: General Level of consciousness: awake Pain management: pain level controlled Vital Signs Assessment: post-procedure vital signs reviewed and stable Respiratory status: spontaneous breathing, nonlabored ventilation and respiratory function stable Cardiovascular status: blood pressure returned to baseline and stable Postop Assessment: no apparent nausea or vomiting Anesthetic complications: no   No notable events documented.  Last Vitals:  Vitals:   02/02/24 1345 02/02/24 1400  BP: (!) 144/77 (!) 151/72  Pulse: 77 79  Resp: 14 17  Temp:  36.6 C  SpO2: 91% 92%    Last Pain:  Vitals:   02/02/24 1400  TempSrc:   PainSc: 0-No pain                 Trishna Cwik P Abbee Cremeens

## 2024-02-03 ENCOUNTER — Encounter (HOSPITAL_COMMUNITY): Payer: Self-pay | Admitting: Emergency Medicine

## 2024-02-03 LAB — ACID FAST SMEAR (AFB, MYCOBACTERIA): Acid Fast Smear: NEGATIVE

## 2024-02-03 LAB — CYTOLOGY - NON PAP

## 2024-02-04 LAB — CULTURE, BAL-QUANTITATIVE W GRAM STAIN
Culture: 2000 — AB
Gram Stain: NONE SEEN

## 2024-02-04 LAB — CYTOLOGY - NON PAP

## 2024-02-07 LAB — AEROBIC/ANAEROBIC CULTURE W GRAM STAIN (SURGICAL/DEEP WOUND): Gram Stain: NONE SEEN

## 2024-02-10 ENCOUNTER — Ambulatory Visit: Admitting: Acute Care

## 2024-02-10 ENCOUNTER — Telehealth: Payer: Self-pay

## 2024-02-10 NOTE — Telephone Encounter (Signed)
 Copied from CRM #[id number]. Topic: General - Other >> Feb 10, 2024  9:14 AM Russell PARAS wrote: Reason for CRM:   Pt's grandson contacted clinic to report he will not make it to appt today, scheduled for 9 AM w/[facility]. He apologized and said will contact the clinic later to reschedule, after speaking with pt.   Routing to [employer], [place].

## 2024-02-11 ENCOUNTER — Other Ambulatory Visit: Payer: Self-pay

## 2024-02-14 ENCOUNTER — Encounter: Payer: Self-pay | Admitting: Oncology

## 2024-02-14 NOTE — Progress Notes (Signed)
 The proposed treatment discussed in conference is for discussion purpose only and is not a binding recommendation.  The patients have not been physically examined, or presented with their treatment options.  Therefore, final treatment plans cannot be decided.

## 2024-02-17 ENCOUNTER — Ambulatory Visit: Admitting: Emergency Medicine

## 2024-03-04 LAB — FUNGUS CULTURE WITH STAIN

## 2024-03-04 LAB — FUNGUS CULTURE RESULT

## 2024-03-04 LAB — FUNGAL ORGANISM REFLEX

## 2024-03-11 NOTE — Progress Notes (Signed)
 Oncology Nurse Navigator Documentation   At the request of Dr. Izell and Dr. Autumn, I have requested PDL1 testing to be completed on Mr. Bainter's recent lung biopsy from 02/02/24. An email was sent to pathology on 03/11/24 and a response was received that the test would be ordered today.   Delon Jefferson RN, BSN, OCN Head & Neck Oncology Nurse Navigator [facility] Phone # [phone]  Fax # [phone]

## 2024-03-14 ENCOUNTER — Encounter: Payer: Self-pay | Admitting: Oncology

## 2024-03-14 LAB — MOLECULAR PATHOLOGY

## 2024-03-17 LAB — ACID FAST CULTURE WITH REFLEXED SENSITIVITIES (MYCOBACTERIA): Acid Fast Culture: NEGATIVE

## 2024-03-22 ENCOUNTER — Encounter (HOSPITAL_COMMUNITY): Payer: Self-pay

## 2024-04-07 ENCOUNTER — Inpatient Hospital Stay: Attending: Oncology

## 2024-04-07 ENCOUNTER — Inpatient Hospital Stay (HOSPITAL_BASED_OUTPATIENT_CLINIC_OR_DEPARTMENT_OTHER): Admitting: Oncology

## 2024-04-07 VITALS — BP 157/86 | HR 64 | Temp 97.7°F | Resp 19 | Ht 69.0 in | Wt 119.5 lb

## 2024-04-07 DIAGNOSIS — C321 Malignant neoplasm of supraglottis: Secondary | ICD-10-CM | POA: Diagnosis present

## 2024-04-07 DIAGNOSIS — E039 Hypothyroidism, unspecified: Secondary | ICD-10-CM

## 2024-04-07 DIAGNOSIS — C7801 Secondary malignant neoplasm of right lung: Secondary | ICD-10-CM

## 2024-04-07 DIAGNOSIS — Z800 Family history of malignant neoplasm of digestive organs: Secondary | ICD-10-CM

## 2024-04-07 DIAGNOSIS — F1090 Alcohol use, unspecified, uncomplicated: Secondary | ICD-10-CM

## 2024-04-07 DIAGNOSIS — D630 Anemia in neoplastic disease: Secondary | ICD-10-CM | POA: Diagnosis not present

## 2024-04-07 DIAGNOSIS — I10 Essential (primary) hypertension: Secondary | ICD-10-CM | POA: Insufficient documentation

## 2024-04-07 DIAGNOSIS — Z85819 Personal history of malignant neoplasm of unspecified site of lip, oral cavity, and pharynx: Secondary | ICD-10-CM

## 2024-04-07 DIAGNOSIS — F17210 Nicotine dependence, cigarettes, uncomplicated: Secondary | ICD-10-CM

## 2024-04-07 DIAGNOSIS — F1290 Cannabis use, unspecified, uncomplicated: Secondary | ICD-10-CM

## 2024-04-07 DIAGNOSIS — Z801 Family history of malignant neoplasm of trachea, bronchus and lung: Secondary | ICD-10-CM

## 2024-04-07 LAB — CMP (CANCER CENTER ONLY)
ALT: 5 U/L (ref 0–44)
AST: 22 U/L (ref 15–41)
Albumin: 4.1 g/dL (ref 3.5–5.0)
Alkaline Phosphatase: 84 U/L (ref 38–126)
Anion gap: 7 (ref 5–15)
BUN: 13 mg/dL (ref 8–23)
CO2: 28 mmol/L (ref 22–32)
Calcium: 9.2 mg/dL (ref 8.9–10.3)
Chloride: 105 mmol/L (ref 98–111)
Creatinine: 0.88 mg/dL (ref 0.61–1.24)
GFR, Estimated: >60 mL/min (ref >60)
Glucose, Bld: 90 mg/dL (ref 70–99)
Potassium: 4.7 mmol/L (ref 3.5–5.1)
Sodium: 140 mmol/L (ref 135–145)
Total Bilirubin: 0.8 mg/dL (ref 0.0–1.2)
Total Protein: 6.8 g/dL (ref 6.5–8.1)

## 2024-04-07 LAB — CBC WITH DIFFERENTIAL (CANCER CENTER ONLY)
Abs Immature Granulocytes: 0.02 K/uL (ref 0.00–0.07)
Basophils Absolute: 0 K/uL (ref 0.0–0.1)
Basophils Relative: 0 %
Eosinophils Absolute: 0.1 K/uL (ref 0.0–0.5)
Eosinophils Relative: 1 %
HCT: 42.1 % (ref 39.0–52.0)
Hemoglobin: 14.9 g/dL (ref 13.0–17.0)
Immature Granulocytes: 0 %
Lymphocytes Relative: 39 %
Lymphs Abs: 3.1 K/uL (ref 0.7–4.0)
MCH: 33.9 pg (ref 26.0–34.0)
MCHC: 35.4 g/dL (ref 30.0–36.0)
MCV: 95.7 fL (ref 80.0–100.0)
Monocytes Absolute: 0.8 K/uL (ref 0.1–1.0)
Monocytes Relative: 9 %
Neutro Abs: 4 K/uL (ref 1.7–7.7)
Neutrophils Relative %: 51 %
Platelet Count: 163 K/uL (ref 150–400)
RBC: 4.4 MIL/uL (ref 4.22–5.81)
RDW: 12.6 % (ref 11.5–15.5)
WBC Count: 7.9 K/uL (ref 4.0–10.5)
nRBC: 0 % (ref 0.0–0.2)

## 2024-04-07 LAB — TSH: TSH: 13.3 u[IU]/mL — ABNORMAL HIGH (ref 0.350–4.500)
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
## 2024-04-18 ENCOUNTER — Encounter: Payer: Self-pay | Admitting: Oncology

## 2024-04-18 NOTE — Assessment & Plan Note (Signed)
 Please review oncology history for additional details and timeline of events.  Initial diagnosis was in September 2022.  Treated with concurrent chemoradiation using weekly cisplatin  at that time.  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    Now with recurrence of disease in the left upper lobe and left lower lobe and also mediastinal lymphadenopathy.    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  Clinical picture is indicative of metastatic disease from head and neck primary.  Multiple nodules present in right upper, right lower, and left upper lobes.  - Follow up on PD-L1 test results to determine eligibility for immunotherapy.  - If eligible, initiate immunotherapy with Keytruda every three weeks.  If not a candidate for immunotherapy alone based on PD-L1 results, we will add carboplatin and 5-FU to the regimen along with Keytruda  - Monitor for autoimmune side effects  - Order CT scan of the chest to establish current disease status before starting treatment.  - Coordinate with Delon for scheduling and treatment initiation.  - Provide printed information on Keytruda.  We will coordinate appointments based on PD-L1 testing results

## 2024-04-19 ENCOUNTER — Other Ambulatory Visit: Payer: Self-pay | Admitting: Oncology

## 2024-04-19 DIAGNOSIS — C321 Malignant neoplasm of supraglottis: Secondary | ICD-10-CM

## 2024-04-19 MED ORDER — ONDANSETRON HCL 8 MG PO TABS: 8.0000 mg | ORAL_TABLET | Freq: Three times a day (TID) | ORAL | 1 refills | Status: AC | PRN

## 2024-04-19 MED ORDER — PROCHLORPERAZINE MALEATE 10 MG PO TABS: 10.0000 mg | ORAL_TABLET | Freq: Four times a day (QID) | ORAL | 1 refills | Status: AC | PRN

## 2024-04-19 NOTE — Progress Notes (Signed)
 DISCONTINUE ON PATHWAY REGIMEN - Head and Neck     A cycle is every 7 days:     Cisplatin    **Always confirm dose/schedule in your pharmacy ordering system**  PRIOR TREATMENT: [id number]: Cisplatin  40 mg/m2 q7 Days with Concurrent Radiation  START ON PATHWAY REGIMEN - Head and Neck     A cycle is every 21 days:     Pembrolizumab   **Always confirm dose/schedule in your pharmacy ordering system**  Patient Characteristics: Larynx, Metastatic, Second Line, No Prior PD-1/PD-L1 Inhibitor and Candidate for Immunotherapy Disease Classification: Larynx AJCC T Category: T1 AJCC 8 Stage Grouping: IVC Therapeutic Status: Metastatic Disease AJCC N Category: cN2 AJCC M Category: M1 Line of Therapy: Second Line Intent of Therapy: Non-Curative / Palliative Intent, Discussed with Patient

## 2024-04-21 ENCOUNTER — Other Ambulatory Visit: Payer: Self-pay

## 2024-04-29 ENCOUNTER — Inpatient Hospital Stay: Attending: Oncology

## 2024-04-29 ENCOUNTER — Inpatient Hospital Stay: Admitting: Oncology

## 2024-04-29 ENCOUNTER — Encounter: Payer: Self-pay | Admitting: Oncology

## 2024-04-29 ENCOUNTER — Inpatient Hospital Stay

## 2024-04-29 VITALS — BP 161/67 | HR 63 | Temp 98.6°F | Resp 16

## 2024-04-29 VITALS — BP 137/90 | HR 73 | Temp 98.0°F | Resp 16 | Ht 69.0 in | Wt 121.3 lb

## 2024-04-29 DIAGNOSIS — C321 Malignant neoplasm of supraglottis: Secondary | ICD-10-CM | POA: Insufficient documentation

## 2024-04-29 DIAGNOSIS — Z79620 Long term (current) use of immunosuppressive biologic: Secondary | ICD-10-CM | POA: Insufficient documentation

## 2024-04-29 DIAGNOSIS — C7802 Secondary malignant neoplasm of left lung: Secondary | ICD-10-CM | POA: Insufficient documentation

## 2024-04-29 DIAGNOSIS — Z5112 Encounter for antineoplastic immunotherapy: Secondary | ICD-10-CM | POA: Diagnosis present

## 2024-04-29 LAB — CMP (CANCER CENTER ONLY)
ALT: 8 U/L (ref 0–44)
AST: 28 U/L (ref 15–41)
Albumin: 4.2 g/dL (ref 3.5–5.0)
Alkaline Phosphatase: 76 U/L (ref 38–126)
Anion gap: 9 (ref 5–15)
BUN: 13 mg/dL (ref 8–23)
CO2: 24 mmol/L (ref 22–32)
Calcium: 8.8 mg/dL — ABNORMAL LOW (ref 8.9–10.3)
Chloride: 104 mmol/L (ref 98–111)
Creatinine: 0.79 mg/dL (ref 0.61–1.24)
GFR, Estimated: >60 mL/min (ref >60)
Glucose, Bld: 99 mg/dL (ref 70–99)
Potassium: 4 mmol/L (ref 3.5–5.1)
Sodium: 137 mmol/L (ref 135–145)
Total Bilirubin: 0.5 mg/dL (ref 0.0–1.2)
Total Protein: 6.5 g/dL (ref 6.5–8.1)

## 2024-04-29 LAB — CBC WITH DIFFERENTIAL (CANCER CENTER ONLY)
Abs Immature Granulocytes: 0.01 K/uL (ref 0.00–0.07)
Basophils Absolute: 0 K/uL (ref 0.0–0.1)
Basophils Relative: 0 %
Eosinophils Absolute: 0.1 K/uL (ref 0.0–0.5)
Eosinophils Relative: 2 %
HCT: 39.8 % (ref 39.0–52.0)
Hemoglobin: 13.9 g/dL (ref 13.0–17.0)
Immature Granulocytes: 0 %
Lymphocytes Relative: 33 %
Lymphs Abs: 2.6 K/uL (ref 0.7–4.0)
MCH: 33.3 pg (ref 26.0–34.0)
MCHC: 34.9 g/dL (ref 30.0–36.0)
MCV: 95.4 fL (ref 80.0–100.0)
Monocytes Absolute: 0.7 K/uL (ref 0.1–1.0)
Monocytes Relative: 9 %
Neutro Abs: 4.5 K/uL (ref 1.7–7.7)
Neutrophils Relative %: 56 %
Platelet Count: 144 K/uL — ABNORMAL LOW (ref 150–400)
RBC: 4.17 MIL/uL — ABNORMAL LOW (ref 4.22–5.81)
RDW: 12.5 % (ref 11.5–15.5)
WBC Count: 7.9 K/uL (ref 4.0–10.5)
nRBC: 0 % (ref 0.0–0.2)

## 2024-04-29 LAB — TSH: TSH: 11.2 u[IU]/mL — ABNORMAL HIGH (ref 0.350–4.500)

## 2024-04-29 MED ORDER — SODIUM CHLORIDE 0.9 % IV SOLN
200.0000 mg | Freq: Once | INTRAVENOUS | Status: AC
  Administered 2024-04-29: 200 mg via INTRAVENOUS
  Filled 2024-04-29: qty 200

## 2024-04-29 MED ORDER — SODIUM CHLORIDE 0.9 % IV SOLN: INTRAVENOUS | Status: DC

## 2024-04-29 NOTE — Patient Instructions (Signed)
 [facility] - A DEPT OF [facility]  Discharge Instructions: Thank you for choosing [facility] to provide your oncology and hematology care.   If you have a lab appointment with the Cancer Center, please go directly to the Cancer Center and check in at the registration area.   Wear comfortable clothing and clothing appropriate for easy access to any Portacath or PICC line.   We strive to give you quality time with your provider. You may need to reschedule your appointment if you arrive late (15 or more minutes).  Arriving late affects you and other patients whose appointments are after yours.  Also, if you miss three or more appointments without notifying the office, you may be dismissed from the clinic at the provider's discretion.      For prescription refill requests, have your pharmacy contact our office and allow 72 hours for refills to be completed.    Today you received the following chemotherapy and/or immunotherapy agents Pembrolizumab    To help prevent nausea and vomiting after your treatment, we encourage you to take your nausea medication as directed.  BELOW ARE SYMPTOMS THAT SHOULD BE REPORTED IMMEDIATELY: *FEVER GREATER THAN 100.4 F (38 C) OR HIGHER *CHILLS OR SWEATING *NAUSEA AND VOMITING THAT IS NOT CONTROLLED WITH YOUR NAUSEA MEDICATION *UNUSUAL SHORTNESS OF BREATH *UNUSUAL BRUISING OR BLEEDING *URINARY PROBLEMS (pain or burning when urinating, or frequent urination) *BOWEL PROBLEMS (unusual diarrhea, constipation, pain near the anus) TENDERNESS IN MOUTH AND THROAT WITH OR WITHOUT PRESENCE OF ULCERS (sore throat, sores in mouth, or a toothache) UNUSUAL RASH, SWELLING OR PAIN  UNUSUAL VAGINAL DISCHARGE OR ITCHING   Items with * indicate a potential emergency and should be followed up as soon as possible or go to the Emergency Department if any problems should occur.  Please show the CHEMOTHERAPY ALERT CARD or IMMUNOTHERAPY  ALERT CARD at check-in to the Emergency Department and triage nurse.  Should you have questions after your visit or need to cancel or reschedule your appointment, please contact [facility] - A DEPT OF [facility]  Dept: [phone]  and follow the prompts.  Office hours are 8:00 a.m. to 4:30 p.m. Monday - Friday. Please note that voicemails left after 4:00 p.m. may not be returned until the following business day.  We are closed weekends and major holidays. You have access to a nurse at all times for urgent questions. Please call the main number to the clinic Dept: [phone] and follow the prompts.   For any non-urgent questions, you may also contact your provider using MyChart. We now offer e-Visits for anyone 76 and older to request care online for non-urgent symptoms. For details visit [email or web address].   Also download the MyChart app! Go to the app store, search MyChart, open the app, select [facility], and log in with your MyChart username and password.   Pembrolizumab  Injection What is this medication? PEMBROLIZUMAB  (PEM broe LIZ ue mab) treats some types of cancer. It works by helping your immune system slow or stop the spread of cancer cells. It is a monoclonal antibody. This medicine may be used for other purposes; ask your health care provider or pharmacist if you have questions. COMMON BRAND NAME(S): Keytruda  What should I tell my care team before I take this medication? They need to know if you have any of these conditions: Allogeneic stem cell transplant (uses someone else's stem cells) Autoimmune diseases,  such as Crohn disease, ulcerative colitis, lupus History of chest radiation Nervous system problems, such as Guillain-Barre syndrome, myasthenia gravis Organ transplant An unusual or allergic reaction to pembrolizumab , other medications, foods, dyes, or preservatives Pregnant or trying to get pregnant Breast-feeding How should I  use this medication? This medication is injected into a vein. It is given by your care team in a hospital or clinic setting. A special MedGuide will be given to you before each treatment. Be sure to read this information carefully each time. Talk to your care team about the use of this medication in children. While it may be prescribed for children as young as 6 months for selected conditions, precautions do apply. Overdosage: If you think you have taken too much of this medicine contact a poison control center or emergency room at once. NOTE: This medicine is only for you. Do not share this medicine with others. What if I miss a dose? Keep appointments for follow-up doses. It is important not to miss your dose. Call your care team if you are unable to keep an appointment. What may interact with this medication? Interactions have not been studied. This list may not describe all possible interactions. Give your health care provider a list of all the medicines, herbs, non-prescription drugs, or dietary supplements you use. Also tell them if you smoke, drink alcohol, or use illegal drugs. Some items may interact with your medicine. What should I watch for while using this medication? Your condition will be monitored carefully while you are receiving this medication. You may need blood work while taking this medication. This medication may cause serious skin reactions. They can happen weeks to months after starting the medication. Contact your care team right away if you notice fevers or flu-like symptoms with a rash. The rash may be red or purple and then turn into blisters or peeling of the skin. You may also notice a red rash with swelling of the face, lips, or lymph nodes in your neck or under your arms. Tell your care team right away if you have any change in your eyesight. Talk to your care team if you may be pregnant. Serious birth defects can occur if you take this medication during pregnancy and  for 4 months after the last dose. You will need a negative pregnancy test before starting this medication. Contraception is recommended while taking this medication and for 4 months after the last dose. Your care team can help you find the option that works for you. Do not breastfeed while taking this medication and for 4 months after the last dose. What side effects may I notice from receiving this medication? Side effects that you should report to your care team as soon as possible: Allergic reactions--skin rash, itching, hives, swelling of the face, lips, tongue, or throat Dry cough, shortness of breath or trouble breathing Eye pain, redness, irritation, or discharge with blurry or decreased vision Heart muscle inflammation--unusual weakness or fatigue, shortness of breath, chest pain, fast or irregular heartbeat, dizziness, swelling of the ankles, feet, or hands Hormone gland problems--headache, sensitivity to light, unusual weakness or fatigue, dizziness, fast or irregular heartbeat, increased sensitivity to cold or heat, excessive sweating, constipation, hair loss, increased thirst or amount of urine, tremors or shaking, irritability Infusion reactions--chest pain, shortness of breath or trouble breathing, feeling faint or lightheaded Kidney injury (glomerulonephritis)--decrease in the amount of urine, red or dark brown urine, foamy or bubbly urine, swelling of the ankles, hands, or feet Liver injury--right  upper belly pain, loss of appetite, nausea, light-colored stool, dark yellow or brown urine, yellowing skin or eyes, unusual weakness or fatigue Pain, tingling, or numbness in the hands or feet, muscle weakness, change in vision, confusion or trouble speaking, loss of balance or coordination, trouble walking, seizures Rash, fever, and swollen lymph nodes Redness, blistering, peeling, or loosening of the skin, including inside the mouth Sudden or severe stomach pain, bloody diarrhea, fever,  nausea, vomiting Side effects that usually do not require medical attention (report to your care team if they continue or are bothersome): Bone, joint, or muscle pain Diarrhea Fatigue Loss of appetite Nausea Skin rash This list may not describe all possible side effects. Call your doctor for medical advice about side effects. You may report side effects to FDA at 1-800-FDA-1088. Where should I keep my medication? This medication is given in a hospital or clinic. It will not be stored at home. NOTE: This sheet is a summary. It may not cover all possible information. If you have questions about this medicine, talk to your doctor, pharmacist, or health care provider.  2024 Elsevier/Gold Standard (2021-10-08 00:00:00)

## 2024-04-29 NOTE — Progress Notes (Signed)
Treatment given per orders. Patient tolerated it well without problems. Vitals stable and discharged home from clinic ambulatory. Follow up as scheduled.  

## 2024-04-29 NOTE — Assessment & Plan Note (Signed)
 Please review oncology history for additional details and timeline of events.  Initial diagnosis was in September 2022.  Treated with concurrent chemoradiation using weekly cisplatin  at that time.  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    Now with recurrence of disease in the left upper lobe and left lower lobe and also mediastinal lymphadenopathy.    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  Clinical picture is indicative of metastatic disease from head and neck primary.  Multiple nodules present in right upper, right lower, and left upper lobes.  PD-L1 TPS came back at 95%.  This makes him a candidate for single agent immunotherapy with pembrolizumab .  Previously provided information regarding pembrolizumab .  Discussed risk of autoimmune side effects and need for close monitoring.  Patient verbalized understanding and is agreeable to proceeding with pembrolizumab  treatments.  Labs today reveal no dose-limiting toxicities.  Will proceed with cycle 1 of pembrolizumab  from today, 04/29/2024.  Plan is to continue this every 3 weeks indefinitely as long as there is clinical benefit.  RTC in 3 weeks for continuation of pembrolizumab  treatments.

## 2024-04-29 NOTE — Progress Notes (Signed)
 [facility]  ONCOLOGY CLINIC PROGRESS NOTE   Patient Care Team: Trudy Vaughan HERO, MD as PCP - General (Family Medicine) Izell Domino, MD as Attending Physician (Radiation Oncology) Malmfelt, Delon CROME, RN as Oncology Nurse Navigator Jesus Oliphant, MD as Consulting Physician (Otolaryngology) Autumn Millman, MD as Consulting Physician (Oncology)  PATIENT NAME: Donald Larson   MR#: [id number] DOB: July 09, 1945  Date of visit: 04/29/2024   ASSESSMENT & PLAN:   EDRIS FRIEDT is a 78 y.o. 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  PD-L1 TPS of 95%.  Started treatment with pembrolizumab  from 04/29/2024.  Cancer of supraglottis [facility]) Please review oncology history for additional details and timeline of events.  Initial diagnosis was in September 2022.  Treated with concurrent chemoradiation using weekly cisplatin  at that time.  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    Now with recurrence of disease in the left upper lobe and left lower lobe and also mediastinal lymphadenopathy.    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  Clinical picture is indicative of metastatic disease from head and neck primary.  Multiple nodules present in right upper, right lower, and left upper lobes.  PD-L1 TPS came back at 95%.  This makes him a candidate for single agent immunotherapy with pembrolizumab .  Previously provided information regarding pembrolizumab .  Discussed risk of autoimmune side effects and need for close monitoring.  Patient verbalized understanding and is agreeable to proceeding with pembrolizumab  treatments.  Labs today reveal no dose-limiting toxicities.  Will proceed with cycle 1 of pembrolizumab  from today,  04/29/2024.  Plan is to continue this every 3 weeks indefinitely as long as there is clinical benefit.  RTC in 3 weeks for continuation of pembrolizumab  treatments.    I reviewed lab results and outside records for this visit and discussed relevant results with the patient. Diagnosis, plan of care and treatment options were also discussed in detail with the patient. Opportunity provided to ask questions and answers provided to his apparent satisfaction. Provided instructions to call our clinic with any problems, questions or concerns prior to return visit. I recommended to continue follow-up with PCP and sub-specialists. He verbalized understanding and agreed with the plan.   NCCN guidelines have been consulted in the planning of this patient's care.  I spent a total of 42 minutes during this encounter with the patient including review of chart and various tests results, discussions about plan of care and coordination of care plan.   Millman Autumn, MD  04/29/2024 5:25 PM  [facility] - A DEPT OF [facility] [address] [place] [address] Dept: [phone] Dept Fax: [phone]    CHIEF COMPLAINT/ REASON FOR VISIT:   Metastatic squamous cell carcinoma of the supraglottis with biopsy-proven lung mets.  PD-L1 TPS of 95%  Current Treatment: Started pembrolizumab  treatments from 04/29/2024.  INTERVAL HISTORY:    Discussed the use of AI scribe software for clinical note transcription with the patient, who gave verbal consent to proceed.  History of Present Illness Donald Larson is a 78 year old male who presents for initiation of immunotherapy treatment for cancer.  He has a positive mutation for which camrelizumab, an immunotherapy agent administered as an IV infusion every three weeks,  was discussed as a treatment option.  He has been provided with a prescription for nausea medication as a precaution,  although the treatment is generally well tolerated and is not expected to cause nausea or vomiting. He may experience fatigue following the treatment.  There is a potential for autoimmune side effects affecting organs such as the thyroid , lungs, liver, or kidneys, which will be monitored closely.    I have reviewed the past medical history, past surgical history, social history and family history with the patient and they are unchanged from previous note.  HISTORY OF PRESENT ILLNESS:   ONCOLOGY HISTORY:   He initially presented with a right neck mass for the last few months.  He was evaluated by Dr. Mable in September 2022 found to have an epiglottic mass measuring 2.2 cm with right-sided lymph node involvement.  Directed endoscopy at that time revealed a soft tissue mass in the right aryepiglottic fold and neck mass measuring 2 cm on examination.  Biopsy at that time revealed a moderately differentiated invasive squamous cell carcinoma with fine-needle aspiration of the right supraglottic neck mass revealed malignant squamous cell carcinoma.  PET scan obtained at that time which showed a hypermetabolic uptake in the right supraglottic in the right dominant neck node.    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 The patient initially was treated at 2 Gray per fraction.  He received 46 Gray in 23 fractions.  He then expressed reluctance to complete treatment.  He did not show for appointments as scheduled. Therefore upon further discussion with Dr. Izell, he agreed to continue treatment at a hypofractionated technique so that he could come in for less visits.  He tolerated an additional 6 fractions at 2.7 Gray per fraction to yield 16.2 Elnor more.   Total dose received was 62.2 Gray in 29 fractions.  He refused further treatment after this point in time.   His treatment elapsed over 2 months with breaks that were against medical advice.   Radiation therapy and weekly cisplatin  started on May 22, 2021.  Therapy concluded in January 2023.    PET scan obtained in May 2023 did not show any evidence of disease relapse.    CT chest in November 2023 showed 9 mm left upper lobe lung nodule which was increased in size compared to prior.   He underwent fiberoptic bronchoscopy in May 2024 and biopsy of the right upper lobe lung nodule and also of the left upper lobe lung nodule showed non-small cell carcinoma.  Tumor cells were strongly positive for p40 and CK5/6 and negative for TTF-1, favoring squamous cell carcinoma.   It was felt to be bilateral stage I primary lung cancers versus bilateral metastasis to the lungs.   Patient opted to proceed with SBRT to both lung lesions.    He continued to follow-up with Dr. Izell.  On 11/27/2023, follow-up PET scan showed intense hypermetabolic right infrahilar lymph node consistent with nodal metastasis and new small hypermetabolic pulmonary nodules in the right upper lobe and right lower lobe consistent with lung metastasis.  Enlarging hypermetabolic pleural-based nodule in the left upper lobe consistent with pulmonary metastasis.   On 02/02/2024, Dr. Shelah performed biopsy of left lower lobe lung nodule which came back positive for squamous cell carcinoma.  Brushings from left upper lobe also showed findings consistent with squamous cell carcinoma.   At this  point, he was referred to us  for consideration of systemic treatments   We requested PD-L1 testing on the specimen.    PD-L1 TPS came back at 95%.  This makes him a candidate for single agent immunotherapy with pembrolizumab .  Started pembrolizumab  from 04/29/2024.  Plan to continue every 3 weeks indefinitely   Oncology History  Cancer of supraglottis (HCC)  04/30/2021 Initial Diagnosis   Cancer of supraglottis (HCC)   04/30/2021 Cancer Staging    Staging form: Larynx - Supraglottis, AJCC 8th Edition - Clinical stage from 04/30/2021: Stage IVA (cT1, cN2a, cM0) - Signed by Izell Domino, MD on 04/30/2021 Stage prefix: Initial diagnosis   05/22/2021 - 06/19/2021 Chemotherapy   Patient is on Treatment Plan : HEAD/NECK Cisplatin  q7d     04/29/2024 -  Chemotherapy   Patient is on Treatment Plan : HEAD/NECK Pembrolizumab  (200) q21d         REVIEW OF SYSTEMS:   Review of Systems - Oncology  All other pertinent systems were reviewed with the patient and are negative.  ALLERGIES: He has no known allergies.  MEDICATIONS:  Current Outpatient Medications  Medication Sig Dispense Refill   Aspirin-Caffeine (BC FAST PAIN RELIEF PO) Take 2 packets by mouth daily as needed (pain).     levothyroxine  (SYNTHROID ) 25 MCG tablet Take 1 tablet PO on empty stomach every morning, 1 hour before other medications, vitamins, coffee, or breakfast. 90 tablet 3   omeprazole  (PRILOSEC) 20 MG capsule Take 20 mg by mouth daily.     ondansetron  (ZOFRAN ) 4 MG tablet Take 4 mg by mouth every 8 (eight) hours as needed for nausea or vomiting.     ondansetron  (ZOFRAN ) 8 MG tablet Take 1 tablet (8 mg total) by mouth every 8 (eight) hours as needed for nausea or vomiting. 30 tablet 1   Oxycodone  HCl 10 MG TABS Take 10 mg by mouth every 6 (six) hours as needed (pain).     prochlorperazine  (COMPAZINE ) 10 MG tablet Take 1 tablet (10 mg total) by mouth every 6 (six) hours as needed for nausea or vomiting. 30 tablet 1   No current facility-administered medications for this visit.   Facility-Administered Medications Ordered in Other Visits  Medication Dose Route Frequency Provider Last Rate Last Admin   0.9 %  sodium chloride  infusion   Intravenous Continuous Rowene Suto, MD   Stopped at 04/29/24 1545     VITALS:   Blood pressure (!) 137/90, pulse 73, temperature 98 F (36.7 C), temperature source Temporal, resp. rate 16, height 5' 9 (1.753 m), weight 121 lb  4.8 oz (55 kg), SpO2 100%.  Wt Readings from Last 3 Encounters:  04/29/24 121 lb 4.8 oz (55 kg)  04/07/24 119 lb 8 oz (54.2 kg)  02/02/24 120 lb (54.4 kg)    Body mass index is 17.91 kg/m.   Onc Performance Status - 04/29/24 1300       ECOG Perf Status   ECOG Perf Status Restricted in physically strenuous activity but ambulatory and able to carry out work of a light or sedentary nature, e.g., light house work, office work      KPS SCALE   KPS % SCORE Normal activity with effort, some s/s of disease          PHYSICAL EXAM:   Physical Exam Constitutional:      General: He is not in acute distress.    Appearance: Normal appearance.  HENT:     Head: Normocephalic and atraumatic.  Eyes:  Comments: Left eye cataract noted  Cardiovascular:     Rate and Rhythm: Normal rate and regular rhythm.  Pulmonary:     Effort: Pulmonary effort is normal. No respiratory distress.  Abdominal:     General: There is no distension.  Neurological:     General: No focal deficit present.     Mental Status: He is alert and oriented to person, place, and time.  Psychiatric:        Mood and Affect: Mood normal.        Behavior: Behavior normal.      LABORATORY DATA:   I have reviewed the data as listed.  Results for orders placed or performed in visit on 04/29/24  TSH  Result Value Ref Range   TSH 11.200 (H) 0.350 - 4.500 uIU/mL  CMP (Cancer Center only)  Result Value Ref Range   Sodium 137 135 - 145 mmol/L   Potassium 4.0 3.5 - 5.1 mmol/L   Chloride 104 98 - 111 mmol/L   CO2 24 22 - 32 mmol/L   Glucose, Bld 99 70 - 99 mg/dL   BUN 13 8 - 23 mg/dL   Creatinine 9.20 9.38 - 1.24 mg/dL   Calcium 8.8 (L) 8.9 - 10.3 mg/dL   Total Protein 6.5 6.5 - 8.1 g/dL   Albumin 4.2 3.5 - 5.0 g/dL   AST 28 15 - 41 U/L   ALT 8 0 - 44 U/L   Alkaline Phosphatase 76 38 - 126 U/L   Total Bilirubin 0.5 0.0 - 1.2 mg/dL   GFR, Estimated >39 >39 mL/min   Anion gap 9 5 - 15  CBC with Differential  (Cancer Center Only)  Result Value Ref Range   WBC Count 7.9 4.0 - 10.5 K/uL   RBC 4.17 (L) 4.22 - 5.81 MIL/uL   Hemoglobin 13.9 13.0 - 17.0 g/dL   HCT 60.1 60.9 - 47.9 %   MCV 95.4 80.0 - 100.0 fL   MCH 33.3 26.0 - 34.0 pg   MCHC 34.9 30.0 - 36.0 g/dL   RDW 87.4 88.4 - 84.4 %   Platelet Count 144 (L) 150 - 400 K/uL   nRBC 0.0 0.0 - 0.2 %   Neutrophils Relative % 56 %   Neutro Abs 4.5 1.7 - 7.7 K/uL   Lymphocytes Relative 33 %   Lymphs Abs 2.6 0.7 - 4.0 K/uL   Monocytes Relative 9 %   Monocytes Absolute 0.7 0.1 - 1.0 K/uL   Eosinophils Relative 2 %   Eosinophils Absolute 0.1 0.0 - 0.5 K/uL   Basophils Relative 0 %   Basophils Absolute 0.0 0.0 - 0.1 K/uL   Immature Granulocytes 0 %   Abs Immature Granulocytes 0.01 0.00 - 0.07 K/uL      RADIOGRAPHIC STUDIES:  I have personally reviewed the radiological images as listed and agree with the findings in the report.  DG Chest Port 1 View CLINICAL DATA:  Status post bronchoscopic biopsy  EXAM: PORTABLE CHEST - 1 VIEW  COMPARISON:  02/02/2023  FINDINGS: Cardiomediastinal silhouette and pulmonary vasculature are within normal limits.  No pneumothorax. Spiculated opacity seen at the RIGHT lung apex. Nodular opacity with marker noted in the RIGHT suprahilar lung. Additional marker seen in the LEFT upper lung is unchanged from prior exam.  IMPRESSION: No pneumothorax status post bronchoscopic biopsy.  Electronically Signed   By: Aliene Lloyd M.D.   On: 02/02/2024 14:27 DG C-Arm 1-60 Min-No Report Fluoroscopy was utilized by the requesting physician.  No  radiographic  interpretation.  DG C-ARM BRONCHOSCOPY C-ARM BRONCHOSCOPY:  Fluoroscopy was utilized by the requesting physician.  No radiographic  interpretation.    CODE STATUS:  Code Status History     Date Active Date Inactive Code Status Order ID Comments User Context   02/02/2023 0033 02/02/2023 2226 DNR [id number]  Ricky Alfrieda DASEN, DO ED    Questions for Most  Recent Historical Code Status (Order [id number])     Question Answer   If patient has no pulse and is not breathing Do Not Attempt Resuscitation   If patient has a pulse and/or is breathing: Medical Treatment Goals LIMITED ADDITIONAL INTERVENTIONS: Use medication/IV fluids and cardiac monitoring as indicated; Do not use intubation or mechanical ventilation (DNI), also provide comfort medications.  Transfer to Progressive/Stepdown as indicated, avoid Intensive Care.   Consent: Discussion documented in EHR or advanced directives reviewed            Orders Placed This Encounter  Procedures   CBC with Differential (Cancer Center Only)    Standing Status:   Future    Expected Date:   07/01/2024    Expiration Date:   07/01/2025   CMP (Cancer Center only)    Standing Status:   Future    Expected Date:   07/01/2024    Expiration Date:   07/01/2025     Future Appointments  Date Time Provider Department Center  05/20/2024  8:30 AM [facility] LAB [facility] None  05/20/2024  9:00 AM Zandria Woldt, MD [facility] None  05/20/2024  9:30 AM [facility] INFUSION [facility] None  05/20/2024  9:45 AM Ivonne Harlene RAMAN, RD [facility] None  06/10/2024  9:00 AM [facility] LAB [facility] None  06/10/2024  9:45 AM [facility] INFUSION [facility] None      This document was completed utilizing speech recognition software. Grammatical errors, random word insertions, pronoun errors, and incomplete sentences are an occasional consequence of this system due to software limitations, ambient noise, and hardware issues. Any formal questions or concerns about the content, text or information contained within the body of this dictation should be directly addressed to the provider for clarification.

## 2024-04-30 LAB — T4: T4, Total: 8.1 ug/dL (ref 4.5–12.0)

## 2024-05-01 ENCOUNTER — Other Ambulatory Visit: Payer: Self-pay

## 2024-05-02 ENCOUNTER — Telehealth: Payer: Self-pay

## 2024-05-02 NOTE — Telephone Encounter (Signed)
-----   Message from Nurse Cortney P sent at 04/29/2024  3:58 PM EST ----- Regarding: Dr Autumn, first time Keytruda  Dr Autumn pt, first time Keytruda . Patient tolerated treatment well, no complaints

## 2024-05-20 ENCOUNTER — Encounter: Payer: Self-pay | Admitting: Oncology

## 2024-05-20 ENCOUNTER — Other Ambulatory Visit: Payer: Self-pay

## 2024-05-20 ENCOUNTER — Inpatient Hospital Stay: Admitting: Dietician

## 2024-05-20 ENCOUNTER — Inpatient Hospital Stay: Admitting: Oncology

## 2024-05-20 ENCOUNTER — Inpatient Hospital Stay

## 2024-05-20 ENCOUNTER — Inpatient Hospital Stay: Attending: Oncology

## 2024-05-20 VITALS — BP 150/62 | HR 69 | Temp 97.8°F | Resp 17 | Ht 69.0 in | Wt 118.8 lb

## 2024-05-20 DIAGNOSIS — C321 Malignant neoplasm of supraglottis: Secondary | ICD-10-CM

## 2024-05-20 DIAGNOSIS — E038 Other specified hypothyroidism: Secondary | ICD-10-CM | POA: Diagnosis not present

## 2024-05-20 DIAGNOSIS — C3432 Malignant neoplasm of lower lobe, left bronchus or lung: Secondary | ICD-10-CM | POA: Insufficient documentation

## 2024-05-20 DIAGNOSIS — Z79620 Long term (current) use of immunosuppressive biologic: Secondary | ICD-10-CM | POA: Insufficient documentation

## 2024-05-20 DIAGNOSIS — C3412 Malignant neoplasm of upper lobe, left bronchus or lung: Secondary | ICD-10-CM | POA: Diagnosis present

## 2024-05-20 DIAGNOSIS — R630 Anorexia: Secondary | ICD-10-CM | POA: Diagnosis not present

## 2024-05-20 DIAGNOSIS — Z5112 Encounter for antineoplastic immunotherapy: Secondary | ICD-10-CM | POA: Insufficient documentation

## 2024-05-20 LAB — CMP (CANCER CENTER ONLY)
ALT: 6 U/L (ref 0–44)
AST: 24 U/L (ref 15–41)
Albumin: 4 g/dL (ref 3.5–5.0)
Alkaline Phosphatase: 86 U/L (ref 38–126)
Anion gap: 10 (ref 5–15)
BUN: 15 mg/dL (ref 8–23)
CO2: 25 mmol/L (ref 22–32)
Calcium: 8.8 mg/dL — ABNORMAL LOW (ref 8.9–10.3)
Chloride: 104 mmol/L (ref 98–111)
Creatinine: 0.92 mg/dL (ref 0.61–1.24)
GFR, Estimated: >60 mL/min (ref >60)
Glucose, Bld: 98 mg/dL (ref 70–99)
Potassium: 4.1 mmol/L (ref 3.5–5.1)
Sodium: 139 mmol/L (ref 135–145)
Total Bilirubin: 0.7 mg/dL (ref 0.0–1.2)
Total Protein: 6.5 g/dL (ref 6.5–8.1)

## 2024-05-20 LAB — CBC WITH DIFFERENTIAL (CANCER CENTER ONLY)
Abs Immature Granulocytes: 0.05 K/uL (ref 0.00–0.07)
Basophils Absolute: 0 K/uL (ref 0.0–0.1)
Basophils Relative: 1 %
Eosinophils Absolute: 0.2 K/uL (ref 0.0–0.5)
Eosinophils Relative: 3 %
HCT: 41 % (ref 39.0–52.0)
Hemoglobin: 14.2 g/dL (ref 13.0–17.0)
Immature Granulocytes: 1 %
Lymphocytes Relative: 33 %
Lymphs Abs: 2.1 K/uL (ref 0.7–4.0)
MCH: 33.1 pg (ref 26.0–34.0)
MCHC: 34.6 g/dL (ref 30.0–36.0)
MCV: 95.6 fL (ref 80.0–100.0)
Monocytes Absolute: 0.5 K/uL (ref 0.1–1.0)
Monocytes Relative: 8 %
Neutro Abs: 3.5 K/uL (ref 1.7–7.7)
Neutrophils Relative %: 54 %
Platelet Count: 154 K/uL (ref 150–400)
RBC: 4.29 MIL/uL (ref 4.22–5.81)
RDW: 12.3 % (ref 11.5–15.5)
WBC Count: 6.5 K/uL (ref 4.0–10.5)
nRBC: 0 % (ref 0.0–0.2)

## 2024-05-20 MED ORDER — SODIUM CHLORIDE 0.9 % IV SOLN: INTRAVENOUS | Status: DC

## 2024-05-20 MED ORDER — LEVOTHYROXINE SODIUM 25 MCG PO TABS: ORAL_TABLET | ORAL | 3 refills | Status: AC

## 2024-05-20 MED ORDER — SODIUM CHLORIDE 0.9 % IV SOLN
200.0000 mg | Freq: Once | INTRAVENOUS | Status: AC
  Administered 2024-05-20: 200 mg via INTRAVENOUS
  Filled 2024-05-20: qty 200

## 2024-05-20 MED ORDER — MEGESTROL ACETATE 625 MG/5ML PO SUSP: 625.0000 mg | Freq: Every day | ORAL | 0 refills | Status: AC

## 2024-05-20 NOTE — Assessment & Plan Note (Signed)
-   Likely from previous radiation treatments.  He was supposed to be on levothyroxine  25 mcg daily but he has not been compliant with it.  Discussed importance of thyroid  replacement and prescription sent to his pharmacy again.  We will recheck TFTs on return visit and adjust levothyroxine  dose as needed, if persistent elevation in TSH is noted.

## 2024-05-20 NOTE — Progress Notes (Signed)
 Nutrition Assessment   Reason for Assessment: HNC   ASSESSMENT: 78 year old male with recurrent SCC of supraglottis in left upper/lower lung and mediastinal lymphadenopathy (diagnosed 2022). S/p concurrent chemoradiation with weekly cisplatin . Patient received SBRT to lung lesions (08/2023). Currently receiving immunotherapy with keytruda  q21d.   Last seen by this RD 07/17/21  Met with patient in infusion. Reports tolerating keytruda  well without NIS. He has no appetite. Eating small portion at breakfast and dinner. Patient lives with grandson. Reports eating whatever he fixes. Bowl of grits for breakfast. Ate a sandwich last night. Patient currently not drinking ONS due to cost. States he got burnt out on Ensure during chemoRT, but finds the vanilla flavor tolerable.   Nutrition Focused Physical Exam:   Orbital Region: severe Buccal Region: severe Upper Arm Region: severe Thoracic and Lumbar Region: uta Temple Region: severe Clavicle Bone Region: severe Shoulder and Acromion Bone Region: severe Scapular Bone Region: uta Dorsal Hand: moderate Patellar Region: uta Anterior Thigh Region: uta Posterior Calf Region: uta Edema (RD assessment): uta Hair: reviewed  Eyes: right eye blindness  Mouth: uta Skin: uta, dry hands Nails: reviewed   Medications: amlodipine, hydrochlorothiazide , synthroid , prilosec, zofran , oxycodone , compazine , trazodone   Labs: reviewed    Anthropometrics:   Height: 5'9 Weight: 118 lb 12.8 oz  UBW: 124-129 lb (2024) BMI: 17.54   NUTRITION DIAGNOSIS: Inadequate oral intake related to poor appetite as evidenced by pt report, dietary recall, underweight    MALNUTRITION DIAGNOSIS: Patient underweight and meets criteria for 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

## 2024-05-20 NOTE — Assessment & Plan Note (Signed)
 Please review oncology history for additional details and timeline of events.  Initial diagnosis was in September 2022.  Treated with concurrent chemoradiation using weekly cisplatin  at that time.  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    Now with recurrence of disease in the left upper lobe and left lower lobe and also mediastinal lymphadenopathy.    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  Clinical picture is indicative of metastatic disease from head and neck primary.  Multiple nodules present in right upper, right lower, and left upper lobes.  PD-L1 TPS came back at 95%.  This makes him a candidate for single agent immunotherapy with pembrolizumab .  Previously provided information regarding pembrolizumab .  Discussed risk of autoimmune side effects and need for close monitoring.  Patient verbalized understanding and is agreeable to proceeding with pembrolizumab  treatments.  Started pembrolizumab  treatments from 04/29/2024.  Labs today reveal no dose-limiting toxicities.  Will proceed with pembrolizumab  today.  Plan is to continue this every 3 weeks indefinitely as long as there is clinical benefit.  RTC in 3 weeks for labs and continuation of pembrolizumab  treatments.  RTC in 6 weeks for labs, follow-up and pembrolizumab  treatment.

## 2024-05-20 NOTE — Progress Notes (Signed)
 [name]  ONCOLOGY CLINIC PROGRESS NOTE   Patient Care Team: Trudy Vaughan HERO, MD as PCP - General (Family Medicine) Izell Domino, MD as Attending Physician (Radiation Oncology) Malmfelt, Delon CROME, RN as Oncology Nurse Navigator Jesus Oliphant, MD as Consulting Physician (Otolaryngology) Autumn Millman, MD as Consulting Physician (Oncology)  PATIENT NAME: Donald Larson   MR#: [id number] DOB: 26-Jul-1945  Date of visit: 05/20/2024   ASSESSMENT & PLAN:   ANDONI BUSCH is a 78 y.o. 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  PD-L1 TPS of 95%.  Started treatment with pembrolizumab  from 04/29/2024.  Cancer of supraglottis [facility]) Please review oncology history for additional details and timeline of events.  Initial diagnosis was in September 2022.  Treated with concurrent chemoradiation using weekly cisplatin  at that time.  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    Now with recurrence of disease in the left upper lobe and left lower lobe and also mediastinal lymphadenopathy.    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  Clinical picture is indicative of metastatic disease from head and neck primary.  Multiple nodules present in right upper, right lower, and left upper lobes.  PD-L1 TPS came back at 95%.  This makes him a candidate for single agent immunotherapy with pembrolizumab .  Previously provided information regarding pembrolizumab .  Discussed risk of autoimmune side effects and need for close monitoring.  Patient verbalized understanding and is agreeable to proceeding with pembrolizumab  treatments.  Started pembrolizumab  treatments from 04/29/2024.  Labs today reveal no dose-limiting toxicities.  Will  proceed with pembrolizumab  today.  Plan is to continue this every 3 weeks indefinitely as long as there is clinical benefit.  RTC in 3 weeks for labs and continuation of pembrolizumab  treatments.  RTC in 6 weeks for labs, follow-up and pembrolizumab  treatment.  Other specified hypothyroidism - Likely from previous radiation treatments.  He was supposed to be on levothyroxine  25 mcg daily but he has not been compliant with it.  Discussed importance of thyroid  replacement and prescription sent to his pharmacy again.  We will recheck TFTs on return visit and adjust levothyroxine  dose as needed, if persistent elevation in TSH is noted.  Anorexia and weight loss secondary to malignancy  He has persistent anorexia and substantial weight loss following prior chemotherapy, with ongoing poor appetite and early satiety, negatively affecting nutritional status and quality of life. - Prescribed liquid Megace once daily to stimulate appetite. - Sent prescription to [facility] [address].   I reviewed lab results and outside records for this visit and discussed relevant results with the patient. Diagnosis, plan of care and treatment options were also discussed in detail with the patient. Opportunity provided to ask questions and answers provided to his apparent satisfaction. Provided instructions to call our clinic with any problems, questions or concerns prior to return visit. I recommended to continue follow-up with PCP and sub-specialists. He verbalized understanding and agreed with the plan.   NCCN guidelines have been consulted in the planning of this patients care.  I spent a total of 42 minutes during this encounter with the patient including review of chart and various tests results, discussions about plan of care and coordination of care plan.   Millman Autumn, MD  05/20/2024 9:51 AM  [facility]  [facility] - A DEPT [facility] [address] [place] [address] Dept: [phone] Dept Fax: [phone]    CHIEF COMPLAINT/ REASON FOR VISIT:   Metastatic squamous cell carcinoma of the supraglottis with biopsy-proven lung mets.  PD-L1 TPS of 95%  Current Treatment: Started pembrolizumab  treatments from 04/29/2024.  INTERVAL HISTORY:    Discussed the use of AI scribe software for clinical note transcription with the patient, who gave verbal consent to proceed.  History of Present Illness  Donald Larson is a 78 year old male with malignant neoplasm of the supraglottis who presents for scheduled immunotherapy infusion.  He reports significant fatigue, describing profound lack of energy and feeling shut down, ever since he received chemotherapy previously.  Currently he is receiving immunotherapy.  He endorses persistent nausea and marked anorexia, stating he is only able to consume minimal amounts of food before losing interest. He denies vomiting.  He has experienced a total weight loss of approximately forty pounds since prior chemotherapy two years ago, with ongoing difficulty regaining weight. His current weight is 120 pounds, down from a baseline of 160 pounds, and he has lost additional weight since his last visit.    I have reviewed the past medical history, past surgical history, social history and family history with the patient and they are unchanged from previous note.  HISTORY OF PRESENT ILLNESS:   ONCOLOGY HISTORY:   He initially presented with a right neck mass for the last few months.  He was evaluated by Dr. Mable in September 2022 found to have an epiglottic mass measuring 2.2 cm with right-sided lymph node involvement.  Directed endoscopy at that time revealed a soft tissue mass in the right aryepiglottic fold and neck mass measuring 2 cm on examination.  Biopsy at that time revealed a moderately differentiated invasive squamous cell carcinoma with fine-needle aspiration of the right  supraglottic neck mass revealed malignant squamous cell carcinoma.  PET scan obtained at that time which showed a hypermetabolic uptake in the right supraglottic in the right dominant neck node.    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 The patient initially was treated at 2 Gray per fraction.  He received 46 Gray in 23 fractions.  He then expressed reluctance to complete treatment.  He did not show for appointments as scheduled. Therefore upon further discussion with Dr. Izell, he agreed to continue treatment at a hypofractionated technique so that he could come in for less visits.  He tolerated an additional 6 fractions at 2.7 Gray per fraction to yield 16.2 Elnor more.   Total dose received was 62.2 Gray in 29 fractions.  He refused further treatment after this point in time. His treatment elapsed over 2 months with breaks that were against medical advice.   Radiation therapy and weekly cisplatin  started on May 22, 2021.  Therapy concluded in January 2023.    PET scan obtained in May 2023 did not show any evidence of disease relapse.    CT chest in November 2023 showed 9 mm left upper lobe lung nodule which was increased in size compared to prior.   He underwent fiberoptic bronchoscopy in May 2024 and biopsy of the right upper lobe lung nodule and also of the left upper lobe lung nodule showed non-small cell carcinoma.  Tumor cells were strongly positive for p40 and CK5/6  and negative for TTF-1, favoring squamous cell carcinoma.   It was felt to be bilateral stage I primary lung cancers versus bilateral metastasis to the lungs.   Patient opted to proceed with SBRT to both lung lesions.    He continued to follow-up with Dr. Izell. On 11/27/2023, follow-up PET scan showed intense hypermetabolic right infrahilar lymph node consistent with nodal  metastasis and new small hypermetabolic pulmonary nodules in the right upper lobe and right lower lobe consistent with lung metastasis.  Enlarging hypermetabolic pleural-based nodule in the left upper lobe consistent with pulmonary metastasis.   On 02/02/2024, Dr. Shelah performed biopsy of left lower lobe lung nodule which came back positive for squamous cell carcinoma.  Brushings from left upper lobe also showed findings consistent with squamous cell carcinoma.   At this point, he was referred to us  for consideration of systemic treatments   We requested PD-L1 testing on the specimen.    PD-L1 TPS came back at 95%.  This makes him a candidate for single agent immunotherapy with pembrolizumab .  Started pembrolizumab  from 04/29/2024.  Plan to continue every 3 weeks indefinitely   Oncology History  Cancer of supraglottis (HCC)  04/30/2021 Initial Diagnosis   Cancer of supraglottis (HCC)   04/30/2021 Cancer Staging   Staging form: Larynx - Supraglottis, AJCC 8th Edition - Clinical stage from 04/30/2021: Stage IVA (cT1, cN2a, cM0) - Signed by Izell Domino, MD on 04/30/2021 Stage prefix: Initial diagnosis   05/22/2021 - 06/19/2021 Chemotherapy   Patient is on Treatment Plan : HEAD/NECK Cisplatin  q7d     04/29/2024 -  Chemotherapy   Patient is on Treatment Plan : HEAD/NECK Pembrolizumab  (200) q21d         REVIEW OF SYSTEMS:   Review of Systems - Oncology  All other pertinent systems were reviewed with the patient and are negative.  ALLERGIES: He has no known allergies.  MEDICATIONS:  Current Outpatient Medications  Medication Sig Dispense Refill   Aspirin-Caffeine (BC FAST PAIN RELIEF PO) Take 2 packets by mouth daily as needed (pain).     megestrol (MEGACE ES) 625 MG/5ML suspension Take 5 mLs (625 mg total) by mouth daily. 150 mL 0   omeprazole  (PRILOSEC) 20 MG capsule Take 20 mg by mouth daily.     ondansetron  (ZOFRAN ) 4 MG tablet Take 4 mg by mouth every 8 (eight) hours as  needed for nausea or vomiting.     ondansetron  (ZOFRAN ) 8 MG tablet Take 1 tablet (8 mg total) by mouth every 8 (eight) hours as needed for nausea or vomiting. 30 tablet 1   Oxycodone  HCl 10 MG TABS Take 10 mg by mouth every 6 (six) hours as needed (pain).     prochlorperazine  (COMPAZINE ) 10 MG tablet Take 1 tablet (10 mg total) by mouth every 6 (six) hours as needed for nausea or vomiting. 30 tablet 1   amLODipine (NORVASC) 10 MG tablet Take 10 mg by mouth daily. (Patient not taking: Reported on 05/20/2024)     hydrochlorothiazide  (HYDRODIURIL ) 12.5 MG tablet Take 12.5 mg by mouth daily. (Patient not taking: Reported on 05/20/2024)     levothyroxine  (SYNTHROID ) 25 MCG tablet Take 1 tablet PO on empty stomach every morning, 1 hour before other medications, vitamins, coffee, or breakfast. 90 tablet 3   traZODone (DESYREL) 50 MG tablet Take 50 mg by mouth at bedtime as needed for sleep. (Patient not taking: Reported on 05/20/2024)     No current facility-administered medications for this visit.   Facility-Administered Medications  Ordered in Other Visits  Medication Dose Route Frequency Provider Last Rate Last Admin   0.9 %  sodium chloride  infusion   Intravenous Continuous Fedor Kazmierski, MD 10 mL/hr at 05/20/24 0945 New Bag at 05/20/24 0945   pembrolizumab  (KEYTRUDA ) 200 mg in sodium chloride  0.9 % 50 mL chemo infusion  200 mg Intravenous Once Francois Elk, MD         VITALS:   Blood pressure (!) 150/62, pulse 69, temperature 97.8 F (36.6 C), temperature source Oral, resp. rate 17, height 5' 9 (1.753 m), weight 118 lb 12.8 oz (53.9 kg), SpO2 96%.  Wt Readings from Last 3 Encounters:  05/20/24 118 lb 12.8 oz (53.9 kg)  04/29/24 121 lb 4.8 oz (55 kg)  04/07/24 119 lb 8 oz (54.2 kg)    Body mass index is 17.54 kg/m.   Onc Performance Status - 05/20/24 0902       ECOG Perf Status   ECOG Perf Status Restricted in physically strenuous activity but ambulatory and able to carry out work  of a light or sedentary nature, e.g., light house work, office work      KPS SCALE   KPS % SCORE Normal activity with effort, some s/s of disease          PHYSICAL EXAM:   Physical Exam Constitutional:      General: He is not in acute distress.    Appearance: Normal appearance.  HENT:     Head: Normocephalic and atraumatic.  Eyes:     Comments: Left eye cataract noted  Cardiovascular:     Rate and Rhythm: Normal rate and regular rhythm.  Pulmonary:     Effort: Pulmonary effort is normal. No respiratory distress.  Abdominal:     General: There is no distension.  Neurological:     General: No focal deficit present.     Mental Status: He is alert and oriented to person, place, and time.  Psychiatric:        Mood and Affect: Mood normal.        Behavior: Behavior normal.      LABORATORY DATA:   I have reviewed the data as listed.  Results for orders placed or performed in visit on 05/20/24  CMP (Cancer Center only)  Result Value Ref Range   Sodium 139 135 - 145 mmol/L   Potassium 4.1 3.5 - 5.1 mmol/L   Chloride 104 98 - 111 mmol/L   CO2 25 22 - 32 mmol/L   Glucose, Bld 98 70 - 99 mg/dL   BUN 15 8 - 23 mg/dL   Creatinine 9.07 9.38 - 1.24 mg/dL   Calcium 8.8 (L) 8.9 - 10.3 mg/dL   Total Protein 6.5 6.5 - 8.1 g/dL   Albumin 4.0 3.5 - 5.0 g/dL   AST 24 15 - 41 U/L   ALT 6 0 - 44 U/L   Alkaline Phosphatase 86 38 - 126 U/L   Total Bilirubin 0.7 0.0 - 1.2 mg/dL   GFR, Estimated >39 >39 mL/min   Anion gap 10 5 - 15  CBC with Differential (Cancer Center Only)  Result Value Ref Range   WBC Count 6.5 4.0 - 10.5 K/uL   RBC 4.29 4.22 - 5.81 MIL/uL   Hemoglobin 14.2 13.0 - 17.0 g/dL   HCT 58.9 60.9 - 47.9 %   MCV 95.6 80.0 - 100.0 fL   MCH 33.1 26.0 - 34.0 pg   MCHC 34.6 30.0 - 36.0 g/dL   RDW 87.6 88.4 -  15.5 %   Platelet Count 154 150 - 400 K/uL   nRBC 0.0 0.0 - 0.2 %   Neutrophils Relative % 54 %   Neutro Abs 3.5 1.7 - 7.7 K/uL   Lymphocytes Relative 33 %    Lymphs Abs 2.1 0.7 - 4.0 K/uL   Monocytes Relative 8 %   Monocytes Absolute 0.5 0.1 - 1.0 K/uL   Eosinophils Relative 3 %   Eosinophils Absolute 0.2 0.0 - 0.5 K/uL   Basophils Relative 1 %   Basophils Absolute 0.0 0.0 - 0.1 K/uL   Immature Granulocytes 1 %   Abs Immature Granulocytes 0.05 0.00 - 0.07 K/uL      RADIOGRAPHIC STUDIES:  I have personally reviewed the radiological images as listed and agree with the findings in the report.  DG Chest Port 1 View CLINICAL DATA:  Status post bronchoscopic biopsy  EXAM: PORTABLE CHEST - 1 VIEW  COMPARISON:  02/02/2023  FINDINGS: Cardiomediastinal silhouette and pulmonary vasculature are within normal limits.  No pneumothorax. Spiculated opacity seen at the RIGHT lung apex. Nodular opacity with marker noted in the RIGHT suprahilar lung. Additional marker seen in the LEFT upper lung is unchanged from prior exam.  IMPRESSION: No pneumothorax status post bronchoscopic biopsy.  Electronically Signed   By: Aliene Lloyd M.D.   On: 02/02/2024 14:27 DG C-Arm 1-60 Min-No Report Fluoroscopy was utilized by the requesting physician.  No radiographic  interpretation.  DG C-ARM BRONCHOSCOPY C-ARM BRONCHOSCOPY:  Fluoroscopy was utilized by the requesting physician.  No radiographic  interpretation.    CODE STATUS:  Code Status History     Date Active Date Inactive Code Status Order ID Comments User Context   02/02/2023 0033 02/02/2023 2226 DNR [id number]  Ricky Alfrieda DASEN, DO ED    Questions for Most Recent Historical Code Status (Order [id number])     Question Answer   If patient has no pulse and is not breathing Do Not Attempt Resuscitation   If patient has a pulse and/or is breathing: Medical Treatment Goals LIMITED ADDITIONAL INTERVENTIONS: Use medication/IV fluids and cardiac monitoring as indicated; Do not use intubation or mechanical ventilation (DNI), also provide comfort medications.  Transfer to Progressive/Stepdown as  indicated, avoid Intensive Care.   Consent: Discussion documented in EHR or advanced directives reviewed            No orders of the defined types were placed in this encounter.    Future Appointments  Date Time Provider Department Center  06/10/2024  9:00 AM [facility] LAB [facility] None  06/10/2024  9:45 AM [facility] INFUSION [facility] None      This document was completed utilizing speech recognition software. Grammatical errors, random word insertions, pronoun errors, and incomplete sentences are an occasional consequence of this system due to software limitations, ambient noise, and hardware issues. Any formal questions or concerns about the content, text or information contained within the body of this dictation should be directly addressed to the provider for clarification.

## 2024-05-20 NOTE — Patient Instructions (Signed)
 [facility] - A DEPT OF [facility]  Discharge Instructions: Thank you for choosing [facility] to provide your oncology and hematology care.   If you have a lab appointment with the Cancer Center, please go directly to the Cancer Center and check in at the registration area.   Wear comfortable clothing and clothing appropriate for easy access to any Portacath or PICC line.   We strive to give you quality time with your provider. You may need to reschedule your appointment if you arrive late (15 or more minutes).  Arriving late affects you and other patients whose appointments are after yours.  Also, if you miss three or more appointments without notifying the office, you may be dismissed from the clinic at the provider's discretion.      For prescription refill requests, have your pharmacy contact our office and allow 72 hours for refills to be completed.    Today you received the following chemotherapy and/or immunotherapy agents: pembrolizumab [facility])      To help prevent nausea and vomiting after your treatment, we encourage you to take your nausea medication as directed.  BELOW ARE SYMPTOMS THAT SHOULD BE REPORTED IMMEDIATELY: *FEVER GREATER THAN 100.4 F (38 C) OR HIGHER *CHILLS OR SWEATING *NAUSEA AND VOMITING THAT IS NOT CONTROLLED WITH YOUR NAUSEA MEDICATION *UNUSUAL SHORTNESS OF BREATH *UNUSUAL BRUISING OR BLEEDING *URINARY PROBLEMS (pain or burning when urinating, or frequent urination) *BOWEL PROBLEMS (unusual diarrhea, constipation, pain near the anus) TENDERNESS IN MOUTH AND THROAT WITH OR WITHOUT PRESENCE OF ULCERS (sore throat, sores in mouth, or a toothache) UNUSUAL RASH, SWELLING OR PAIN  UNUSUAL VAGINAL DISCHARGE OR ITCHING   Items with * indicate a potential emergency and should be followed up as soon as possible or go to the Emergency Department if any problems should occur.  Please show the CHEMOTHERAPY ALERT CARD or  IMMUNOTHERAPY ALERT CARD at check-in to the Emergency Department and triage nurse.  Should you have questions after your visit or need to cancel or reschedule your appointment, please contact [facility] - A DE[facility]  Dept: [phone]  and follow the prompts.  Office hours are 8:00 a.m. to 4:30 p.m. Monday - Friday. Please note that voicemails left after 4:00 p.m. may not be returned until the following business day.  We are closed weekends and major holidays. You have access to a nurse at all times for urgent questions. Please call the main number to the clinic Dept: [phone] and follow the prompts.   For any non-urgent questions, you may also contact your provider using MyChart. We now offer e-Visits for anyone 31 and older to request care online for non-urgent symptoms. For details visit [email or web address].   Also download the MyChart app! Go to the app store, search "MyChart", open the app, select [facility], and log in with your MyChart username and password.

## 2024-05-25 ENCOUNTER — Other Ambulatory Visit: Payer: Self-pay

## 2024-06-10 ENCOUNTER — Inpatient Hospital Stay: Admitting: Nutrition

## 2024-06-10 ENCOUNTER — Inpatient Hospital Stay: Attending: Oncology

## 2024-06-10 VITALS — BP 160/77 | HR 72 | Temp 97.7°F | Resp 16 | Wt 125.0 lb

## 2024-06-10 DIAGNOSIS — C321 Malignant neoplasm of supraglottis: Secondary | ICD-10-CM

## 2024-06-10 DIAGNOSIS — Z5112 Encounter for antineoplastic immunotherapy: Secondary | ICD-10-CM | POA: Insufficient documentation

## 2024-06-10 DIAGNOSIS — C7802 Secondary malignant neoplasm of left lung: Secondary | ICD-10-CM | POA: Diagnosis not present

## 2024-06-10 DIAGNOSIS — Z79620 Long term (current) use of immunosuppressive biologic: Secondary | ICD-10-CM | POA: Diagnosis not present

## 2024-06-10 LAB — CBC WITH DIFFERENTIAL (CANCER CENTER ONLY)
Abs Immature Granulocytes: 0.03 K/uL (ref 0.00–0.07)
Basophils Absolute: 0 K/uL (ref 0.0–0.1)
Basophils Relative: 0 %
Eosinophils Absolute: 0.5 K/uL (ref 0.0–0.5)
Eosinophils Relative: 5 %
HCT: 38.3 % — ABNORMAL LOW (ref 39.0–52.0)
Hemoglobin: 13.1 g/dL (ref 13.0–17.0)
Immature Granulocytes: 0 %
Lymphocytes Relative: 24 %
Lymphs Abs: 2.4 K/uL (ref 0.7–4.0)
MCH: 33 pg (ref 26.0–34.0)
MCHC: 34.2 g/dL (ref 30.0–36.0)
MCV: 96.5 fL (ref 80.0–100.0)
Monocytes Absolute: 0.6 K/uL (ref 0.1–1.0)
Monocytes Relative: 6 %
Neutro Abs: 6.3 K/uL (ref 1.7–7.7)
Neutrophils Relative %: 65 %
Platelet Count: 155 K/uL (ref 150–400)
RBC: 3.97 MIL/uL — ABNORMAL LOW (ref 4.22–5.81)
RDW: 12.4 % (ref 11.5–15.5)
WBC Count: 9.8 K/uL (ref 4.0–10.5)
nRBC: 0 % (ref 0.0–0.2)

## 2024-06-10 LAB — CMP (CANCER CENTER ONLY)
ALT: 7 U/L (ref 0–44)
AST: 25 U/L (ref 15–41)
Albumin: 3.9 g/dL (ref 3.5–5.0)
Alkaline Phosphatase: 74 U/L (ref 38–126)
Anion gap: 10 (ref 5–15)
BUN: 14 mg/dL (ref 8–23)
CO2: 24 mmol/L (ref 22–32)
Calcium: 8.7 mg/dL — ABNORMAL LOW (ref 8.9–10.3)
Chloride: 105 mmol/L (ref 98–111)
Creatinine: 0.8 mg/dL (ref 0.61–1.24)
GFR, Estimated: >60 mL/min
Glucose, Bld: 143 mg/dL — ABNORMAL HIGH (ref 70–99)
Potassium: 3.9 mmol/L (ref 3.5–5.1)
Sodium: 139 mmol/L (ref 135–145)
Total Bilirubin: 0.6 mg/dL (ref 0.0–1.2)
Total Protein: 6.5 g/dL (ref 6.5–8.1)

## 2024-06-10 LAB — TSH: TSH: 13.8 u[IU]/mL — ABNORMAL HIGH (ref 0.350–4.500)

## 2024-06-10 MED ORDER — SODIUM CHLORIDE 0.9 % IV SOLN
200.0000 mg | Freq: Once | INTRAVENOUS | Status: AC
  Administered 2024-06-10: 200 mg via INTRAVENOUS
  Filled 2024-06-10: qty 200

## 2024-06-10 MED ORDER — SODIUM CHLORIDE 0.9 % IV SOLN: INTRAVENOUS | Status: DC

## 2024-06-10 NOTE — Progress Notes (Signed)
 Nutrition follow-up completed with patient and grandson during infusion.  Patient is receiving pembrolizumab  every 21 days for recurrent SCC of supraglottis with lung metastases.  Weight: 125 pounds January 2 118.8 pounds December 12 121.3 pounds November 21  Labs include glucose 143  Patient irritated that treatment has not been started yet.  Grandson reports he feels patient has been eating better.  He did not know anything about a prescription for Megace .  He has been drinking vanilla Ensure per grandson.  Denies nutrition impact symptoms.  Weight is now within patient's usual body weight  Nutrition diagnosis:  Inadequate oral intake, improved Severe malnutrition, continues.  Intervention: Continue strategies for increased calories and protein.   Continue oral nutrition supplements as needed. Grandson will check with pharmacy about Megace  prescription.  Monitoring, evaluation, goals: Tolerate adequate calories and protein to minimize further weight loss.  Next visit: Will schedule follow-up as needed with upcoming treatment.  **Disclaimer: This note was dictated with voice recognition software. Similar sounding words can inadvertently be transcribed and this note may contain transcription errors which may not have been corrected upon publication of note.**

## 2024-06-10 NOTE — Patient Instructions (Signed)

## 2024-06-11 LAB — T4: T4, Total: 6.8 ug/dL (ref 4.5–12.0)

## 2024-06-12 ENCOUNTER — Other Ambulatory Visit: Payer: Self-pay

## 2024-06-15 ENCOUNTER — Other Ambulatory Visit: Payer: Self-pay

## 2024-06-15 DIAGNOSIS — R630 Anorexia: Secondary | ICD-10-CM

## 2024-06-15 DIAGNOSIS — C321 Malignant neoplasm of supraglottis: Secondary | ICD-10-CM

## 2024-06-15 DIAGNOSIS — C3411 Malignant neoplasm of upper lobe, right bronchus or lung: Secondary | ICD-10-CM

## 2024-06-15 DIAGNOSIS — Z85819 Personal history of malignant neoplasm of unspecified site of lip, oral cavity, and pharynx: Secondary | ICD-10-CM

## 2024-06-21 ENCOUNTER — Other Ambulatory Visit: Payer: Self-pay

## 2024-06-22 ENCOUNTER — Other Ambulatory Visit: Payer: Self-pay | Admitting: Radiology
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
## 2024-06-23 ENCOUNTER — Other Ambulatory Visit: Payer: Self-pay | Admitting: Radiology

## 2024-06-24 ENCOUNTER — Encounter (HOSPITAL_COMMUNITY): Payer: Self-pay

## 2024-06-24 ENCOUNTER — Ambulatory Visit (HOSPITAL_COMMUNITY)
Admission: RE | Admit: 2024-06-24 | Discharge: 2024-06-24 | Disposition: A | Discharge status: Home / Self Care | Source: Ambulatory Visit | Attending: Oncology | Admitting: Oncology

## 2024-06-24 ENCOUNTER — Other Ambulatory Visit: Payer: Self-pay

## 2024-06-24 DIAGNOSIS — R630 Anorexia: Secondary | ICD-10-CM | POA: Insufficient documentation

## 2024-06-24 DIAGNOSIS — Z800 Family history of malignant neoplasm of digestive organs: Secondary | ICD-10-CM | POA: Diagnosis not present

## 2024-06-24 DIAGNOSIS — Z801 Family history of malignant neoplasm of trachea, bronchus and lung: Secondary | ICD-10-CM | POA: Insufficient documentation

## 2024-06-24 DIAGNOSIS — C321 Malignant neoplasm of supraglottis: Secondary | ICD-10-CM | POA: Insufficient documentation

## 2024-06-24 DIAGNOSIS — F17210 Nicotine dependence, cigarettes, uncomplicated: Secondary | ICD-10-CM | POA: Diagnosis not present

## 2024-06-24 DIAGNOSIS — Z808 Family history of malignant neoplasm of other organs or systems: Secondary | ICD-10-CM | POA: Diagnosis not present

## 2024-06-24 DIAGNOSIS — C3411 Malignant neoplasm of upper lobe, right bronchus or lung: Secondary | ICD-10-CM | POA: Diagnosis not present

## 2024-06-24 DIAGNOSIS — Z85819 Personal history of malignant neoplasm of unspecified site of lip, oral cavity, and pharynx: Secondary | ICD-10-CM

## 2024-06-24 HISTORY — PX: IR IMAGING GUIDED PORT INSERTION: IMG5740

## 2024-06-24 MED ORDER — FENTANYL CITRATE (PF) 100 MCG/2ML IJ SOLN
INTRAMUSCULAR | Status: AC
  Filled 2024-06-24: qty 2

## 2024-06-24 MED ORDER — HEPARIN SOD (PORK) LOCK FLUSH 100 UNIT/ML IV SOLN
500.0000 [IU] | Freq: Once | INTRAVENOUS | Status: AC
  Administered 2024-06-24: 500 [IU] via INTRAVENOUS

## 2024-06-24 MED ORDER — LIDOCAINE HCL 1 % IJ SOLN
20.0000 mL | Freq: Once | INTRAMUSCULAR | Status: AC
  Administered 2024-06-24: 19 mL via INTRADERMAL

## 2024-06-24 MED ORDER — MIDAZOLAM HCL (PF) 2 MG/2ML IJ SOLN
INTRAMUSCULAR | Status: AC | PRN
  Administered 2024-06-24: 1 mg via INTRAVENOUS

## 2024-06-24 MED ORDER — LIDOCAINE HCL 1 % IJ SOLN
INTRAMUSCULAR | Status: AC
  Filled 2024-06-24: qty 20

## 2024-06-24 MED ORDER — FENTANYL CITRATE (PF) 100 MCG/2ML IJ SOLN
INTRAMUSCULAR | Status: AC | PRN
  Administered 2024-06-24: 50 ug via INTRAVENOUS

## 2024-06-24 MED ORDER — SODIUM CHLORIDE 0.9 % IV SOLN: INTRAVENOUS | Status: DC

## 2024-06-24 MED ORDER — MIDAZOLAM HCL 2 MG/2ML IJ SOLN
INTRAMUSCULAR | Status: AC
  Filled 2024-06-24: qty 2

## 2024-06-24 MED ORDER — HEPARIN SOD (PORK) LOCK FLUSH 100 UNIT/ML IV SOLN
INTRAVENOUS | Status: AC
  Filled 2024-06-24: qty 5

## 2024-06-24 NOTE — Discharge Instructions (Signed)
 Please call Interventional Radiology clinic [phone] with any questions or concerns.  You may remove your dressing and shower tomorrow.  After the procedure, it is common to have: Discomfort at the port insertion site. Bruising on the skin over the port. This should improve over 3-4 days  Follow these instructions at home:  Medication: Do not use Aspirin or ibuprofen products, such as Advil or Motrin, as it may increase bleeding.  You may resume your usual medications as ordered by your doctor. If your doctor prescribed antibiotics, take them as directed. Do not stop taking them just because you feel better. You need to take the full course of antibiotics.  Eating and drinking: Drink plenty of liquids to keep your urine pale yellow You can resume your regular diet as directed by your doctor   Care of the procedure site Follow instructions from your health care provider about how to take care of your port insertion site. Make sure you: After your port is placed, you will get a manufacturer's information card. The card has information about your port. Keep this card with you at all times Make sure to remember what type of port you have Take care of the port as told by your health care provider DO NOT use EMLA cream for 2 weeks after port placement -the cream will remove surgical glue on your incision Wash your hands with soap and water before and after you change your bandage (dressing). If soap and water are not available, use hand sanitizer Change your dressing as told by your health care provider Leave skin glue, or adhesive strips in place. These skin closures may need to stay in place for 2 weeks or longer Check your port insertion site every day for signs of infection. Check for: Redness, swelling, or pain Fluid or blood Warmth Pus or a bad smell  Activity Return to your normal activities as told by your health care provider. Ask your health care provider what activities  are safe for you Do not lift anything that is heavier than 10 lb (4.5 kg), or the limit that you are told, until your health care provider says that it is safe Do not take baths, swim, or use a hot tub until your health care provider approves. Take showers only. Keep all follow-up visits as told by your doctor  Contact a health care provider if: You cannot flush your port with saline as directed, or you cannot draw blood from the port You have a fever or chills You have redness, swelling, or pain around your port insertion site You have fluid or blood coming from your port insertion site Your port insertion site feels warm to the touch You have pus or a bad smell coming from the port insertion site  Get help right away if: You have chest pain or shortness of breath You have bleeding from your port that you cannot control   Moderate Conscious Sedation-Care After  This sheet gives you information about how to care for yourself after your procedure. Your health care provider may also give you more specific instructions. If you have problems or questions, contact your health care provider.  After the procedure, it is common to have: Sleepiness for several hours. Impaired judgment for several hours. Difficulty with balance. Vomiting if you eat too soon.  Follow these instructions at home:  Rest. Do not participate in activities where you could fall or become injured. Do not drive or use machinery. Do not drink alcohol. Do not  take sleeping pills or medicines that cause drowsiness. Do not make important decisions or sign legal documents. Do not take care of children on your own.  Eating and drinking Follow the diet recommended by your health care provider. Drink enough fluid to keep your urine pale yellow. If you vomit: Drink water, juice, or soup when you can drink without vomiting. Make sure you have little or no nausea before eating solid foods.  General instructions Take  over-the-counter and prescription medicines only as told by your health care provider. Have a responsible adult stay with you for the time you are told. It is important to have someone help care for you until you are awake and alert. Do not smoke. Keep all follow-up visits as told by your health care provider. This is important.  Contact a health care provider if: You are still sleepy or having trouble with balance after 24 hours. You feel light-headed. You keep feeling nauseous or you keep vomiting. You develop a rash. You have a fever. You have redness or swelling around the IV site.  Get help right away if: You have trouble breathing. You have new-onset confusion at home.  This information is not intended to replace advice given to you by your health care provider. Make sure you discuss any questions you have with your healthcare provider.

## 2024-06-24 NOTE — Procedures (Signed)
 Interventional Radiology Procedure Note  Procedure: Single Lumen Power Port Placement    Access:  Right IJ vein.  Findings: Catheter tip positioned at SVC/RA junction. Port is ready for immediate use.   Complications: None  EBL: < 10 mL  Recommendations:  - Ok to shower in 24 hours - Do not submerge for 7 days - Routine line care   Maxi Carreras T. Fredia Sorrow, M.D Pager:  [phone]

## 2024-07-01 ENCOUNTER — Inpatient Hospital Stay

## 2024-07-01 ENCOUNTER — Inpatient Hospital Stay: Admitting: Oncology

## 2024-07-01 ENCOUNTER — Encounter: Payer: Self-pay | Admitting: Oncology

## 2024-07-01 VITALS — BP 132/89 | HR 84 | Temp 97.2°F | Resp 18 | Wt 120.6 lb

## 2024-07-01 VITALS — BP 167/83 | HR 69 | Resp 16

## 2024-07-01 DIAGNOSIS — C321 Malignant neoplasm of supraglottis: Secondary | ICD-10-CM

## 2024-07-01 DIAGNOSIS — E038 Other specified hypothyroidism: Secondary | ICD-10-CM | POA: Diagnosis not present

## 2024-07-01 DIAGNOSIS — Z5112 Encounter for antineoplastic immunotherapy: Secondary | ICD-10-CM | POA: Diagnosis not present

## 2024-07-01 LAB — CMP (CANCER CENTER ONLY)
ALT: 8 U/L (ref 0–44)
AST: 24 U/L (ref 15–41)
Albumin: 4.1 g/dL (ref 3.5–5.0)
Alkaline Phosphatase: 81 U/L (ref 38–126)
Anion gap: 9 (ref 5–15)
BUN: 13 mg/dL (ref 8–23)
CO2: 26 mmol/L (ref 22–32)
Calcium: 8.7 mg/dL — ABNORMAL LOW (ref 8.9–10.3)
Chloride: 105 mmol/L (ref 98–111)
Creatinine: 0.85 mg/dL (ref 0.61–1.24)
GFR, Estimated: >60 mL/min
Glucose, Bld: 132 mg/dL — ABNORMAL HIGH (ref 70–99)
Potassium: 4.1 mmol/L (ref 3.5–5.1)
Sodium: 141 mmol/L (ref 135–145)
Total Bilirubin: 0.5 mg/dL (ref 0.0–1.2)
Total Protein: 6.7 g/dL (ref 6.5–8.1)

## 2024-07-01 LAB — CBC WITH DIFFERENTIAL (CANCER CENTER ONLY)
Abs Immature Granulocytes: 0.01 K/uL (ref 0.00–0.07)
Basophils Absolute: 0 K/uL (ref 0.0–0.1)
Basophils Relative: 1 %
Eosinophils Absolute: 0.3 K/uL (ref 0.0–0.5)
Eosinophils Relative: 4 %
HCT: 39 % (ref 39.0–52.0)
Hemoglobin: 13.6 g/dL (ref 13.0–17.0)
Immature Granulocytes: 0 %
Lymphocytes Relative: 37 %
Lymphs Abs: 2.4 K/uL (ref 0.7–4.0)
MCH: 32.8 pg (ref 26.0–34.0)
MCHC: 34.9 g/dL (ref 30.0–36.0)
MCV: 94 fL (ref 80.0–100.0)
Monocytes Absolute: 0.6 K/uL (ref 0.1–1.0)
Monocytes Relative: 9 %
Neutro Abs: 3.1 K/uL (ref 1.7–7.7)
Neutrophils Relative %: 49 %
Platelet Count: 158 K/uL (ref 150–400)
RBC: 4.15 MIL/uL — ABNORMAL LOW (ref 4.22–5.81)
RDW: 12.7 % (ref 11.5–15.5)
WBC Count: 6.4 K/uL (ref 4.0–10.5)
nRBC: 0 % (ref 0.0–0.2)

## 2024-07-01 MED ORDER — SODIUM CHLORIDE 0.9 % IV SOLN
200.0000 mg | Freq: Once | INTRAVENOUS | Status: AC
  Administered 2024-07-01: 200 mg via INTRAVENOUS
  Filled 2024-07-01: qty 200

## 2024-07-01 MED ORDER — SODIUM CHLORIDE 0.9 % IV SOLN: INTRAVENOUS | Status: DC

## 2024-07-01 NOTE — Assessment & Plan Note (Signed)
 Please review oncology history for additional details and timeline of events.  Initial diagnosis was in September 2022.  Treated with concurrent chemoradiation using weekly cisplatin  at that time.  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    Now with recurrence of disease in the left upper lobe and left lower lobe and also mediastinal lymphadenopathy.    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  Clinical picture is indicative of metastatic disease from head and neck primary.  Multiple nodules present in right upper, right lower, and left upper lobes.  PD-L1 TPS came back at 95%.  This makes him a candidate for single agent immunotherapy with pembrolizumab .  Previously provided information regarding pembrolizumab .  Discussed risk of autoimmune side effects and need for close monitoring.  Patient verbalized understanding and is agreeable to proceeding with pembrolizumab  treatments.  Started pembrolizumab  treatments from 04/29/2024.  Labs today reveal no dose-limiting toxicities.  Will proceed with pembrolizumab  today.  Plan is to continue this every 3 weeks indefinitely as long as there is clinical benefit.  RTC in 3 weeks for labs, follow-up and pembrolizumab  treatment.

## 2024-07-01 NOTE — Assessment & Plan Note (Signed)
 Chronic hypothyroidism requiring ongoing levothyroxine  replacement. Thyroid  function tests are within normal limits, but optimal control depends on proper medication administration. - Instructed him to take levothyroxine  25 mcg in the morning on an empty stomach, at least one hour before other medications, food, or drink, to maximize absorption and efficacy. - Provided written medication instructions. - Planned to monitor thyroid  function tests at future visits and adjust levothyroxine  dosage if thyroid  function remains suboptimal.

## 2024-07-01 NOTE — Patient Instructions (Signed)
 [facility] - A DEPT OF [facility]   Discharge Instructions: Thank you for choosing [facility] to provide your oncology and hematology care.   If you have a lab appointment with the Cancer Center, please go directly to the Cancer Center and check in at the registration area.   Wear comfortable clothing and clothing appropriate for easy access to any Portacath or PICC line.   We strive to give you quality time with your provider. You may need to reschedule your appointment if you arrive late (15 or more minutes).  Arriving late affects you and other patients whose appointments are after yours.  Also, if you miss three or more appointments without notifying the office, you may be dismissed from the clinic at the provider's discretion.      For prescription refill requests, have your pharmacy contact our office and allow 72 hours for refills to be completed.    Today you received the following chemotherapy and/or immunotherapy agents: Pembrolizumab Rande Lawman)      To help prevent nausea and vomiting after your treatment, we encourage you to take your nausea medication as directed.  BELOW ARE SYMPTOMS THAT SHOULD BE REPORTED IMMEDIATELY: *FEVER GREATER THAN 100.4 F (38 C) OR HIGHER *CHILLS OR SWEATING *NAUSEA AND VOMITING THAT IS NOT CONTROLLED WITH YOUR NAUSEA MEDICATION *UNUSUAL SHORTNESS OF BREATH *UNUSUAL BRUISING OR BLEEDING *URINARY PROBLEMS (pain or burning when urinating, or frequent urination) *BOWEL PROBLEMS (unusual diarrhea, constipation, pain near the anus) TENDERNESS IN MOUTH AND THROAT WITH OR WITHOUT PRESENCE OF ULCERS (sore throat, sores in mouth, or a toothache) UNUSUAL RASH, SWELLING OR PAIN  UNUSUAL VAGINAL DISCHARGE OR ITCHING   Items with * indicate a potential emergency and should be followed up as soon as possible or go to the Emergency Department if any problems should occur.  Please show the CHEMOTHERAPY ALERT CARD  or IMMUNOTHERAPY ALERT CARD at check-in to the Emergency Department and triage nurse.  Should you have questions after your visit or need to cancel or reschedule your appointment, please contact [facility] - A DEPT OF [facility]  Dept: [phone]  and follow the prompts.  Office hours are 8:00 a.m. to 4:30 p.m. Monday - Friday. Please note that voicemails left after 4:00 p.m. may not be returned until the following business day.  We are closed weekends and major holidays. You have access to a nurse at all times for urgent questions. Please call the main number to the clinic Dept: [phone] and follow the prompts.   For any non-urgent questions, you may also contact your provider using MyChart. We now offer e-Visits for anyone 75 and older to request care online for non-urgent symptoms. For details visit [email or web address].   Also download the MyChart app! Go to the app store, search "MyChart", open the app, select , and log in with your MyChart username and password.

## 2024-07-01 NOTE — Progress Notes (Signed)
 "  [facility]  ONCOLOGY CLINIC PROGRESS NOTE   Patient Care Team: Trudy Vaughan HERO, MD as PCP - General (Family Medicine) Izell Domino, MD as Attending Physician (Radiation Oncology) Malmfelt, Delon CROME, RN as Oncology Nurse Navigator Jesus Oliphant, MD as Consulting Physician (Otolaryngology) Autumn Millman, MD as Consulting Physician (Oncology)  PATIENT NAME: Donald Larson   MR#: [id number] DOB: Oct 27, 1945  Date of visit: 07/01/2024   ASSESSMENT & PLAN:   Donald Larson is a 79 y.o. 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  PD-L1 TPS of 95%.  Started treatment with pembrolizumab  from 04/29/2024.  Cancer of supraglottis [facility]) Please review oncology history for additional details and timeline of events.  Initial diagnosis was in September 2022.  Treated with concurrent chemoradiation using weekly cisplatin  at that time.  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    Now with recurrence of disease in the left upper lobe and left lower lobe and also mediastinal lymphadenopathy.    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  Clinical picture is indicative of metastatic disease from head and neck primary.  Multiple nodules present in right upper, right lower, and left upper lobes.  PD-L1 TPS came back at 95%.  This makes him a candidate for single agent immunotherapy with pembrolizumab .  Previously provided information regarding pembrolizumab .  Discussed risk of autoimmune side effects and need for close monitoring.  Patient verbalized understanding and is agreeable to proceeding with pembrolizumab  treatments.  Started pembrolizumab  treatments from 04/29/2024.  Labs today reveal no dose-limiting toxicities.  Will  proceed with pembrolizumab  today.  Plan is to continue this every 3 weeks indefinitely as long as there is clinical benefit.  RTC in 3 weeks for labs, follow-up and pembrolizumab  treatment.  Other specified hypothyroidism Chronic hypothyroidism requiring ongoing levothyroxine  replacement. Thyroid  function tests are within normal limits, but optimal control depends on proper medication administration. - Instructed him to take levothyroxine  25 mcg in the morning on an empty stomach, at least one hour before other medications, food, or drink, to maximize absorption and efficacy. - Provided written medication instructions. - Planned to monitor thyroid  function tests at future visits and adjust levothyroxine  dosage if thyroid  function remains suboptimal.   I reviewed lab results and outside records for this visit and discussed relevant results with the patient. Diagnosis, plan of care and treatment options were also discussed in detail with the patient. Opportunity provided to ask questions and answers provided to his apparent satisfaction. Provided instructions to call our clinic with any problems, questions or concerns prior to return visit. I recommended to continue follow-up with PCP and sub-specialists. He verbalized understanding and agreed with the plan.   NCCN guidelines have been consulted in the planning of this patients care.  I spent a total of 42 minutes during this encounter with the patient including review of chart and various tests results, discussions about plan of care and coordination of care plan.   Millman Autumn, MD  07/01/2024 12:26 PM  [facility] - A DEPT [facility] [address] [place] [address] Dept: [phone] Dept Fax: [phone]    CHIEF COMPLAINT/ REASON FOR VISIT:   Metastatic squamous cell carcinoma of the supraglottis with biopsy-proven lung mets.  PD-L1 TPS of 95%  Current  Treatment: Started pembrolizumab  treatments from 04/29/2024.  INTERVAL HISTORY:    Discussed the use of AI scribe software for clinical note transcription with the patient, who gave verbal consent to proceed.  History of Present Illness  Donald Larson is a 79 year old male with supraglottic malignancy who presents for follow-up and ongoing oncologic therapy.  He is currently receiving regular treatment for supraglottic malignancy and reports no complications. He is able to tolerate oral intake without dysphagia, nausea, or vomiting, and describes his overall condition as very good.  A new venous access port was placed last week for continued therapy and was successfully accessed for today's treatment. He notes the port placement procedure was extensive, but the device is functioning appropriately.  He is prescribed levothyroxine  for hypothyroidism. He takes two to three other medications in the morning with assistance from his grandson.    I have reviewed the past medical history, past surgical history, social history and family history with the patient and they are unchanged from previous note.  HISTORY OF PRESENT ILLNESS:   ONCOLOGY HISTORY:   He initially presented with a right neck mass for the last few months.  He was evaluated by Dr. Mable in September 2022 found to have an epiglottic mass measuring 2.2 cm with right-sided lymph node involvement.  Directed endoscopy at that time revealed a soft tissue mass in the right aryepiglottic fold and neck mass measuring 2 cm on examination.  Biopsy at that time revealed a moderately differentiated invasive squamous cell carcinoma with fine-needle aspiration of the right supraglottic neck mass revealed malignant squamous cell carcinoma.  PET scan obtained at that time which showed a hypermetabolic uptake in the right supraglottic in the right dominant neck node.    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 The patient initially was treated at 2 Gray per fraction.  He received 46 Gray in 23 fractions.  He then expressed reluctance to complete treatment.  He did not show for appointments as scheduled. Therefore upon further discussion with Dr. Izell, he agreed to continue treatment at a hypofractionated technique so that he could come in for less visits.  He tolerated an additional 6 fractions at 2.7 Gray per fraction to yield 16.2 Elnor more.   Total dose received was 62.2 Gray in 29 fractions.  He refused further treatment after this point in time. His treatment elapsed over 2 months with breaks that were against medical advice.   Radiation therapy and weekly cisplatin  started on May 22, 2021.  Therapy concluded in January 2023.    PET scan obtained in May 2023 did not show any evidence of disease relapse.    CT chest in November 2023 showed 9 mm left upper lobe lung nodule which was increased in size compared to prior.   He underwent fiberoptic bronchoscopy in May 2024 and biopsy of the right upper lobe lung nodule and also of the left upper lobe lung nodule showed non-small cell carcinoma.  Tumor cells were strongly positive for p40 and CK5/6 and negative for TTF-1, favoring squamous cell carcinoma.   It was felt to be bilateral stage I primary lung cancers versus bilateral metastasis to the lungs.   Patient opted to proceed with SBRT to both lung lesions.    He continued to follow-up with Dr. Izell. On 11/27/2023, follow-up PET scan showed  intense hypermetabolic right infrahilar lymph node consistent with nodal metastasis and new small hypermetabolic pulmonary nodules in the right upper lobe and right lower lobe consistent with lung metastasis.  Enlarging hypermetabolic pleural-based nodule in the left upper lobe consistent with pulmonary metastasis.   On 02/02/2024, Dr. Shelah  performed biopsy of left lower lobe lung nodule which came back positive for squamous cell carcinoma.  Brushings from left upper lobe also showed findings consistent with squamous cell carcinoma.   At this point, he was referred to us  for consideration of systemic treatments   We requested PD-L1 testing on the specimen.    PD-L1 TPS came back at 95%.  This makes him a candidate for single agent immunotherapy with pembrolizumab .  Started pembrolizumab  from 04/29/2024.  Plan to continue every 3 weeks indefinitely   Oncology History  Cancer of supraglottis (HCC)  04/30/2021 Initial Diagnosis   Cancer of supraglottis (HCC)   04/30/2021 Cancer Staging   Staging form: Larynx - Supraglottis, AJCC 8th Edition - Clinical stage from 04/30/2021: Stage IVA (cT1, cN2a, cM0) - Signed by Izell Domino, MD on 04/30/2021 Stage prefix: Initial diagnosis   05/22/2021 - 06/19/2021 Chemotherapy   Patient is on Treatment Plan : HEAD/NECK Cisplatin  q7d     04/29/2024 -  Chemotherapy   Patient is on Treatment Plan : HEAD/NECK Pembrolizumab  (200) q21d         REVIEW OF SYSTEMS:   Review of Systems - Oncology  All other pertinent systems were reviewed with the patient and are negative.  ALLERGIES: He has no known allergies.  MEDICATIONS:  Current Outpatient Medications  Medication Sig Dispense Refill   amLODipine (NORVASC) 10 MG tablet Take 10 mg by mouth daily.     Aspirin-Caffeine (BC FAST PAIN RELIEF PO) Take 2 packets by mouth daily as needed (pain).     gabapentin  (NEURONTIN ) 300 MG capsule Take 300 mg by mouth daily.     hydrochlorothiazide  (HYDRODIURIL ) 12.5 MG tablet Take 12.5 mg by mouth daily.     levothyroxine  (SYNTHROID ) 25 MCG tablet Take 1 tablet PO on empty stomach every morning, 1 hour before other medications, vitamins, coffee, or breakfast. 90 tablet 3   megestrol  (MEGACE  ES) 625 MG/5ML suspension Take 5 mLs (625 mg total) by mouth daily. 150 mL 0   omeprazole  (PRILOSEC) 20 MG  capsule Take 20 mg by mouth daily.     ondansetron  (ZOFRAN ) 4 MG tablet Take 4 mg by mouth every 8 (eight) hours as needed for nausea or vomiting.     ondansetron  (ZOFRAN ) 8 MG tablet Take 1 tablet (8 mg total) by mouth every 8 (eight) hours as needed for nausea or vomiting. 30 tablet 1   ondansetron  (ZOFRAN -ODT) 8 MG disintegrating tablet Take 8 mg by mouth 2 (two) times daily as needed.     Oxycodone  HCl 10 MG TABS Take 10 mg by mouth every 6 (six) hours as needed (pain).     prochlorperazine  (COMPAZINE ) 10 MG tablet Take 1 tablet (10 mg total) by mouth every 6 (six) hours as needed for nausea or vomiting. 30 tablet 1   traZODone (DESYREL) 50 MG tablet Take 50 mg by mouth at bedtime as needed for sleep.     No current facility-administered medications for this visit.   Facility-Administered Medications Ordered in Other Visits  Medication Dose Route Frequency Provider Last Rate Last Admin   0.9 %  sodium chloride  infusion   Intravenous Continuous Farhaan Mabee, MD   Stopped at 07/01/24 1136  VITALS:   Blood pressure 132/89, pulse 84, temperature (!) 97.2 F (36.2 C), temperature source Temporal, resp. rate 18, weight 120 lb 9 oz (54.7 kg), SpO2 97%.  Wt Readings from Last 3 Encounters:  07/01/24 120 lb 9 oz (54.7 kg)  06/24/24 125 lb (56.7 kg)  06/10/24 125 lb (56.7 kg)    Body mass index is 18.33 kg/m.   Onc Performance Status - 07/01/24 1021       ECOG Perf Status   ECOG Perf Status Restricted in physically strenuous activity but ambulatory and able to carry out work of a light or sedentary nature, e.g., light house work, office work      KPS SCALE   KPS % SCORE Normal activity with effort, some s/s of disease           PHYSICAL EXAM:   Physical Exam Constitutional:      General: He is not in acute distress.    Appearance: Normal appearance.  HENT:     Head: Normocephalic and atraumatic.  Eyes:     Comments: Left eye cataract noted  Cardiovascular:     Rate  and Rhythm: Normal rate and regular rhythm.  Pulmonary:     Effort: Pulmonary effort is normal. No respiratory distress.  Abdominal:     General: There is no distension.  Neurological:     General: No focal deficit present.     Mental Status: He is alert and oriented to person, place, and time.  Psychiatric:        Mood and Affect: Mood normal.        Behavior: Behavior normal.      LABORATORY DATA:   I have reviewed the data as listed.  Results for orders placed or performed in visit on 07/01/24  CMP (Cancer Center only)  Result Value Ref Range   Sodium 141 135 - 145 mmol/L   Potassium 4.1 3.5 - 5.1 mmol/L   Chloride 105 98 - 111 mmol/L   CO2 26 22 - 32 mmol/L   Glucose, Bld 132 (H) 70 - 99 mg/dL   BUN 13 8 - 23 mg/dL   Creatinine 9.14 9.38 - 1.24 mg/dL   Calcium 8.7 (L) 8.9 - 10.3 mg/dL   Total Protein 6.7 6.5 - 8.1 g/dL   Albumin 4.1 3.5 - 5.0 g/dL   AST 24 15 - 41 U/L   ALT 8 0 - 44 U/L   Alkaline Phosphatase 81 38 - 126 U/L   Total Bilirubin 0.5 0.0 - 1.2 mg/dL   GFR, Estimated >39 >39 mL/min   Anion gap 9 5 - 15  CBC with Differential (Cancer Center Only)  Result Value Ref Range   WBC Count 6.4 4.0 - 10.5 K/uL   RBC 4.15 (L) 4.22 - 5.81 MIL/uL   Hemoglobin 13.6 13.0 - 17.0 g/dL   HCT 60.9 60.9 - 47.9 %   MCV 94.0 80.0 - 100.0 fL   MCH 32.8 26.0 - 34.0 pg   MCHC 34.9 30.0 - 36.0 g/dL   RDW 87.2 88.4 - 84.4 %   Platelet Count 158 150 - 400 K/uL   nRBC 0.0 0.0 - 0.2 %   Neutrophils Relative % 49 %   Neutro Abs 3.1 1.7 - 7.7 K/uL   Lymphocytes Relative 37 %   Lymphs Abs 2.4 0.7 - 4.0 K/uL   Monocytes Relative 9 %   Monocytes Absolute 0.6 0.1 - 1.0 K/uL   Eosinophils Relative 4 %   Eosinophils Absolute  0.3 0.0 - 0.5 K/uL   Basophils Relative 1 %   Basophils Absolute 0.0 0.0 - 0.1 K/uL   Immature Granulocytes 0 %   Abs Immature Granulocytes 0.01 0.00 - 0.07 K/uL      RADIOGRAPHIC STUDIES:  I have personally reviewed the radiological images as listed  and agree with the findings in the report.  IR IMAGING GUIDED PORT INSERTION CLINICAL DATA:  Metastatic supraglottic squamous carcinoma of the larynx and need for porta cath for chemotherapy.  EXAM: IMPLANTED PORT A CATH PLACEMENT WITH ULTRASOUND AND FLUOROSCOPIC GUIDANCE  ANESTHESIA/SEDATION: Moderate (conscious) sedation was employed during this procedure. A total of Versed  2.0 mg and Fentanyl  100 mcg was administered intravenously.  Moderate Sedation Time: 38 minutes. The patient's level of consciousness and vital signs were monitored continuously by radiology nursing throughout the procedure under my direct supervision.  FLUOROSCOPY: Radiation Exposure Index: 2.0 mGy Kerma  PROCEDURE: The procedure, risks, benefits, and alternatives were explained to the patient. Questions regarding the procedure were encouraged and answered. The patient understands and consents to the procedure. A time-out was performed prior to initiating the procedure.  Ultrasound was utilized to confirm patency of the right internal jugular vein. An ultrasound image was saved and recorded. The right neck and chest were prepped with chlorhexidine  in a sterile fashion, and a sterile drape was applied covering the operative field. Maximum barrier sterile technique with sterile gowns and gloves were used for the procedure. Local anesthesia was provided with 1% lidocaine .  After creating a small venotomy incision, a 21 gauge needle was advanced into the right internal jugular vein under direct, real-time ultrasound guidance. Ultrasound image documentation was performed. After securing guidewire access, an 8 Fr dilator was placed. A J-wire was kinked to measure appropriate catheter length.  A subcutaneous port pocket was then created along the upper chest wall utilizing sharp and blunt dissection. Portable cautery was utilized. The pocket was irrigated with sterile saline.  A single lumen power  injectable port was chosen for placement. The 8 Fr catheter was tunneled from the port pocket site to the venotomy incision. The port was placed in the pocket. External catheter was trimmed to appropriate length based on guidewire measurement.  At the venotomy, an 8 Fr peel-away sheath was placed over a guidewire. The catheter was then placed through the sheath and the sheath removed. Final catheter positioning was confirmed and documented with a fluoroscopic spot image. The port was accessed with a needle and aspirated and flushed with heparinized saline. The access needle was removed.  The venotomy and port pocket incisions were closed with subcutaneous 3-0 Monocryl and subcuticular 4-0 Vicryl. Dermabond was applied to both incisions.  COMPLICATIONS: COMPLICATIONS None  FINDINGS: After catheter placement, the tip lies at the cavo-atrial junction. The catheter aspirates normally and is ready for immediate use.  IMPRESSION: Placement of single lumen port a cath via right internal jugular vein. The catheter tip lies at the cavo-atrial junction. A power injectable port a cath was placed and is ready for immediate use.  Electronically Signed   By: Marcey Moan M.D.   On: 06/24/2024 17:20    CODE STATUS:  Code Status History     Date Active Date Inactive Code Status Order ID Comments User Context   02/02/2023 0033 02/02/2023 2226 DNR [id number]  Ricky Alfrieda DASEN, DO ED    Questions for Most Recent Historical Code Status (Order [id number])     Question Answer   If patient has no pulse and  is not breathing Do Not Attempt Resuscitation   If patient has a pulse and/or is breathing: Medical Treatment Goals LIMITED ADDITIONAL INTERVENTIONS: Use medication/IV fluids and cardiac monitoring as indicated; Do not use intubation or mechanical ventilation (DNI), also provide comfort medications.  Transfer to Progressive/Stepdown as indicated, avoid Intensive Care.   Consent: Discussion  documented in EHR or advanced directives reviewed            Orders Placed This Encounter  Procedures   CBC with Differential (Cancer Center Only)    Standing Status:   Future    Expected Date:   07/22/2024    Expiration Date:   07/22/2025   CMP (Cancer Center only)    Standing Status:   Future    Expected Date:   07/22/2024    Expiration Date:   07/22/2025   CBC with Differential (Cancer Center Only)    Standing Status:   Future    Expected Date:   08/12/2024    Expiration Date:   08/12/2025   CMP (Cancer Center only)    Standing Status:   Future    Expected Date:   08/12/2024    Expiration Date:   08/12/2025   T4    Standing Status:   Future    Expected Date:   08/12/2024    Expiration Date:   08/12/2025   TSH    Standing Status:   Future    Expected Date:   08/12/2024    Expiration Date:   08/12/2025   CBC with Differential (Cancer Center Only)    Standing Status:   Future    Expected Date:   09/02/2024    Expiration Date:   09/02/2025   CMP (Cancer Center only)    Standing Status:   Future    Expected Date:   09/02/2024    Expiration Date:   09/02/2025   CBC with Differential (Cancer Center Only)    Standing Status:   Future    Expected Date:   09/23/2024    Expiration Date:   09/23/2025   CMP (Cancer Center only)    Standing Status:   Future    Expected Date:   09/23/2024    Expiration Date:   09/23/2025   CBC with Differential (Cancer Center Only)    Standing Status:   Future    Expected Date:   10/14/2024    Expiration Date:   10/14/2025   CMP (Cancer Center only)    Standing Status:   Future    Expected Date:   10/14/2024    Expiration Date:   10/14/2025   T4    Standing Status:   Future    Expected Date:   10/14/2024    Expiration Date:   10/14/2025   TSH    Standing Status:   Future    Expected Date:   10/14/2024    Expiration Date:   10/14/2025       This document was completed utilizing speech recognition software. Grammatical errors, random word insertions, pronoun errors, and  incomplete sentences are an occasional consequence of this system due to software limitations, ambient noise, and hardware issues. Any formal questions or concerns about the content, text or information contained within the body of this dictation should be directly addressed to the provider for clarification.   "

## 2024-07-04 ENCOUNTER — Other Ambulatory Visit: Payer: Self-pay

## 2024-07-22 ENCOUNTER — Inpatient Hospital Stay: Admitting: Dietician

## 2024-07-22 ENCOUNTER — Inpatient Hospital Stay: Attending: Oncology

## 2024-07-22 ENCOUNTER — Inpatient Hospital Stay

## 2024-07-22 ENCOUNTER — Inpatient Hospital Stay: Admitting: Oncology

## 2024-08-12 ENCOUNTER — Inpatient Hospital Stay

## 2024-08-12 ENCOUNTER — Inpatient Hospital Stay: Admitting: Oncology

## 2024-08-12 ENCOUNTER — Inpatient Hospital Stay: Attending: Oncology

## 2024-09-02 ENCOUNTER — Inpatient Hospital Stay

## 2024-09-02 ENCOUNTER — Inpatient Hospital Stay: Admitting: Oncology

## 2024-09-23 ENCOUNTER — Inpatient Hospital Stay: Admitting: Oncology

## 2024-09-23 ENCOUNTER — Inpatient Hospital Stay

## 2024-09-23 ENCOUNTER — Inpatient Hospital Stay: Attending: Oncology
